# Patient Record
Sex: Female | Born: 1955 | Race: Black or African American | Hispanic: No | Marital: Married | Smoking: Current some day smoker
Health system: Southern US, Community
[De-identification: ages and names within clinical notes are randomized; demographics above are authoritative.]

## PROBLEM LIST (undated history)

## (undated) DIAGNOSIS — E785 Hyperlipidemia, unspecified: Secondary | ICD-10-CM

## (undated) DIAGNOSIS — Z8619 Personal history of other infectious and parasitic diseases: Secondary | ICD-10-CM

## (undated) DIAGNOSIS — G709 Myoneural disorder, unspecified: Secondary | ICD-10-CM

## (undated) DIAGNOSIS — E1169 Type 2 diabetes mellitus with other specified complication: Secondary | ICD-10-CM

## (undated) DIAGNOSIS — I10 Essential (primary) hypertension: Secondary | ICD-10-CM

## (undated) DIAGNOSIS — Z720 Tobacco use: Secondary | ICD-10-CM

## (undated) DIAGNOSIS — E669 Obesity, unspecified: Secondary | ICD-10-CM

## (undated) HISTORY — DX: Personal history of other infectious and parasitic diseases: Z86.19

## (undated) HISTORY — PX: CHOLECYSTECTOMY: SHX55

## (undated) HISTORY — DX: Obesity, unspecified: E66.9

## (undated) HISTORY — DX: Hyperlipidemia, unspecified: E78.5

## (undated) HISTORY — PX: TOTAL ABDOMINAL HYSTERECTOMY: SHX209

## (undated) HISTORY — PX: ABDOMINAL HYSTERECTOMY: SHX81

## (undated) HISTORY — DX: Essential (primary) hypertension: I10

## (undated) HISTORY — PX: COLONOSCOPY: SHX174

## (undated) HISTORY — DX: Tobacco use: Z72.0

## (undated) HISTORY — DX: Myoneural disorder, unspecified: G70.9

## (undated) HISTORY — PX: APPENDECTOMY: SHX54

## (undated) HISTORY — DX: Type 2 diabetes mellitus with other specified complication: E11.69

---

## 2006-12-17 ENCOUNTER — Encounter: Payer: Self-pay | Admitting: Family Medicine

## 2006-12-17 ENCOUNTER — Other Ambulatory Visit: Admission: RE | Admit: 2006-12-17 | Discharge: 2006-12-17 | Payer: Self-pay | Admitting: Obstetrics and Gynecology

## 2006-12-17 ENCOUNTER — Ambulatory Visit: Payer: Self-pay | Admitting: Family Medicine

## 2006-12-31 ENCOUNTER — Ambulatory Visit: Payer: Self-pay | Admitting: Obstetrics & Gynecology

## 2010-09-17 NOTE — Group Therapy Note (Signed)
NAME:  Tiffany Estrada, Tiffany Estrada NO.:  [id number]   MEDICAL RECORD NO.:  [id number]          PATIENT TYPE:  WOC   LOCATION:  [facility]                   FACILITY:  [facility]   PHYSICIAN:  Johnella Moloney, MD        DATE OF BIRTH:  05-28-1955   DATE OF SERVICE:                                  CLINIC NOTE   CHIEF COMPLAINT:  Abnormal uterine bleeding, fibroids, follow up of  endometrial biopsy results.   HISTORY OF PRESENT ILLNESS:  The patient is a 55 year old G2, P2 who was  last seen on 12/17/2006 by Dr. Tinnie Gens for evaluation of abnormal  uterine bleeding and iron deficiency anemia. The patient has a history  of fibroid uterus. At her last visit she was examined and found to have  an 18 week sized fibroid uterus and an endometrial biopsy and a Pap  smear were done at this visit. The patient is here today to follow up  her results and to discuss further management options. She does report  having a small amount of bleeding after the procedure but has had no  further bleeding since then. She still reports having some pelvic  pressure which causes increased sensation to urinate but this has been  stable over several months. No interval change in medical history.   On physical examination vital signs are stable. Physical examination  deferred.   RESULTS:  Pap smear done on 12/17/2006 was negative for intra-epithelial  lesions or malignancy. Endometrial biopsy done was remarkable for  degenerating secretory type endometrium with tubal metaplasia and  hemorrhage.   ASSESSMENT/PLAN:  The patient is a 55 year old G2, P2 with a history of  fibroids, symptomatic fibroid uterus and menorrhagia.   Of note, the patient also has a history of an abnormal Pap smear in  December 2007 of LGSIL but had a negative colposcopy and now her most  recent Pap smear is negative. Results were discussed with the patient  including benign endometrial biopsy and the patient was reassured by  results.  Discussed further management of her menorrhagia including  medical management with hormones versus IUD therapy and surgical  management involving a total hysterectomy and BSO. The patient at this  point is unsure about what she wants to do about the menorrhagia and  wants to think about it for a little bit before making a final decision.  Discussed both the Mirena IUD and also surgical options. Discussed all  the risks, benefits, indications and alternatives of both kind of  treatments, emphasizing the surgical risks and anesthesia risks, length  of recovery after a total abdominal hysterectomy. Also stressed that  depending on who is going to do her surgery, the surgery will probably  most likely be an open exploratory laparotomy surgery and not a  minimally invasive surgery. The patient does understand the risks of  surgery and will make another appointment to finalize her decision about  further management of her menorrhagia.           ______________________________  Johnella Moloney, MD     UD/MEDQ  D:  12/31/2006  T:  01/01/2007  Job:  [id number]

## 2010-09-17 NOTE — Group Therapy Note (Signed)
NAME:  Tiffany Estrada, Tiffany Estrada NO.:  [id number]   MEDICAL RECORD NO.:  [id number]          PATIENT TYPE:  WOC   LOCATION:  [facility]                   FACILITY:  [facility]   PHYSICIAN:  Tinnie Gens, MD        DATE OF BIRTH:  1956-03-15   DATE OF SERVICE:  12/17/2006                                  CLINIC NOTE   CHIEF COMPLAINT:  Abnormal uterine bleeding.   HISTORY OF PRESENT ILLNESS:  The patient is a 55 year old gravida 2,  para 2-0-0-2 who is referred from [facility] for abnormal uterine bleeding and iron deficiency anemia.  The  patient also has a history of fibroid uterus.  She notes that she was  told that approximately 18 years ago that she has fibroid uterus and  that they would go down after her delivery of her last child.  The  patient reports her bleeding is worse.  Her sensation of pelvic pressure  and pain has gotten more involved.  She reports that she was told that  her fibroids are growing.  She normally has monthly cycles that are  exceptionally heavy.  She is going through what she calls a pack of pads  a day.  She requires iron and multivitamin to keep her hemoglobin stable  at approximately 9.  LMP was May of 2008.  She did have some spotting in  June.   PAST MEDICAL HISTORY:  Negative.   PAST SURGICAL HISTORY:  Cholecystectomy 1999, appendectomy 1990, BTL  1992.   MEDICATIONS:  Iron and multivitamins.   ALLERGIES:  Latex, PENICILLIN, and SHELLFISH.   OBSTETRICAL HISTORY:  She is G2, P2, with 2 vaginal deliveries.   GYN HISTORY:  Menarche at age 62.  Cycles are monthly, last 5-10 days  with heavy flow and moderate pain.  Some bleeding between periods.  She  does have a history of abnormal Pap and underwent colposcopy in December  2007.  She has not had followup since that time.  The colposcopy  resulted in no biopsies as no lesions were seen.   FAMILY HISTORY:  Significant for diabetes and hypertension.   SOCIAL  HISTORY:  She does smoke approximately one-half pack per day.  No  alcohol use.  No other drug use.   A 14-point review of systems is reviewed and is diffusely positive for  swelling in the legs, muscle aches, fevers, night sweats, fatigue,  weight gain, headaches, dizzy spells, problems with hearing, nausea,  vomiting, and hot flashes.  Most of this will be covered by her primary  care physician.   EXAM:  Blood pressure is 135/88, weight is 184, a pulse 84, height 5  feet 7 inches.  She is a well-nourished female in no acute distress.  ABDOMEN:  Soft with a mass in the lower abdomen.  GU:  Normal external female genitalia.  Vagina is pink and rugated.  Cervix is parous without lesions.  Uterus is firm and approximately 18  weeks size.  Adnexa cannot be appreciated.   PROCEDURE:  Cervix is cleaned with Betadine x2, grasped anteriorly with  a single-tooth tenaculum, sounded to approximately 11 cm.  An  endometrial sampling was done.  Patient tolerated procedure well.   IMPRESSION:  1. Abnormal uterine bleeding.  2. Have a history of abnormal Pap.  3. Fibroid uterus  4. Iron deficiency anemia.   PLAN:  1. Pap smear today.  2. Endometrial sampling today.  3. Follow up in 2 weeks for results and then discussion of treatment      options, which is probably and mostly would include a TAH.  Thank      you so much for referring this patient.  We will keep you updated      as to her progress.           ______________________________  Tinnie Gens, MD     TP/MEDQ  D:  12/17/2006  T:  12/18/2006  Job:  [id number]

## 2012-03-31 ENCOUNTER — Emergency Department (HOSPITAL_BASED_OUTPATIENT_CLINIC_OR_DEPARTMENT_OTHER): Payer: Self-pay

## 2012-03-31 ENCOUNTER — Encounter (HOSPITAL_BASED_OUTPATIENT_CLINIC_OR_DEPARTMENT_OTHER): Payer: Self-pay | Admitting: *Deleted

## 2012-03-31 ENCOUNTER — Emergency Department (HOSPITAL_BASED_OUTPATIENT_CLINIC_OR_DEPARTMENT_OTHER)
Admission: EM | Admit: 2012-03-31 | Discharge: 2012-03-31 | Disposition: A | Discharge status: Other Acute Inpatient Hospital | Payer: Self-pay | Attending: Emergency Medicine | Admitting: Emergency Medicine

## 2012-03-31 DIAGNOSIS — R112 Nausea with vomiting, unspecified: Secondary | ICD-10-CM | POA: Insufficient documentation

## 2012-03-31 DIAGNOSIS — K5792 Diverticulitis of intestine, part unspecified, without perforation or abscess without bleeding: Secondary | ICD-10-CM

## 2012-03-31 DIAGNOSIS — R509 Fever, unspecified: Secondary | ICD-10-CM | POA: Insufficient documentation

## 2012-03-31 DIAGNOSIS — F17200 Nicotine dependence, unspecified, uncomplicated: Secondary | ICD-10-CM | POA: Insufficient documentation

## 2012-03-31 DIAGNOSIS — R109 Unspecified abdominal pain: Secondary | ICD-10-CM | POA: Insufficient documentation

## 2012-03-31 DIAGNOSIS — K5732 Diverticulitis of large intestine without perforation or abscess without bleeding: Secondary | ICD-10-CM | POA: Insufficient documentation

## 2012-03-31 LAB — COMPREHENSIVE METABOLIC PANEL
AST: 22 U/L (ref 0–37)
Albumin: 4 g/dL (ref 3.5–5.2)
Chloride: 98 mEq/L (ref 96–112)
Creatinine, Ser: 0.7 mg/dL (ref 0.50–1.10)
Total Bilirubin: 1.1 mg/dL (ref 0.3–1.2)
Total Protein: 8.1 g/dL (ref 6.0–8.3)

## 2012-03-31 LAB — CBC WITH DIFFERENTIAL/PLATELET
Basophils Absolute: 0 10*3/uL (ref 0.0–0.1)
Basophils Relative: 0 % (ref 0–1)
Eosinophils Absolute: 0 10*3/uL (ref 0.0–0.7)
HCT: 43.3 % (ref 36.0–46.0)
MCH: 30.5 pg (ref 26.0–34.0)
MCHC: 35.3 g/dL (ref 30.0–36.0)
Monocytes Absolute: 1.2 10*3/uL — ABNORMAL HIGH (ref 0.1–1.0)
Neutro Abs: 17.4 10*3/uL — ABNORMAL HIGH (ref 1.7–7.7)
Neutrophils Relative %: 88 % — ABNORMAL HIGH (ref 43–77)
RDW: 12.9 % (ref 11.5–15.5)

## 2012-03-31 LAB — URINALYSIS, ROUTINE W REFLEX MICROSCOPIC
Ketones, ur: 15 mg/dL — AB
Nitrite: NEGATIVE
Protein, ur: 30 mg/dL — AB

## 2012-03-31 LAB — URINE MICROSCOPIC-ADD ON

## 2012-03-31 MED ORDER — SODIUM CHLORIDE 0.9 % IV SOLN
Freq: Once | INTRAVENOUS | Status: AC
  Administered 2012-03-31: 18:00:00 via INTRAVENOUS

## 2012-03-31 MED ORDER — ACETAMINOPHEN 325 MG PO TABS
650.0000 mg | ORAL_TABLET | Freq: Once | ORAL | Status: AC
  Administered 2012-03-31: 650 mg via ORAL
  Filled 2012-03-31: qty 2

## 2012-03-31 MED ORDER — ONDANSETRON HCL 4 MG/2ML IJ SOLN
4.0000 mg | Freq: Once | INTRAMUSCULAR | Status: AC
  Administered 2012-03-31: 4 mg via INTRAVENOUS
  Filled 2012-03-31: qty 2

## 2012-03-31 MED ORDER — MORPHINE SULFATE 4 MG/ML IJ SOLN
4.0000 mg | Freq: Once | INTRAMUSCULAR | Status: AC
  Administered 2012-03-31: 4 mg via INTRAVENOUS
  Filled 2012-03-31: qty 1

## 2012-03-31 MED ORDER — MORPHINE SULFATE 4 MG/ML IJ SOLN
INTRAMUSCULAR | Status: AC
  Administered 2012-03-31: 4 mg via INTRAVENOUS
  Filled 2012-03-31: qty 1

## 2012-03-31 MED ORDER — MORPHINE SULFATE 4 MG/ML IJ SOLN
INTRAMUSCULAR | Status: AC
  Filled 2012-03-31: qty 1

## 2012-03-31 MED ORDER — IOHEXOL 300 MG/ML  SOLN
100.0000 mL | Freq: Once | INTRAMUSCULAR | Status: AC | PRN
  Administered 2012-03-31: 100 mL via INTRAVENOUS

## 2012-03-31 MED ORDER — IOHEXOL 300 MG/ML  SOLN: 25.0000 mL | INTRAMUSCULAR | Status: AC

## 2012-03-31 MED ORDER — SODIUM CHLORIDE 0.9 % IV BOLUS (SEPSIS)
1000.0000 mL | Freq: Once | INTRAVENOUS | Status: AC
  Administered 2012-03-31: 1000 mL via INTRAVENOUS

## 2012-03-31 MED ORDER — MORPHINE SULFATE 4 MG/ML IJ SOLN
4.0000 mg | Freq: Once | INTRAMUSCULAR | Status: AC
  Administered 2012-03-31: 4 mg via INTRAVENOUS

## 2012-03-31 MED ORDER — SODIUM CHLORIDE 0.9 % IV SOLN
INTRAVENOUS | Status: DC
  Administered 2012-03-31: 21:00:00 via INTRAVENOUS

## 2012-03-31 MED ORDER — METRONIDAZOLE IN NACL 5-0.79 MG/ML-% IV SOLN
500.0000 mg | Freq: Once | INTRAVENOUS | Status: AC
  Administered 2012-03-31: 500 mg via INTRAVENOUS
  Filled 2012-03-31: qty 100

## 2012-03-31 MED ORDER — CIPROFLOXACIN IN D5W 400 MG/200ML IV SOLN
400.0000 mg | Freq: Once | INTRAVENOUS | Status: AC
  Administered 2012-03-31: 400 mg via INTRAVENOUS
  Filled 2012-03-31: qty 200

## 2012-03-31 NOTE — ED Notes (Signed)
Pt to triage in w/c reporting sudden onset of left lower quad/pelvic pain, took antacids without relief. Pt denies any vag bleeding or discharge, small bm this am, normal bm yesterday.

## 2012-03-31 NOTE — ED Provider Notes (Signed)
Medical screening examination/treatment/procedure(s) were performed by non-physician practitioner and as supervising physician I was immediately available for consultation/collaboration.  Geoffery Lyons, MD 03/31/12 2104

## 2012-03-31 NOTE — ED Notes (Signed)
EDNP notified of VS-orders for NS 1000cc bolus

## 2012-03-31 NOTE — ED Provider Notes (Signed)
History     CSN: [id number]  Arrival date & time 03/31/12  1531   First MD Initiated Contact with Patient 03/31/12 1539      Chief Complaint  Patient presents with  . Pelvic Pain  . Abdominal Pain    (Consider location/radiation/quality/duration/timing/severity/associated sxs/prior treatment) HPI Comments: Pt states that she is dribling without much relief  Patient is a 56 y.o. female presenting with pelvic pain.  Pelvic Pain This is a new problem. The current episode started today. The problem occurs constantly. The problem has been unchanged. Associated symptoms include abdominal pain, a fever, nausea and vomiting. Nothing aggravates the symptoms. She has tried nothing for the symptoms.    History reviewed. No pertinent past medical history.  Past Surgical History  Procedure Date  . Abdominal hysterectomy     History reviewed. No pertinent family history.  History  Substance Use Topics  . Smoking status: Current Every Day Smoker  . Smokeless tobacco: Not on file  . Alcohol Use:     OB History    Grav Para Term Preterm Abortions TAB SAB Ect Mult Living                  Review of Systems  Constitutional: Positive for fever.  Respiratory: Negative.   Cardiovascular: Negative.   Gastrointestinal: Positive for nausea, vomiting and abdominal pain.  Genitourinary: Positive for pelvic pain.    Allergies  Penicillins and Shellfish allergy  Home Medications  No current outpatient prescriptions on file.  BP 127/74  Pulse 97  Temp 99.4 F (37.4 C) (Oral)  Resp 18  Ht 5\' 6"  (1.676 m)  Wt 194 lb (87.998 kg)  BMI 31.31 kg/m2  SpO2 100%  Physical Exam  Nursing note and vitals reviewed. Constitutional: She is oriented to person, place, and time. She appears well-developed and well-nourished.  HENT:  Head: Normocephalic and atraumatic.  Eyes: Conjunctivae normal and EOM are normal.  Neck: Neck supple.  Cardiovascular: Normal rate and regular rhythm.     Pulmonary/Chest: Effort normal and breath sounds normal.  Abdominal: Soft. Bowel sounds are normal.       llq tenderness  Musculoskeletal: Normal range of motion.  Neurological: She is alert and oriented to person, place, and time.  Skin: Skin is warm and dry.  Psychiatric: She has a normal mood and affect.    ED Course  Procedures (including critical care time)  Labs Reviewed  URINALYSIS, ROUTINE W REFLEX MICROSCOPIC - Abnormal; Notable for the following:    Color, Urine AMBER (*)  BIOCHEMICALS MAY BE AFFECTED BY COLOR   APPearance CLOUDY (*)     Bilirubin Urine SMALL (*)     Ketones, ur 15 (*)     Protein, ur 30 (*)     Leukocytes, UA SMALL (*)     All other components within normal limits  CBC WITH DIFFERENTIAL - Abnormal; Notable for the following:    WBC 19.8 (*)     Hemoglobin 15.3 (*)     Neutrophils Relative 88 (*)     Neutro Abs 17.4 (*)     Lymphocytes Relative 6 (*)     Monocytes Absolute 1.2 (*)     All other components within normal limits  COMPREHENSIVE METABOLIC PANEL - Abnormal; Notable for the following:    Glucose, Bld 146 (*)     All other components within normal limits  URINE MICROSCOPIC-ADD ON - Abnormal; Notable for the following:    Squamous Epithelial / LPF FEW (*)  All other components within normal limits  URINE CULTURE   Ct Abdomen Pelvis W Contrast  03/31/2012  *RADIOLOGY REPORT*  Clinical Data: 56 year old female with abdominal and pelvic pain. Nausea and vomiting.  CT ABDOMEN AND PELVIS WITH CONTRAST  Technique:  Multidetector CT imaging of the abdomen and pelvis was performed following the standard protocol during bolus administration of intravenous contrast.  Contrast: OMNIPAQUE IOHEXOL 300 MG/ML  SOLN  Comparison: None  Findings: Mild bibasilar atelectasis identified.  Fatty infiltration of the liver is present. The spleen, pancreas, adrenal glands and kidneys are unremarkable. The patient is status post cholecystectomy.   Circumferential focal wall thickening of the proximal sigmoid colon is identified with a length of 5 cm.  Adjacent inflammation and tiny focus of extraluminal air (image 73) noted.  Although this likely represents diverticulitis, clinical follow-up recommended to exclude other processes. There is no evidence of focal abscess or gross pneumoperitoneum.  There is no evidence of free fluid, enlarged lymph nodes, biliary dilatation or abdominal aortic aneurysm.  No acute or suspicious bony abnormalities are noted.  IMPRESSION: Sigmoid colon wall thickening with adjacent inflammation and tiny focus of extraluminal air, compatible with acute diverticulitis. No evidence of focal abscess or gross pneumoperitoneum.  Clinical follow-up is recommended to exclude other processes.  Fatty infiltration of the liver.   Original Report Authenticated By: Harmon Pier, M.D.      1. Diverticulitis       MDM  Pt to go to the [facility] at [facility]        Teressa Lower, NP 03/31/12 2049

## 2012-04-01 LAB — URINE CULTURE
Colony Count: NO GROWTH
Culture: NO GROWTH

## 2014-12-29 ENCOUNTER — Encounter (HOSPITAL_BASED_OUTPATIENT_CLINIC_OR_DEPARTMENT_OTHER): Payer: Self-pay

## 2014-12-29 ENCOUNTER — Emergency Department (HOSPITAL_BASED_OUTPATIENT_CLINIC_OR_DEPARTMENT_OTHER): Payer: BC Managed Care – PPO

## 2014-12-29 ENCOUNTER — Emergency Department (HOSPITAL_BASED_OUTPATIENT_CLINIC_OR_DEPARTMENT_OTHER)
Admission: EM | Admit: 2014-12-29 | Discharge: 2014-12-29 | Disposition: A | Discharge status: Home / Self Care | Payer: BC Managed Care – PPO | Attending: Emergency Medicine | Admitting: Emergency Medicine

## 2014-12-29 DIAGNOSIS — M25561 Pain in right knee: Secondary | ICD-10-CM

## 2014-12-29 DIAGNOSIS — S8991XA Unspecified injury of right lower leg, initial encounter: Secondary | ICD-10-CM | POA: Insufficient documentation

## 2014-12-29 DIAGNOSIS — Z720 Tobacco use: Secondary | ICD-10-CM | POA: Diagnosis not present

## 2014-12-29 DIAGNOSIS — Z880 Allergy status to penicillin: Secondary | ICD-10-CM | POA: Diagnosis not present

## 2014-12-29 DIAGNOSIS — M25461 Effusion, right knee: Secondary | ICD-10-CM

## 2014-12-29 DIAGNOSIS — Y9389 Activity, other specified: Secondary | ICD-10-CM | POA: Diagnosis not present

## 2014-12-29 DIAGNOSIS — Y998 Other external cause status: Secondary | ICD-10-CM | POA: Diagnosis not present

## 2014-12-29 DIAGNOSIS — Y92410 Unspecified street and highway as the place of occurrence of the external cause: Secondary | ICD-10-CM | POA: Diagnosis not present

## 2014-12-29 MED ORDER — NAPROXEN 500 MG PO TABS: 500.0000 mg | ORAL_TABLET | Freq: Two times a day (BID) | ORAL | Status: DC

## 2014-12-29 MED ORDER — NAPROXEN 250 MG PO TABS
500.0000 mg | ORAL_TABLET | Freq: Once | ORAL | Status: AC
  Administered 2014-12-29: 500 mg via ORAL
  Filled 2014-12-29: qty 2
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
## 2015-01-01 ENCOUNTER — Encounter: Payer: Self-pay | Admitting: Family Medicine

## 2015-01-01 ENCOUNTER — Ambulatory Visit (INDEPENDENT_AMBULATORY_CARE_PROVIDER_SITE_OTHER): Payer: BC Managed Care – PPO | Admitting: Family Medicine

## 2015-01-01 VITALS — BP 149/86 | HR 83 | Ht 66.0 in | Wt 182.0 lb

## 2015-01-01 DIAGNOSIS — S8991XA Unspecified injury of right lower leg, initial encounter: Secondary | ICD-10-CM

## 2015-01-01 MED ORDER — DICLOFENAC SODIUM 75 MG PO TBEC: 75.0000 mg | DELAYED_RELEASE_TABLET | Freq: Two times a day (BID) | ORAL | Status: DC

## 2015-01-01 MED ORDER — HYDROCODONE-ACETAMINOPHEN 5-325 MG PO TABS: 1.0000 | ORAL_TABLET | Freq: Four times a day (QID) | ORAL | Status: DC | PRN

## 2015-01-01 NOTE — Patient Instructions (Signed)
Your knee pain is likely due to patellar subluxation though contusion, synovitis are other considerations. Ice knee 15 minutes at a time 3-4 times a day. Diclofenac twice a day with food for pain and inflammation. Elevate above your heart when possible. Knee extensions and straight leg raises 3 sets of 10 once a day. Add ankle weight if these become too easy. Out of work until I see you back in 2 weeks. Knee brace or sleeve for support. Norco as needed for severe pain.

## 2015-01-03 DIAGNOSIS — S8991XA Unspecified injury of right lower leg, initial encounter: Secondary | ICD-10-CM | POA: Insufficient documentation
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
## 2015-01-03 NOTE — Assessment & Plan Note (Signed)
consistent with mild subluxation though contusion, synovitis also possible.  Shown home exercises to do regularly.  Icing, diclofenac, knee brace.  Norco as needed for pain.  F/u in 2 weeks.  Out of work in meantime.

## 2015-01-09 ENCOUNTER — Ambulatory Visit (INDEPENDENT_AMBULATORY_CARE_PROVIDER_SITE_OTHER): Payer: BC Managed Care – PPO | Admitting: Family Medicine

## 2015-01-09 ENCOUNTER — Encounter: Payer: Self-pay | Admitting: Family Medicine

## 2015-01-09 VITALS — BP 187/110 | HR 72 | Temp 98.1°F | Ht 66.0 in | Wt 182.0 lb

## 2015-01-09 DIAGNOSIS — S8991XA Unspecified injury of right lower leg, initial encounter: Secondary | ICD-10-CM | POA: Diagnosis not present

## 2015-01-09 NOTE — Patient Instructions (Signed)
We will go ahead with an MRI to further assess. Continue with icing, knee brace, diclofenac with norco as needed. I will contact you the business day following the MRI to go over results and next steps.
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
## 2015-01-15 NOTE — Assessment & Plan Note (Signed)
consistent with subluxation though having significant pain and no evidence of infection.  Will go ahead with MRI to further assess.

## 2015-01-19 ENCOUNTER — Ambulatory Visit (INDEPENDENT_AMBULATORY_CARE_PROVIDER_SITE_OTHER): Payer: BC Managed Care – PPO | Admitting: Family Medicine

## 2015-01-19 ENCOUNTER — Encounter: Payer: Self-pay | Admitting: Family Medicine

## 2015-01-19 VITALS — BP 189/107 | HR 74 | Ht 66.0 in | Wt 182.0 lb

## 2015-01-19 DIAGNOSIS — S8991XD Unspecified injury of right lower leg, subsequent encounter: Secondary | ICD-10-CM

## 2015-01-19 NOTE — Patient Instructions (Signed)
Continue with the knee sleeve, ibuprofen as you have been. Do straight leg raises, straight leg raises with foot turned outwards, side hip raises, and knee extensions most days of the week 3 sets of 10. Add ankle weight if these become too easy. Icing 15 minutes at a time 3-4 times a day including after work. Consider a knee pad if you're going to be kneeling at any time in the future. Follow up with me in 4 weeks.

## 2015-01-23 NOTE — Assessment & Plan Note (Signed)
consistent with subluxation and improving since last visit.  Will cancel MRI.  Continue with HEP, knee sleeve, ibuprofen, icing.  F/u in 4 weeks.  Return to work on Monday full duty.
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
## 2015-02-16 ENCOUNTER — Ambulatory Visit: Payer: Self-pay | Admitting: Family Medicine

## 2015-02-22 ENCOUNTER — Ambulatory Visit: Payer: Self-pay | Admitting: Family Medicine

## 2015-09-03 LAB — HM MAMMOGRAPHY: HM Mammogram: NORMAL (ref 0–4)

## 2016-01-26 ENCOUNTER — Encounter (HOSPITAL_BASED_OUTPATIENT_CLINIC_OR_DEPARTMENT_OTHER): Payer: Self-pay | Admitting: *Deleted

## 2016-01-26 ENCOUNTER — Emergency Department (HOSPITAL_BASED_OUTPATIENT_CLINIC_OR_DEPARTMENT_OTHER): Payer: Worker's Compensation

## 2016-01-26 ENCOUNTER — Emergency Department (HOSPITAL_BASED_OUTPATIENT_CLINIC_OR_DEPARTMENT_OTHER)
Admission: EM | Admit: 2016-01-26 | Discharge: 2016-01-26 | Disposition: A | Discharge status: Home / Self Care | Payer: Worker's Compensation | Attending: Emergency Medicine | Admitting: Emergency Medicine

## 2016-01-26 DIAGNOSIS — S022XXA Fracture of nasal bones, initial encounter for closed fracture: Secondary | ICD-10-CM

## 2016-01-26 DIAGNOSIS — I10 Essential (primary) hypertension: Secondary | ICD-10-CM | POA: Diagnosis not present

## 2016-01-26 DIAGNOSIS — Y92811 Bus as the place of occurrence of the external cause: Secondary | ICD-10-CM | POA: Insufficient documentation

## 2016-01-26 DIAGNOSIS — W500XXA Accidental hit or strike by another person, initial encounter: Secondary | ICD-10-CM | POA: Insufficient documentation

## 2016-01-26 DIAGNOSIS — S0993XA Unspecified injury of face, initial encounter: Secondary | ICD-10-CM | POA: Diagnosis present

## 2016-01-26 DIAGNOSIS — Y999 Unspecified external cause status: Secondary | ICD-10-CM | POA: Diagnosis not present

## 2016-01-26 DIAGNOSIS — S0033XA Contusion of nose, initial encounter: Secondary | ICD-10-CM

## 2016-01-26 DIAGNOSIS — F17210 Nicotine dependence, cigarettes, uncomplicated: Secondary | ICD-10-CM | POA: Diagnosis not present

## 2016-01-26 DIAGNOSIS — Y939 Activity, unspecified: Secondary | ICD-10-CM | POA: Diagnosis not present

## 2016-01-26 MED ORDER — ACETAMINOPHEN 500 MG PO TABS: 1000.0000 mg | ORAL_TABLET | Freq: Four times a day (QID) | ORAL | 0 refills | Status: DC | PRN

## 2016-01-26 MED ORDER — TRAMADOL HCL 50 MG PO TABS: 100.0000 mg | ORAL_TABLET | Freq: Four times a day (QID) | ORAL | 0 refills | Status: DC | PRN

## 2016-01-26 NOTE — ED Notes (Signed)
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  OK to give note to be off of work for two days.
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
## 2016-01-26 NOTE — ED Notes (Signed)
MD at bedside. 
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
## 2016-01-28 MED FILL — traMADol HCL 50 MG TABS: 50 | 3 days supply | Qty: 20 | Fill #0

## 2016-01-28 MED FILL — MAPAP 500 MG CAPLET: 500 | 13 days supply | Qty: 100 | Fill #0

## 2016-02-04 ENCOUNTER — Encounter: Payer: Self-pay | Admitting: Family Medicine

## 2016-02-04 ENCOUNTER — Ambulatory Visit (INDEPENDENT_AMBULATORY_CARE_PROVIDER_SITE_OTHER): Payer: BC Managed Care – PPO | Admitting: Family Medicine

## 2016-02-04 ENCOUNTER — Telehealth: Payer: Self-pay | Admitting: Family Medicine

## 2016-02-04 VITALS — BP 160/90 | HR 70 | Temp 98.7°F | Ht 66.0 in | Wt 187.2 lb

## 2016-02-04 DIAGNOSIS — Z2821 Immunization not carried out because of patient refusal: Secondary | ICD-10-CM | POA: Diagnosis not present

## 2016-02-04 DIAGNOSIS — Z720 Tobacco use: Secondary | ICD-10-CM

## 2016-02-04 DIAGNOSIS — Z91199 Patient's noncompliance with other medical treatment and regimen due to unspecified reason: Secondary | ICD-10-CM

## 2016-02-04 DIAGNOSIS — R011 Cardiac murmur, unspecified: Secondary | ICD-10-CM

## 2016-02-04 DIAGNOSIS — Z6830 Body mass index (BMI) 30.0-30.9, adult: Secondary | ICD-10-CM

## 2016-02-04 DIAGNOSIS — E669 Obesity, unspecified: Secondary | ICD-10-CM

## 2016-02-04 DIAGNOSIS — E6609 Other obesity due to excess calories: Secondary | ICD-10-CM

## 2016-02-04 DIAGNOSIS — I10 Essential (primary) hypertension: Secondary | ICD-10-CM | POA: Diagnosis not present

## 2016-02-04 DIAGNOSIS — Z5329 Procedure and treatment not carried out because of patient's decision for other reasons: Secondary | ICD-10-CM

## 2016-02-04 HISTORY — DX: Essential (primary) hypertension: I10

## 2016-02-04 HISTORY — DX: Obesity, unspecified: E66.9

## 2016-02-04 LAB — COMPREHENSIVE METABOLIC PANEL
ALBUMIN: 4 g/dL (ref 3.5–5.2)
ALT: 23 U/L (ref 0–35)
AST: 20 U/L (ref 0–37)
Alkaline Phosphatase: 78 U/L (ref 39–117)
BUN: 7 mg/dL (ref 6–23)
CHLORIDE: 106 meq/L (ref 96–112)
CO2: 28 mEq/L (ref 19–32)
CREATININE: 0.64 mg/dL (ref 0.40–1.20)
Calcium: 9.5 mg/dL (ref 8.4–10.5)
GFR: 122.66 mL/min (ref >60.00)
GLUCOSE: 137 mg/dL — AB (ref 70–99)
POTASSIUM: 4 meq/L (ref 3.5–5.1)
SODIUM: 140 meq/L (ref 135–145)
Total Bilirubin: 0.4 mg/dL (ref 0.2–1.2)
Total Protein: 7.8 g/dL (ref 6.0–8.3)

## 2016-02-04 LAB — MICROALBUMIN / CREATININE URINE RATIO
CREATININE, U: 43.4 mg/dL
MICROALB UR: 0.8 mg/dL (ref 0.0–1.9)
Microalb Creat Ratio: 1.8 mg/g (ref 0.0–30.0)

## 2016-02-04 LAB — LIPID PANEL
CHOL/HDL RATIO: 4
CHOLESTEROL: 183 mg/dL (ref 0–200)
HDL: 47.1 mg/dL (ref >39.00)
LDL CALC: 123 mg/dL — AB (ref 0–99)
NONHDL: 136.28
Triglycerides: 68 mg/dL (ref 0.0–149.0)
VLDL: 13.6 mg/dL (ref 0.0–40.0)

## 2016-02-04 LAB — HEMOGLOBIN A1C: HEMOGLOBIN A1C: 7.6 % — AB (ref 4.6–6.5)

## 2016-02-04 NOTE — Progress Notes (Signed)
Pre visit review using our clinic review tool, if applicable. No additional management support is needed unless otherwise documented below in the visit note. 

## 2016-02-04 NOTE — Patient Instructions (Addendum)
Smoking Cessation, Tips for Success If you are ready to quit smoking, congratulations! You have chosen to help yourself be healthier. Cigarettes bring nicotine, tar, carbon monoxide, and other irritants into your body. Your lungs, heart, and blood vessels will be able to work better without these poisons. There are many different ways to quit smoking. Nicotine gum, nicotine patches, a nicotine inhaler, or nicotine nasal spray can help with physical craving. Hypnosis, support groups, and medicines help break the habit of smoking. WHAT THINGS CAN I DO TO MAKE QUITTING EASIER?  Here are some tips to help you quit for good:  Pick a date when you will quit smoking completely. Tell all of your friends and family about your plan to quit on that date.  Do not try to slowly cut down on the number of cigarettes you are smoking. Pick a quit date and quit smoking completely starting on that day.  Throw away all cigarettes.   Clean and remove all ashtrays from your home, work, and car.  On a card, write down your reasons for quitting. Carry the card with you and read it when you get the urge to smoke.  Cleanse your body of nicotine. Drink enough water and fluids to keep your urine clear or pale yellow. Do this after quitting to flush the nicotine from your body.  Learn to predict your moods. Do not let a bad situation be your excuse to have a cigarette. Some situations in your life might tempt you into wanting a cigarette.  Never have "just one" cigarette. It leads to wanting another and another. Remind yourself of your decision to quit.  Change habits associated with smoking. If you smoked while driving or when feeling stressed, try other activities to replace smoking. Stand up when drinking your coffee. Brush your teeth after eating. Sit in a different chair when you read the paper. Avoid alcohol while trying to quit, and try to drink fewer caffeinated beverages. Alcohol and caffeine may urge you to  smoke.  Avoid foods and drinks that can trigger a desire to smoke, such as sugary or spicy foods and alcohol.  Ask people who smoke not to smoke around you.  Have something planned to do right after eating or having a cup of coffee. For example, plan to take a walk or exercise.  Try a relaxation exercise to calm you down and decrease your stress. Remember, you may be tense and nervous for the first 2 weeks after you quit, but this will pass.  Find new activities to keep your hands busy. Play with a pen, coin, or rubber band. Doodle or draw things on paper.  Brush your teeth right after eating. This will help cut down on the craving for the taste of tobacco after meals. You can also try mouthwash.   Use oral substitutes in place of cigarettes. Try using lemon drops, carrots, cinnamon sticks, or chewing gum. Keep them handy so they are available when you have the urge to smoke.  When you have the urge to smoke, try deep breathing.  Designate your home as a nonsmoking area.  If you are a heavy smoker, ask your health care provider about a prescription for nicotine chewing gum. It can ease your withdrawal from nicotine.  Reward yourself. Set aside the cigarette money you save and buy yourself something nice.  Look for support from others. Join a support group or smoking cessation program. Ask someone at home or at work to help you with your plan   to quit smoking.  Always ask yourself, "Do I need this cigarette or is this just a reflex?" Tell yourself, "Today, I choose not to smoke," or "I do not want to smoke." You are reminding yourself of your decision to quit.  Do not replace cigarette smoking with electronic cigarettes (commonly called e-cigarettes). The safety of e-cigarettes is unknown, and some may contain harmful chemicals.  If you relapse, do not give up! Plan ahead and think about what you will do the next time you get the urge to smoke. HOW WILL I FEEL WHEN I QUIT SMOKING? You  may have symptoms of withdrawal because your body is used to nicotine (the addictive substance in cigarettes). You may crave cigarettes, be irritable, feel very hungry, cough often, get headaches, or have difficulty concentrating. The withdrawal symptoms are only temporary. They are strongest when you first quit but will go away within 10-14 days. When withdrawal symptoms occur, stay in control. Think about your reasons for quitting. Remind yourself that these are signs that your body is healing and getting used to being without cigarettes. Remember that withdrawal symptoms are easier to treat than the major diseases that smoking can cause.  Even after the withdrawal is over, expect periodic urges to smoke. However, these cravings are generally short lived and will go away whether you smoke or not. Do not smoke! WHAT RESOURCES ARE AVAILABLE TO HELP ME QUIT SMOKING? Your health care provider can direct you to community resources or hospitals for support, which may include:  Group support.  Education.  Hypnosis.  Therapy.   This information is not intended to replace advice given to you by your health care provider. Make sure you discuss any questions you have with your health care provider.   Document Released: 01/18/2004 Document Revised: 05/12/2014 Document Reviewed: 10/07/2012 Elsevier Interactive Patient Education 2016 Elsevier Inc.  

## 2016-02-04 NOTE — Telephone Encounter (Signed)
Pt called in to speak with cma. She would like a call back at:    [phone]

## 2016-02-04 NOTE — Progress Notes (Addendum)
Chief Complaint  Patient presents with  . Establish Care    ER follow-up for fx nose on (01/26/16),also BP check       New Patient Visit SUBJECTIVE: HPI: Tiffany Estrada is an 60 y.o.female who is being seen for establishing care.  The patient was previously seen with [facility].   Mammo-Normal mammogram in June, 2017 Hysterectomy 2/2 fibroids  Hypertension Patient presents for hypertension follow up. She does monitor home blood pressures. Blood pressures ranging 130-140's/90's is a typical reading. She is not on any anti-hypertensive medication. Patient has these side effects of medication: none She specifically denies headache, visual changes, chest pain, palpitations, dyspnea, orthopnea, PND or peripheral edema. She is adhering to a low sodium and low fat diet. She exercises with walking.  Smokes 2-3 cigarettes daily. Has never had a pneumonia vaccine as an adult. No interest.   Allergies  Allergen Reactions  . Latex   . Penicillins   . Red Dye   . Shellfish Allergy     Past Medical History:  Diagnosis Date  . Essential hypertension 02/04/2016  . History of chicken pox   . Obesity 02/04/2016   Past Surgical History:  Procedure Laterality Date  . ABDOMINAL HYSTERECTOMY    . APPENDECTOMY    . CHOLECYSTECTOMY     Social History   Social History  . Marital status: Married   Social History Main Topics  . Smoking status: Current Every Day Smoker    Types: Cigarettes  . Smokeless tobacco: Never Used  . Alcohol use No  . Drug use: No   Family History  Problem Relation Age of Onset  . Pancreatitis Mother   . Diabetes Mother   . Pancreatitis Sister   . Pancreatitis Maternal Aunt   . Pancreatitis Maternal Uncle      Current Outpatient Prescriptions:  .  acetaminophen (TYLENOL) 500 MG tablet, Take 2 tablets (1,000 mg total) by mouth every 6 (six) hours as needed., Disp: 30 tablet, Rfl: 0 .  traMADol (ULTRAM) 50 MG tablet, Take 2 tablets (100 mg total) by  mouth every 6 (six) hours as needed. 1-2 tablets every 6 hours as needed, Disp: 20 tablet, Rfl: 0  No LMP recorded. Patient has had a hysterectomy.  ROS Cardiovascular: Denies chest pain  Respiratory: Denies dyspnea   OBJECTIVE: BP (!) 160/90 (BP Location: Left Arm, Patient Position: Sitting, Cuff Size: Large)   Pulse 70   Temp 98.7 F (37.1 C) (Oral)   Ht 5\' 6"  (1.676 m)   Wt 187 lb 3.2 oz (84.9 kg)   SpO2 97%   BMI 30.21 kg/m   Constitutional: -  VS reviewed -  Well developed, well nourished, appears stated age -  No apparent distress  Psychiatric: -  Oriented to person, place, and time -  Memory intact -  Affect and mood normal -  Fluent conversation, good eye contact -  Judgment and insight age appropriate  Eye: -  Conjunctivae clear, no discharge -  Pupils symmetric, round, reactive to light  ENMT: -  Ears are 40% occluded on R, 20% occluded on L with cerumen without erythema or discharge. TM's are shiny and clear b/l without evidence of effusion or infection. -  Oral mucosa without lesions, tongue and uvula midline    Tonsils not enlarged, no erythema, no exudate, trachea midline    Pharynx moist, no lesions, no erythema -  Poor dentition  Neck: -  No gross swelling, no palpable masses -  Thyroid midline,  not enlarged, mobile, no palpable masses  Cardiovascular: -  RRR, 2/6 SEM heard loudest at aortic listening post. -  No LE edema  Respiratory: -  Normal respiratory effort, no accessory muscle use, no retraction -  Breath sounds equal, no wheezes, no ronchi, no crackles  Skin: -  No significant lesion on inspection -  Warm and dry to palpation   ASSESSMENT/PLAN: Essential hypertension - Plan: Comprehensive metabolic panel, EKG [id number], Microalbumin / creatinine urine ratio  Class 1 obesity due to excess calories without serious comorbidity with body mass index (BMI) of 30.0 to 30.9 in adult - Plan: Hemoglobin A1c, Lipid panel  Heart murmur  Tobacco  abuse  Refused influenza vaccine  Refused pneumococcal vaccination  Noncompliance by refusing service  Patient instructed to sign release of records form from her previous PCP. New dx of hypertension prompting EKG. No signs of ischemia or LVH. Good R wave progression. Offered to do nothing, try for lifestyle changes or start medication. She opted for the 2nd option. Heart murmur new to her, she is asymptomatic.  Counseled on smoking cessation- written information given to help.  She is refusing HIV and Hep C screening. Patient should return in 4 weeks. If she has not lost weight or made significant changes, will recommend medication, likely Lisinopril. Irregardless of her cholesterol, she will likely need a statin with all of her other risk factors. The patient voiced understanding and agreement to the plan.   [place], DO 02/04/16  11:25 AM

## 2016-02-07 NOTE — Telephone Encounter (Signed)
Spoke with the pt and she stated that she has not felt good today.  She has been dizzy and having nausea and vomiting.   Pt stated that she needs to be seen.  Pt was scheduled an appt for (Friday-02/08/16 @ 7:15am).//AB/CMA

## 2016-02-07 NOTE — Telephone Encounter (Signed)
Called and [facility] @ 7:38am @ [phone]) asking the pt to RTC regarding work and labs.//AB/CMA

## 2016-02-08 ENCOUNTER — Ambulatory Visit (INDEPENDENT_AMBULATORY_CARE_PROVIDER_SITE_OTHER): Payer: BC Managed Care – PPO | Admitting: Family Medicine

## 2016-02-08 ENCOUNTER — Encounter: Payer: Self-pay | Admitting: Family Medicine

## 2016-02-08 VITALS — BP 150/90 | HR 71 | Temp 98.2°F | Ht 66.0 in | Wt 185.4 lb

## 2016-02-08 DIAGNOSIS — I10 Essential (primary) hypertension: Secondary | ICD-10-CM | POA: Diagnosis not present

## 2016-02-08 DIAGNOSIS — Z9189 Other specified personal risk factors, not elsewhere classified: Secondary | ICD-10-CM | POA: Diagnosis not present

## 2016-02-08 DIAGNOSIS — R42 Dizziness and giddiness: Secondary | ICD-10-CM | POA: Diagnosis not present

## 2016-02-08 DIAGNOSIS — R0789 Other chest pain: Secondary | ICD-10-CM | POA: Diagnosis not present

## 2016-02-08 DIAGNOSIS — R112 Nausea with vomiting, unspecified: Secondary | ICD-10-CM

## 2016-02-08 LAB — BASIC METABOLIC PANEL
BUN: 7 mg/dL (ref 6–23)
CALCIUM: 10.1 mg/dL (ref 8.4–10.5)
CO2: 28 mEq/L (ref 19–32)
Chloride: 103 mEq/L (ref 96–112)
Creatinine, Ser: 0.74 mg/dL (ref 0.40–1.20)
GFR: 103.74 mL/min (ref >60.00)
GLUCOSE: 196 mg/dL — AB (ref 70–99)
Potassium: 3.9 mEq/L (ref 3.5–5.1)
SODIUM: 139 meq/L (ref 135–145)

## 2016-02-08 MED ORDER — ONDANSETRON 4 MG PO TBDP: 4.0000 mg | ORAL_TABLET | Freq: Three times a day (TID) | ORAL | 0 refills | Status: DC | PRN

## 2016-02-08 MED ORDER — MECLIZINE HCL 12.5 MG PO TABS: 25.0000 mg | ORAL_TABLET | Freq: Three times a day (TID) | ORAL | 0 refills | Status: DC | PRN

## 2016-02-08 MED ORDER — ATORVASTATIN CALCIUM 40 MG PO TABS: 40.0000 mg | ORAL_TABLET | Freq: Every day | ORAL | 3 refills | Status: DC

## 2016-02-08 MED ORDER — LISINOPRIL 20 MG PO TABS: 20.0000 mg | ORAL_TABLET | Freq: Every day | ORAL | 0 refills | Status: DC

## 2016-02-08 MED FILL — ATORVASTATIN 40 MG TABLET: 40 | 90 days supply | Qty: 90 | Fill #0

## 2016-02-08 MED FILL — MECLIZINE 25 MG TABLET: 25 | 10 days supply | Qty: 30 | Fill #0

## 2016-02-08 MED FILL — ONDANSETRON ODT 4 MG TABLET: 4 | 5 days supply | Qty: 18 | Fill #0

## 2016-02-08 MED FILL — LISINOPRIL 20 MG TABLET: 20 | 30 days supply | Qty: 30 | Fill #0

## 2016-02-08 NOTE — Progress Notes (Signed)
Pre visit review using our clinic review tool, if applicable. No additional management support is needed unless otherwise documented below in the visit note. 

## 2016-02-08 NOTE — Progress Notes (Signed)
Chief Complaint  Patient presents with  . Follow-up    on BP   . Dizziness    started on WED,along with N/worse when she holds her head down.    Subjective: Patient is a 60 y.o. female here for lightheadedness.  Started 2 days ago, associated with N/V. Feels lightheaded, some spinning, lasting a couple min. Worse when she moves. +nasal congestion, headache. No numbness, tingling, vision changes or weakness.  Hypertension Patient presents for hypertension follow up. She does not monitor home blood pressures. She is not on any medications. Denies CP, SOB; does admit to intermittent chest tightness without associated symptoms. Worse when she lies down, intermittent. No hx of GERD.  She is adhering to a low sodium and low fat diet. She exercises intermittently- walking. Daily smoker Cannot work with elevated BP.   ROS: Neuro: As noted in HPI Abd: +N/V, no diarrhea or pain  Family History  Problem Relation Age of Onset  . Pancreatitis Mother   . Diabetes Mother   . Pancreatitis Sister   . Pancreatitis Maternal Aunt   . Pancreatitis Maternal Uncle    Past Medical History:  Diagnosis Date  . Essential hypertension 02/04/2016  . History of chicken pox   . Obesity 02/04/2016   Allergies  Allergen Reactions  . Latex   . Penicillins   . Red Dye   . Shellfish Allergy     Current Outpatient Prescriptions:  .  acetaminophen (TYLENOL) 500 MG tablet, Take 2 tablets (1,000 mg total) by mouth every 6 (six) hours as needed., Disp: 30 tablet, Rfl: 0 .  Multiple Vitamins-Minerals (MULTI ADULT GUMMIES PO), Take by mouth. Take 1 gummies by mouth daily., Disp: , Rfl:  .  traMADol (ULTRAM) 50 MG tablet, Take 2 tablets (100 mg total) by mouth every 6 (six) hours as needed. 1-2 tablets every 6 hours as needed, Disp: 20 tablet, Rfl: 0 .  atorvastatin (LIPITOR) 40 MG tablet, Take 1 tablet (40 mg total) by mouth daily., Disp: 90 tablet, Rfl: 3 .  lisinopril (PRINIVIL,ZESTRIL) 20 MG tablet,  Take 1 tablet (20 mg total) by mouth daily., Disp: 30 tablet, Rfl: 0 .  meclizine (ANTIVERT) 12.5 MG tablet, Take 2 tablets (25 mg total) by mouth 3 (three) times daily as needed for dizziness., Disp: 30 tablet, Rfl: 0 .  ondansetron (ZOFRAN-ODT) 4 MG disintegrating tablet, Take 1 tablet (4 mg total) by mouth every 8 (eight) hours as needed for nausea or vomiting., Disp: 20 tablet, Rfl: 0  Objective: BP (!) 150/90 (BP Location: Left Arm, Patient Position: Sitting, Cuff Size: Normal)   Pulse 71   Temp 98.2 F (36.8 C) (Oral)   Ht 5\' 6"  (1.676 m)   Wt 185 lb 6.4 oz (84.1 kg)   SpO2 98%   BMI 29.92 kg/m  General: Awake, appears stated age HEENT: MMM, EOMi, nares patent, canals obstructed with cerumen,  Heart: RRR, no murmurs, no LE edema Lungs: CTAB, no rales, wheezes or rhonchi. Normal effort Neuro: 1/4 biceps, patellar and calcaneal reflex b/l wo clonus, no cerebellar signs, [employer] positive on th e MSK: Gait normal Psych: Age appropriate judgment and insight, normal affect and mood  Assessment and Plan: Essential hypertension - Plan: lisinopril (PRINIVIL,ZESTRIL) 20 MG tablet, Basic Metabolic Panel (BMET)  10 year risk of MI or stroke 7.5% or greater - Plan: atorvastatin (LIPITOR) 40 MG tablet  Chest tightness - Plan: ECHOCARDIOGRAM STRESS TEST  Lightheadedness - Plan: meclizine (ANTIVERT) 12.5 MG tablet  Nausea and  vomiting, intractability of vomiting not specified, unspecified vomiting type - Plan: ondansetron (ZOFRAN-ODT) 4 MG disintegrating tablet  Orders as above. Start medicine and check K and Cr. F/u in 1 week to recheck BP and labs. 10 year CVD >10%, needs ASA and statin. Chest tightness likely related to GERD vs anxiety. Given her CVD risk and years of uncontrolled HTN, will r/o ischemia.  BPPV likely given exam and symptoms, tx symptomatically as above. If fails to improve, will reevaluate and consider vestibular rehab vs home Epley maneuvers. The patient voiced  understanding and agreement to the plan.  [place], DO 02/08/16  8:09 AM

## 2016-02-14 ENCOUNTER — Telehealth: Payer: Self-pay | Admitting: Family Medicine

## 2016-02-14 ENCOUNTER — Encounter: Payer: Self-pay | Admitting: Family Medicine

## 2016-02-14 ENCOUNTER — Ambulatory Visit (INDEPENDENT_AMBULATORY_CARE_PROVIDER_SITE_OTHER): Payer: BC Managed Care – PPO | Admitting: Family Medicine

## 2016-02-14 VITALS — BP 136/80 | HR 64 | Temp 98.1°F | Ht 66.0 in | Wt 187.8 lb

## 2016-02-14 DIAGNOSIS — Z720 Tobacco use: Secondary | ICD-10-CM

## 2016-02-14 DIAGNOSIS — E6609 Other obesity due to excess calories: Secondary | ICD-10-CM | POA: Diagnosis not present

## 2016-02-14 DIAGNOSIS — I10 Essential (primary) hypertension: Secondary | ICD-10-CM | POA: Diagnosis not present

## 2016-02-14 DIAGNOSIS — E669 Obesity, unspecified: Secondary | ICD-10-CM | POA: Insufficient documentation

## 2016-02-14 DIAGNOSIS — E1169 Type 2 diabetes mellitus with other specified complication: Secondary | ICD-10-CM | POA: Insufficient documentation

## 2016-02-14 DIAGNOSIS — Z6830 Body mass index (BMI) 30.0-30.9, adult: Secondary | ICD-10-CM | POA: Diagnosis not present

## 2016-02-14 HISTORY — DX: Tobacco use: Z72.0

## 2016-02-14 HISTORY — DX: Type 2 diabetes mellitus with other specified complication: E66.9

## 2016-02-14 HISTORY — DX: Type 2 diabetes mellitus with other specified complication: E11.69

## 2016-02-14 NOTE — Telephone Encounter (Signed)
Pt called in to speak with nurse/cma about a Rx.    CB: [id number]

## 2016-02-14 NOTE — Progress Notes (Signed)
Chief Complaint  Patient presents with  . Follow-up    on BP    Subjective Tiffany Estrada is a 60 y.o. female who presents for hypertension follow up. She does monitor home blood pressures. Blood pressures run around 140's over 80's. She does not relax her arm when she checks it.  She is compliant with medications. Patient has these side effects of medication: none She is not adhering to a low sodium and low fat diet. Current exercise: situps, stretching, walking; every day for around 30-60 minutes daily   Still smoking.    Past Medical History:  Diagnosis Date  . Diabetes mellitus type 2 in obese ([place]) 02/14/2016  . Essential hypertension 02/04/2016  . History of chicken pox   . Obesity 02/04/2016  . Tobacco abuse 02/14/2016   Family History  Problem Relation Age of Onset  . Pancreatitis Mother   . Diabetes Mother   . Pancreatitis Sister   . Pancreatitis Maternal Aunt   . Pancreatitis Maternal Uncle      Medications Current Outpatient Prescriptions on File Prior to Visit  Medication Sig Dispense Refill  . acetaminophen (TYLENOL) 500 MG tablet Take 2 tablets (1,000 mg total) by mouth every 6 (six) hours as needed. 30 tablet 0  . atorvastatin (LIPITOR) 40 MG tablet Take 1 tablet (40 mg total) by mouth daily. 90 tablet 3  . lisinopril (PRINIVIL,ZESTRIL) 20 MG tablet Take 1 tablet (20 mg total) by mouth daily. 30 tablet 0  . Multiple Vitamins-Minerals (MULTI ADULT GUMMIES PO) Take by mouth. Take 1 gummies by mouth daily.    . ondansetron (ZOFRAN-ODT) 4 MG disintegrating tablet Take 1 tablet (4 mg total) by mouth every 8 (eight) hours as needed for nausea or vomiting. 20 tablet 0  . traMADol (ULTRAM) 50 MG tablet Take 2 tablets (100 mg total) by mouth every 6 (six) hours as needed. 1-2 tablets every 6 hours as needed 20 tablet 0  . meclizine (ANTIVERT) 12.5 MG tablet Take 2 tablets (25 mg total) by mouth 3 (three) times daily as needed for dizziness. 30 tablet 0    Allergies Allergies  Allergen Reactions  . Latex   . Penicillins   . Red Dye   . Shellfish Allergy     Review of Systems Eye:  no recent significant change in vision Cardiovascular:  no exercise intolerance, no current chest pain, no palpitations Respiratory:  No shortness of breath  Exam BP 136/80 (BP Location: Left Arm, Patient Position: Sitting, Cuff Size: Normal)   Pulse 64   Temp 98.1 F (36.7 C) (Oral)   Ht 5\' 6"  (1.676 m)   Wt 187 lb 12.8 oz (85.2 kg)   SpO2 97%   BMI 30.31 kg/m  General:  well developed, well nourished, in no apparent distress Skin:  warm, no pallor or diaphoresis Eyes:  pupils equal and round, sclera anicteric without injection Neck: neck supple without adenopathy, thyromegaly, masses, or bruits  Lungs:  clear to auscultation, breath sounds equal bilaterally Cardio:  regular rate and rhythm without murmurs, heart sounds without clicks or rubs Musculoskeletal:  symmetrical muscle groups noted without atrophy or deformity Psych: well oriented with normal range of affect and appropriate judgment/insight  Essential hypertension  Class 1 obesity due to excess calories without serious comorbidity with body mass index (BMI) of 30.0 to 30.9 in adult  Tobacco abuse  Orders as above. Counseled on ideal way to check her blood pressure at home. She needs to stop smoking. I want her  to add pushups and squatting to her workout regimen. Also recommended yoga. F/u as originally planned at the end of the month-we'll discuss her new diagnosis of diabetes. The patient voiced understanding and agreement to the plan.  [place], DO 02/14/16  8:41 AM

## 2016-02-14 NOTE — Addendum Note (Signed)
Addended by: Harl Bowie on: 02/14/2016 10:49 AM   Modules accepted: Orders

## 2016-02-14 NOTE — Patient Instructions (Addendum)
Start squatting and doing pushups. Do one exercise on one day and do the other one on the following. Yoga is always good.  Stop smoking!!  Make sure your wrist is at heart level and your arm is totally relaxed when you check your blood pressure.

## 2016-02-14 NOTE — Telephone Encounter (Signed)
Called and spoke with the pt and she wanted to let me know the correct strength on the Meclizine.  Pt stated that she is taking the Meclizine 25mg  tablet by mouth tid prn.  Correction was made on the pt's medication list.//AB/CMA

## 2016-02-14 NOTE — Progress Notes (Signed)
Pre visit review using our clinic review tool, if applicable. No additional management support is needed unless otherwise documented below in the visit note. 

## 2016-02-15 ENCOUNTER — Ambulatory Visit (HOSPITAL_COMMUNITY)
Admission: RE | Admit: 2016-02-15 | Discharge: 2016-02-15 | Disposition: A | Discharge status: Home / Self Care | Payer: Worker's Compensation | Source: Ambulatory Visit | Attending: Family Medicine | Admitting: Family Medicine

## 2016-02-15 ENCOUNTER — Encounter (HOSPITAL_COMMUNITY): Payer: Self-pay

## 2016-02-15 ENCOUNTER — Telehealth: Payer: Self-pay | Admitting: Family Medicine

## 2016-02-15 DIAGNOSIS — R0789 Other chest pain: Secondary | ICD-10-CM

## 2016-02-15 NOTE — Telephone Encounter (Signed)
Caller name:Tiffany Relationship to patient:Cardiology Can be [phone] Pharmacy:  Reason for call: Per Tiffany with Cardiology, stress echo can not be ordered without Cardiology consult first. You can order just a echocardiogram, please advise.

## 2016-02-18 NOTE — Telephone Encounter (Signed)
Am I able to order a stress EKG without a consult? TY.

## 2016-02-18 NOTE — Telephone Encounter (Signed)
Please schedule pt with me for a dedicated visit for her chest tightness. Thanks.

## 2016-02-18 NOTE — Telephone Encounter (Signed)
Patient would need consult first

## 2016-02-18 NOTE — Telephone Encounter (Signed)
Called and spoke with the pt and informed her of the note below.   Pt verbalized understanding and agreed.  Pt was scheduled an appt for (Wed-02/20/16 @ 10:15pm).//AB/CMA

## 2016-02-20 ENCOUNTER — Ambulatory Visit (INDEPENDENT_AMBULATORY_CARE_PROVIDER_SITE_OTHER): Payer: BC Managed Care – PPO | Admitting: Family Medicine

## 2016-02-20 VITALS — BP 136/88 | HR 72 | Temp 98.1°F | Ht 66.0 in | Wt 195.4 lb

## 2016-02-20 DIAGNOSIS — K219 Gastro-esophageal reflux disease without esophagitis: Secondary | ICD-10-CM

## 2016-02-20 DIAGNOSIS — R0789 Other chest pain: Secondary | ICD-10-CM

## 2016-02-20 NOTE — Progress Notes (Signed)
Chief Complaint  Patient presents with  . Follow-up    echo results     Subjective: Patient is a 60 y.o. female here for chest discomfort.  Has a central chest pressure that is an 8/10 in pain. This has been going on for around 6 weeks. No radiation, seems to be when she is lying down or eating. She has a hx of reflux and states it does not feel like that. Denies arm pain, SOB, jaw pain, numbness or tingling. She has never taken a reflux medicine for this. Propping up her pillow seems to help and drinking water as well.   She did note that she will sometimes have this central chest pressure when she physically exerts herself. She was raking the leaves the other day and started having it. She took a break and then started feeling better again. It will happen when she works out if she does not slowly warm up. It also happens when she gets emotionally worked up.   ROS: Heart: Denies chest pain or palpitations Lungs: Denies SOB or cough  Family History  Problem Relation Age of Onset  . Pancreatitis Mother   . Diabetes Mother   . Pancreatitis Sister   . Pancreatitis Maternal Aunt   . Pancreatitis Maternal Uncle    Past Medical History:  Diagnosis Date  . Diabetes mellitus type 2 in obese ([place]) 02/14/2016  . Essential hypertension 02/04/2016  . History of chicken pox   . Obesity 02/04/2016  . Tobacco abuse 02/14/2016   Allergies  Allergen Reactions  . Latex   . Penicillins   . Red Dye   . Shellfish Allergy     Current Outpatient Prescriptions:  .  acetaminophen (TYLENOL) 500 MG tablet, Take 2 tablets (1,000 mg total) by mouth every 6 (six) hours as needed., Disp: 30 tablet, Rfl: 0 .  atorvastatin (LIPITOR) 40 MG tablet, Take 1 tablet (40 mg total) by mouth daily., Disp: 90 tablet, Rfl: 3 .  lisinopril (PRINIVIL,ZESTRIL) 20 MG tablet, Take 1 tablet (20 mg total) by mouth daily., Disp: 30 tablet, Rfl: 0 .  meclizine (ANTIVERT) 25 MG tablet, Take 25 mg by mouth 3 (three) times  daily as needed for dizziness., Disp: , Rfl:  .  Multiple Vitamins-Minerals (MULTI ADULT GUMMIES PO), Take by mouth. Take 1 gummies by mouth daily., Disp: , Rfl:  .  ondansetron (ZOFRAN-ODT) 4 MG disintegrating tablet, Take 1 tablet (4 mg total) by mouth every 8 (eight) hours as needed for nausea or vomiting., Disp: 20 tablet, Rfl: 0 .  traMADol (ULTRAM) 50 MG tablet, Take 2 tablets (100 mg total) by mouth every 6 (six) hours as needed. 1-2 tablets every 6 hours as needed, Disp: 20 tablet, Rfl: 0  Objective: BP 136/88 (BP Location: Left Arm, Patient Position: Sitting, Cuff Size: Small)   Pulse 72   Temp 98.1 F (36.7 C) (Oral)   Ht 5\' 6"  (1.676 m)   Wt 195 lb 6.4 oz (88.6 kg)   SpO2 98%   BMI 31.54 kg/m  General: Awake, appears stated age HEENT: MMM, no pharyngeal exudate, EOMi, PERRLA Neck: No masses or thyromegaly, symmetric Heart: RRR, no murmurs, no LE edema, no bruits Lungs: CTAB, no rales, wheezes or rhonchi. Normal effort wo accessory muscle use Abd: BS+, soft, NT, ND, no masses or organomegaly, Neg McBurney's and Murphy's Psych: Age appropriate judgment and insight, normal affect and mood  Assessment and Plan: Other chest pain - Plan: Ambulatory referral to Cardiology  Gastroesophageal reflux disease,  esophagitis presence not specified  Orders as above. Many of her symptoms sound like they are due to reflux. However, she is also having exertional chest pain lasting much shorter than a few hours. I will thus refer to cardiology so she can hopefully have a stress test done.  F/u pending the above workup. The patient voiced understanding and agreement to the plan.  [place], DO 02/20/16  10:45 AM

## 2016-02-20 NOTE — Patient Instructions (Addendum)
Try Prilosec (omeprazole) 20 mg twice daily for 1-2 mo for your reflux symptoms. You could also try Zantac (ranitidine) 150 mg twice daily also.

## 2016-02-20 NOTE — Progress Notes (Signed)
Pre visit review using our clinic review tool, if applicable. No additional management support is needed unless otherwise documented below in the visit note. 

## 2016-03-03 ENCOUNTER — Encounter: Payer: Self-pay | Admitting: Family Medicine

## 2016-03-03 ENCOUNTER — Ambulatory Visit (INDEPENDENT_AMBULATORY_CARE_PROVIDER_SITE_OTHER): Payer: BC Managed Care – PPO | Admitting: Family Medicine

## 2016-03-03 VITALS — BP 134/88 | HR 75 | Temp 98.0°F | Resp 18 | Ht 66.0 in | Wt 186.0 lb

## 2016-03-03 DIAGNOSIS — Z720 Tobacco use: Secondary | ICD-10-CM

## 2016-03-03 DIAGNOSIS — E1169 Type 2 diabetes mellitus with other specified complication: Secondary | ICD-10-CM

## 2016-03-03 DIAGNOSIS — Z23 Encounter for immunization: Secondary | ICD-10-CM | POA: Diagnosis not present

## 2016-03-03 DIAGNOSIS — Z2821 Immunization not carried out because of patient refusal: Secondary | ICD-10-CM | POA: Diagnosis not present

## 2016-03-03 DIAGNOSIS — I10 Essential (primary) hypertension: Secondary | ICD-10-CM

## 2016-03-03 DIAGNOSIS — E669 Obesity, unspecified: Secondary | ICD-10-CM

## 2016-03-03 MED ORDER — GLUCOSE BLOOD VI STRP: ORAL_STRIP | 3 refills | Status: DC

## 2016-03-03 MED ORDER — ONETOUCH DELICA LANCETS FINE MISC: 2 refills | Status: AC

## 2016-03-03 MED ORDER — METFORMIN HCL 500 MG PO TABS: ORAL_TABLET | ORAL | 1 refills | Status: DC

## 2016-03-03 MED FILL — metFORMIN HCL 500 MG TABS: 500 | 30 days supply | Qty: 120 | Fill #0

## 2016-03-03 NOTE — Progress Notes (Signed)
Pre visit review using our clinic review tool, if applicable. No additional management support is needed unless otherwise documented below in the visit note. 

## 2016-03-03 NOTE — Addendum Note (Signed)
Addended by: Kelle Darting A on: 03/03/2016 11:56 AM   Modules accepted: Orders

## 2016-03-03 NOTE — Patient Instructions (Signed)
Check your sugars twice weekly.  Check your BP after you take your medication. Wait around 20-30 min if able.  Stop smoking.  Call your heart doctor to set up an appointment.  Keep exercising.  Someone will contact you when your eye appointment is scheduled.  Stop smoking.

## 2016-03-03 NOTE — Progress Notes (Signed)
Subjective:   Chief Complaint  Patient presents with  . Hypertension    Pt here for follow up  . Diabetes    Pt here for follow up    Tiffany Estrada is a 60 y.o. female here for follow-up of diabetes.   She does not have a monitor. Patient does not require insulin.   Patient exercises 7 days per week on average.   She does take an aspirin daily. Statin? Yes ACEi/ARB? Yes  Hypertension Patient presents for hypertension follow up. She does monitor home blood pressures. Blood pressures ranging 130-140's over 80's, checks before she takes her meds She is compliant with medications. Patient has these side effects of medication: none She specifically denies headache, visual changes, chest pain, palpitations, dyspnea, orthopnea, PND or peripheral edema. She is mostly adhering to a low sodium and low fat diet. She exercises daily.  Still smokes 1-2 cigarettes daily, is trying to quit cold [place].  ROS: Eyes: No vision changes Lungs: No cough or SOB  Past Medical History:  Diagnosis Date  . Diabetes mellitus type 2 in obese ([place]) 02/14/2016  . Essential hypertension 02/04/2016  . History of chicken pox   . Obesity 02/04/2016  . Tobacco abuse 02/14/2016    Past Surgical History:  Procedure Laterality Date  . ABDOMINAL HYSTERECTOMY    . APPENDECTOMY    . CHOLECYSTECTOMY      Social History   Social History  . Marital status: Married   Social History Main Topics  . Smoking status: Current Every Day Smoker    Types: Cigarettes  . Smokeless tobacco: Never Used     Comment: 3 cigarettes oer day  . Alcohol use No  . Drug use: No   Current Outpatient Prescriptions on File Prior to Visit  Medication Sig Dispense Refill  . acetaminophen (TYLENOL) 500 MG tablet Take 2 tablets (1,000 mg total) by mouth every 6 (six) hours as needed. 30 tablet 0  . atorvastatin (LIPITOR) 40 MG tablet Take 1 tablet (40 mg total) by mouth daily. 90 tablet 3  . lisinopril (PRINIVIL,ZESTRIL) 20  MG tablet Take 1 tablet (20 mg total) by mouth daily. 30 tablet 0  . Multiple Vitamins-Minerals (MULTI ADULT GUMMIES PO) Take by mouth. Take 1 gummies by mouth daily.      Related testing: Date of retinal exam: 2015- does not remember, was not a DM exam Stress Test: not done Pneumovax: Will receive today Flu: Refuses  Objective:  BP 134/88 (BP Location: Right Arm, Patient Position: Sitting, Cuff Size: Normal)   Pulse 75   Temp 98 F (36.7 C) (Oral)   Resp 18   Ht 5\' 6"  (1.676 m)   Wt 186 lb (84.4 kg)   SpO2 97%   BMI 30.02 kg/m  General:  Well developed, well nourished, in no apparent distress Skin:  Warm, no pallor or diaphoresis Head:  Normocephalic, atraumatic Eyes:  Pupils equal and round, sclera anicteric without injection  Nose:  External nares without trauma, no discharge Throat/Pharynx:  Lips and gingiva without lesion Neck: Neck supple.  No obvious thyromegaly or masses.  No bruits Lungs:  clear to auscultation, breath sounds equal bilaterally, no wheezes, rales, or stridor Cardio:  regular rate and rhythm 2/6 SEM heard loudest at aortic listening post, no LE edema, no bruits Abdomen:  Abdomen soft, non-tender, BS normal Musculoskeletal:  Symmetrical muscle groups noted without atrophy or deformity, nml gait Extremities:  No clubbing, cyanosis, or edema, no deformities, no skin discoloration Neuro:  Alert and oriented to person, place, and time. Psych: Age appropriate judgment and insight. Nml affect and mood.  Assessment:   Diabetes mellitus type 2 in obese ([place]) - Plan: metFORMIN (GLUCOPHAGE) 500 MG tablet, Ambulatory referral to Ophthalmology, glucose blood (ONETOUCH VERIO) test strip, ONETOUCH DELICA LANCETS FINE MISC  Essential hypertension  Tobacco abuse  Refused influenza vaccine   Plan:   Orders as above. Start Metformin. Refer to ophtho.  Recheck a1c next visit. Encouraged her to contact cardiology to set up appointment. Stop smoking. F/u monthly  until controlled. The patient voiced understanding and agreement to the plan.  [place], DO 03/03/16 11:37 AM

## 2016-03-07 ENCOUNTER — Telehealth: Payer: Self-pay | Admitting: Family Medicine

## 2016-03-07 DIAGNOSIS — E669 Obesity, unspecified: Principal | ICD-10-CM

## 2016-03-07 DIAGNOSIS — E1169 Type 2 diabetes mellitus with other specified complication: Secondary | ICD-10-CM

## 2016-03-07 NOTE — Telephone Encounter (Signed)
Pt called in requesting a call back. She wouldn't go into detail but she did say that she had questions about an appt. Pt would like to be called back at: [phone]

## 2016-03-07 NOTE — Telephone Encounter (Signed)
Tiffany Estrada-- can you call [facility] and cancel pt appt on 03/25/16 and fax new referral to Dr Zenia Resides in [place]?  Pt called back states she was contacted by Glenetta Hew with [facility] for consultation on 03/14/16 and wanted to confirm this was the group we referred her to. Confirmed referral.  Pt states we referred her to Dr Phineas Real, [facility] but they can't see her until 03/25/16. She has contacted Dr Zenia Resides with [facility] in [place] and has appt with them on 03/13/16. She asks that we cancel previous referral and send new one to Dr Zenia Resides.

## 2016-03-07 NOTE — Telephone Encounter (Signed)
Left message for pt to return my call.

## 2016-03-07 NOTE — Telephone Encounter (Signed)
[facility] closes at 2 on Fridays, will call on Monday to cancel appointment

## 2016-03-11 ENCOUNTER — Encounter: Payer: Self-pay | Admitting: Cardiology

## 2016-03-13 LAB — HM DIABETES EYE EXAM

## 2016-03-14 ENCOUNTER — Encounter (INDEPENDENT_AMBULATORY_CARE_PROVIDER_SITE_OTHER): Payer: BC Managed Care – PPO

## 2016-03-14 ENCOUNTER — Ambulatory Visit: Payer: BC Managed Care – PPO | Admitting: Cardiology

## 2016-03-14 ENCOUNTER — Encounter: Payer: Self-pay | Admitting: Cardiology

## 2016-03-14 ENCOUNTER — Ambulatory Visit (INDEPENDENT_AMBULATORY_CARE_PROVIDER_SITE_OTHER): Payer: BC Managed Care – PPO | Admitting: Cardiology

## 2016-03-14 VITALS — BP 146/92 | HR 69 | Ht 66.0 in | Wt 185.4 lb

## 2016-03-14 DIAGNOSIS — Z720 Tobacco use: Secondary | ICD-10-CM

## 2016-03-14 DIAGNOSIS — R0609 Other forms of dyspnea: Secondary | ICD-10-CM | POA: Diagnosis not present

## 2016-03-14 DIAGNOSIS — R079 Chest pain, unspecified: Secondary | ICD-10-CM | POA: Diagnosis not present

## 2016-03-14 DIAGNOSIS — I10 Essential (primary) hypertension: Secondary | ICD-10-CM | POA: Diagnosis not present

## 2016-03-14 DIAGNOSIS — R002 Palpitations: Secondary | ICD-10-CM | POA: Insufficient documentation

## 2016-03-14 DIAGNOSIS — R0600 Dyspnea, unspecified: Secondary | ICD-10-CM | POA: Insufficient documentation

## 2016-03-14 NOTE — Progress Notes (Signed)
PCP: Shelda Pal, DO  Clinic Note: Chief Complaint  Patient presents with  . New Patient (Initial Visit)    Chest pain or palpitations    HPI: Tiffany Estrada is a 60 y.o. female with a PMH notable for essential hypertension and type 2 diabetes with obesity who presents today for initial cardiology consultation for complaints of central chest pressure and heart racing/palpitations.  Tiffany Estrada was seen on October 30 by her PCP, at that time she denied any chest pain or palpitations however she is referred for these complaints based on a visit from October 18 when she noted 6 weeks of ongoing chest pressure..  Recent Hospitalizations: An ER visit for what sounds like a cold  Studies Reviewed: None  Interval History: Tiffany Estrada presents today with a couple different symptoms. Most notable was her complaint of intermittent substernal chest pain that began in the end of August. She described it as a pressure sensation of tightness. She would notice the symptoms either when she felt stressed out emotionally, but also with exertion. One episode was noted while raking leaves.  It was relieved when she stopped and took a deep breath. It also was noted when she would try to do routine exercise. Most the time these episodes are relatively short-lived but can be as bad as 7-8/10.   In addition to the chest tightness and pressure, she also notes exertional dyspnea. If she overexerts herself, she will oftentimes feel dizzy with some nausea and even to the point of emesis when she exerts herself. This particular symptom tends to happen when she is emotionally stressed. There is some association with a similar sensation after she eats, but it is a slightly different discomfort.   Another complaint she notes is having episodes where she feels that her heart rate(goes up for a fast without any particular reason and she feels her heart pounding ears. With that symptom she notes chest pressure as  well.. 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 She then goes back home to take after working, it is at that time she lies down she feels her heart rate going fast. Usually if she sits up and catching her breath, the symptom will resolve on its own. Feeling her heart is beating irregularly, this is beating fast and hard.  He otherwise denies any PND or orthopnea. She has not had any symptoms of near syncope or syncope. No TIA/amaurosis fugax symptoms. No claudication.  She is still continuing to smoke, but has cut down to about 12 cigarettes a day now at the most never smoked more than 1/4 pack a day.   ROS: A comprehensive was performed. Review of Systems  Constitutional: Negative for chills, fever, malaise/fatigue and weight loss.  HENT: Negative for hearing loss.   Eyes: Positive for blurred vision (When she feels nauseated and has the dizziness with overexertion).  Gastrointestinal: Positive for heartburn, nausea and vomiting. Negative for blood in stool, constipation and melena.       See history of present illness  Genitourinary: Negative for hematuria.  Musculoskeletal: Negative for joint pain and myalgias.  Skin: Negative.   Neurological: Positive for dizziness (See history of present illness). Negative for loss of consciousness.  Psychiatric/Behavioral: Negative for depression and memory loss. The patient is not nervous/anxious and does not have insomnia.   All other systems reviewed and are negative.   Past Medical History:  Diagnosis Date  . Diabetes mellitus type 2  in obese ([place]) 02/14/2016  . Essential hypertension 02/04/2016  . History of chicken pox   . Obesity 02/04/2016  . Tobacco abuse 02/14/2016    Past Surgical History:  Procedure Laterality Date  . ABDOMINAL HYSTERECTOMY    . APPENDECTOMY    . CHOLECYSTECTOMY     Prior to Admission medications   Medication Sig Start Date Taking? Authorizing Provider  acetaminophen (TYLENOL) 500 MG  tablet Take 2 tablets (1,000 mg total) by mouth every 6 (six) hours as needed. 01/26/16 Yes Charlesetta Shanks, MD  atorvastatin (LIPITOR) 40 MG tablet Take 1 tablet (40 mg total) by mouth daily. 02/08/16 Yes Crosby Oyster [place], DO  glucose blood (ONETOUCH VERIO) test strip Use to check sugars twice weekly. 03/03/16 Yes Crosby Oyster [place], DO  lisinopril (PRINIVIL,ZESTRIL) 20 MG tablet Take 1 tablet (20 mg total) by mouth daily. 02/08/16 Yes Crosby Oyster [place], DO  metFORMIN (GLUCOPHAGE) 500 MG tablet Week 1: 1 tab daily Week 2: 1 tab twice daily Week 3: 2 tabs in AM, 1 in evening Week 4: 2 tabs twice daily 03/03/16 Yes Crosby Oyster Wendling, DO  Multiple Vitamins-Minerals (MULTI ADULT GUMMIES PO) Take by mouth. Take 1 gummies by mouth daily.  Yes Historical Provider, MD  ondansetron (ZOFRAN-ODT) 4 MG disintegrating tablet Take 4 mg by mouth every 6 (six) hours as needed for nausea/vomiting. 02/08/16 Yes Historical Provider, MD  Jonetta Speak LANCETS FINE MISC Use to check sugars twice weekly. 03/03/16 Yes Shelda Pal, DO   Allergies  Allergen Reactions  . Latex   . Penicillins   . Red Dye   . Shellfish Allergy     Social History   Social History  . Marital status: Married    Spouse name: N/A  . Number of children: N/A  . Years of education: N/A   Social History Main Topics  . Smoking status: Current Every Day Smoker    Types: Cigarettes  . Smokeless tobacco: Never Used     Comment: 3 cigarettes oer day  . Alcohol use No  . Drug use: No  . Sexual activity: Not Asked   Other Topics Concern  . None   Social History Narrative  . None   Family History  Problem Relation Age of Onset  . Pancreatitis Mother   . Diabetes Mother   . Pancreatitis Sister   . Pancreatitis Maternal Aunt   . Pancreatitis Maternal Uncle      Wt Readings from Last 3 Encounters:  03/14/16 84.1 kg (185 lb 6.4 oz)  03/03/16 84.4 kg (186 lb)  02/20/16 88.6 kg (195 lb 6.4 oz)     PHYSICAL EXAM BP (!) 146/92 (BP Location: Right Arm)   Pulse 69   Ht 5\' 6"  (1.676 m)   Wt 84.1 kg (185 lb 6.4 oz)   BMI 29.92 kg/m  General appearance: alert, cooperative, appears stated age, no distress and Borderline obese. Well-nourished and well-groomed. HEENT: [place]/AT, EOMI, MMM, anicteric sclera Neck: no adenopathy, no carotid bruit and no JVD Lungs: clear to auscultation bilaterally, normal percussion bilaterally and non-labored Heart: RRR, normal S1 & S2.  No M/R/G.  Non-displaced PMI Abdomen: soft, non-tender; bowel sounds normal; no masses,  no organomegaly; no HJR Extremities: extremities normal, atraumatic, no cyanosis, oredema Pulses: 2+ and symmetric; no bruits Skin: mobility and turgor normal, no edema and no evidence of bleeding or bruising Neurologic: Mental status: Alert, oriented, thought content appropriate. Normal mood and affect Cranial nerves: normal (II-XII grossly intact)    Adult ECG  Report  Rate: 69 ;  Rhythm: normal sinus rhythm and Borderline low voltage. Normal axis, intervals and durations.; mild nonspecific ST and T-wave changes  I lined through the statements saying cannot rule out inferior infarct- Q wave in III does not count  Narrative Interpretation: Otherwise normal EKG   Other studies Reviewed: Additional studies/ records that were reviewed today include:  Recent Labs:   Lab Results  Component Value Date   CHOL 183 02/04/2016   HDL 47.10 02/04/2016   LDLCALC 123 (H) 02/04/2016   TRIG 68.0 02/04/2016   CHOLHDL 4 02/04/2016    ASSESSMENT / PLAN: Problem List Items Addressed This Visit    Essential hypertension - Primary (Chronic)    Borderline control on lisinopril alone. Pending ischemic evaluation, would probably consider adding beta blocker.      Relevant Orders   EKG 12-Lead   ECHOCARDIOGRAM COMPLETE   ECHOCARDIOGRAM STRESS TEST   Cardiac event monitor   Tobacco abuse    We talked about the importance of smoking cessation,  especially in light of her symptoms. She has contemplated quitting, but doesn't seem to be quite ready to make the final leap.      DOE (dyspnea on exertion)    In addition to her exertional chest pressure she also has dyspnea. Other than her mild smoking history, there is no other explanation for this.  Plan:   2-D echocardiogram to evaluate systolic and diastolic function as well as valvular disease.  Stress echocardiogram to evaluate for ischemia      Relevant Orders   EKG 12-Lead   ECHOCARDIOGRAM COMPLETE   ECHOCARDIOGRAM STRESS TEST   Cardiac event monitor   Chest pain with moderate risk for cardiac etiology    60 year old woman with smoking history and hypertension and hyperlipidemia exertional chest pressure - at least moderate risk for cardiac etiology. Ischemic evaluation warranted.  Plan: Stress Echo      Relevant Orders   EKG 12-Lead   ECHOCARDIOGRAM COMPLETE   ECHOCARDIOGRAM STRESS TEST   Cardiac event monitor   Palpitations    She has heart racing spells every 3-4 weeks. I think we hopefully can get enough episodes to determine etiology if she were to monitor for 2 weeks.  Plan: Two-week event monitor.      Relevant Orders   EKG 12-Lead   ECHOCARDIOGRAM COMPLETE   ECHOCARDIOGRAM STRESS TEST   Cardiac event monitor      Current medicines are reviewed at length with the patient today. (+/- concerns) none The following changes have been made: none  Patient Instructions  Your physician has recommended that you wear an event monitor wear for 14 days. Event monitors are medical devices that record the heart's electrical activity. Doctors most often [place] these monitors to diagnose arrhythmias. Arrhythmias are problems with the speed or rhythm of the heartbeat. The monitor is a small, portable device. You can wear one while you do your normal daily activities. This is usually used to diagnose what is causing palpitations/syncope (passing out).  schedule at [place] has requested that you have an echocardiogram. Echocardiography is a painless test that uses sound waves to create images of your heart. It provides your doctor with information about the size and shape of your heart and how well your heart's chambers and valves are working. This procedure takes approximately one hour. There are no restrictions for this procedure. and Your physician has requested that you have a stress echocardiogram. For  further information please visit [email or web address]. Please follow instruction sheet as given.   Your physician recommends that you schedule a follow-up appointment in: 6 weeks with DR HARDING.- 79 MIN  If you need a refill on your cardiac medications before your next appointment, please call your pharmacy.     Studies Ordered:   Orders Placed This Encounter  Procedures  . Cardiac event monitor  . EKG 12-Lead  . ECHOCARDIOGRAM COMPLETE  . ECHOCARDIOGRAM STRESS TEST      Glenetta Hew, M.D., M.S. Interventional Cardiologist   Pager # [phone] Phone # [phone] [address] [place], [place] [address]

## 2016-03-14 NOTE — Patient Instructions (Signed)
Your physician has recommended that you wear an event monitor wear for 14 days. Event monitors are medical devices that record the heart's electrical activity. Doctors most often [place] these monitors to diagnose arrhythmias. Arrhythmias are problems with the speed or rhythm of the heartbeat. The monitor is a small, portable device. You can wear one while you do your normal daily activities. This is usually used to diagnose what is causing palpitations/syncope (passing out).  schedule at [facility] has requested that you have an echocardiogram. Echocardiography is a painless test that uses sound waves to create images of your heart. It provides your doctor with information about the size and shape of your heart and how well your heart's chambers and valves are working. This procedure takes approximately one hour. There are no restrictions for this procedure. and Your physician has requested that you have a stress echocardiogram. For further information please visit [email or web address]. Please follow instruction sheet as given.   Your physician recommends that you schedule a follow-up appointment in: 6 weeks with DR HARDING.- 52 MIN  If you need a refill on your cardiac medications before your next appointment, please call your pharmacy.

## 2016-03-16 ENCOUNTER — Encounter: Payer: Self-pay | Admitting: Cardiology

## 2016-03-16 NOTE — Assessment & Plan Note (Signed)
60 year old woman with smoking history and hypertension and hyperlipidemia exertional chest pressure - at least moderate risk for cardiac etiology. Ischemic evaluation warranted.  Plan: Stress Echo

## 2016-03-16 NOTE — Assessment & Plan Note (Addendum)
We talked about the importance of smoking cessation, especially in light of her symptoms. She has contemplated quitting, but doesn't seem to be quite ready to make the final leap.

## 2016-03-16 NOTE — Assessment & Plan Note (Signed)
Borderline control on lisinopril alone. Pending ischemic evaluation, would probably consider adding beta blocker.

## 2016-03-16 NOTE — Assessment & Plan Note (Signed)
In addition to her exertional chest pressure she also has dyspnea. Other than her mild smoking history, there is no other explanation for this.  Plan:   2-D echocardiogram to evaluate systolic and diastolic function as well as valvular disease.  Stress echocardiogram to evaluate for ischemia

## 2016-03-16 NOTE — Assessment & Plan Note (Signed)
She has heart racing spells every 3-4 weeks. I think we hopefully can get enough episodes to determine etiology if she were to monitor for 2 weeks.  Plan: Two-week event monitor.

## 2016-03-19 ENCOUNTER — Telehealth: Payer: Self-pay | Admitting: Family Medicine

## 2016-03-19 NOTE — Telephone Encounter (Signed)
Pt called in she says that she need to speak with nurse. She says that she have a question about her medication and also has a question about a note for work. She says that it was never received.

## 2016-03-20 ENCOUNTER — Telehealth: Payer: Self-pay | Admitting: Family Medicine

## 2016-03-20 ENCOUNTER — Telehealth: Payer: Self-pay | Admitting: Cardiology

## 2016-03-20 NOTE — Telephone Encounter (Signed)
See telephone encounter for (03/20/16).//AB/CMA

## 2016-03-20 NOTE — Telephone Encounter (Signed)
New message      Pt is wearing a monitor.  She states that the electrodes are causing her to have a rash. Please call

## 2016-03-20 NOTE — Telephone Encounter (Signed)
I have spoken to patient who has requested Work Note stating that we have been treating her since 02/04/2016 for nose injury.Pt states she was alerted today that she was supposed to be seeing a W/C doctor vs regular one. 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 Pt was advised that if Dr. Nani Ravens did not originally take her out of work I am unsure that he would write note. Please advise for further recommendation. TL/CMA

## 2016-03-20 NOTE — Telephone Encounter (Signed)
We were really treating her BP after the initial visit. I am told that she was not able to return to work if her BP was not controlled. I can write a letter for that. She should be able to work. Thanks.

## 2016-03-20 NOTE — Telephone Encounter (Signed)
Returned call & discussed. She will contact company for different supplies, and will use topical cream (hydrocortisone or benadryl) for resolution of rash. Aware to call if unimproved w interventions, or if she has further needs.

## 2016-03-20 NOTE — Telephone Encounter (Signed)
Caller name: Relationship to patient: Self Can be reached: [phone]  Pharmacy:  Reason for call: Patient request call back to discuss paperwork that she needs. Would not go into detail, states she will tell the nurse what she needs

## 2016-03-21 ENCOUNTER — Telehealth: Payer: Self-pay | Admitting: Cardiology

## 2016-03-21 ENCOUNTER — Encounter: Payer: Self-pay | Admitting: Family Medicine

## 2016-03-21 NOTE — Telephone Encounter (Signed)
Patient would like note to be written to return to work releasing her back to work with no restrictions and that she is being treated for Hypertension with medication.  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 Also another reason she is requesting a note. Please advise.TL/CMA

## 2016-03-21 NOTE — Telephone Encounter (Signed)
Letter written with her requested information included.

## 2016-03-21 NOTE — Telephone Encounter (Signed)
Spoke with pt, explained did not think it is a problem but encouraged patient to call the monitor company to make sure.

## 2016-03-21 NOTE — Telephone Encounter (Signed)
I have informed patient letter will be up front for pick up. TL/CMA

## 2016-03-21 NOTE — Telephone Encounter (Signed)
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

## 2016-03-28 DIAGNOSIS — R0609 Other forms of dyspnea: Secondary | ICD-10-CM | POA: Diagnosis not present

## 2016-03-28 DIAGNOSIS — R079 Chest pain, unspecified: Secondary | ICD-10-CM | POA: Diagnosis not present

## 2016-03-28 DIAGNOSIS — I10 Essential (primary) hypertension: Secondary | ICD-10-CM | POA: Diagnosis not present

## 2016-03-28 DIAGNOSIS — R002 Palpitations: Secondary | ICD-10-CM

## 2016-03-31 ENCOUNTER — Encounter: Payer: Self-pay | Admitting: Family Medicine

## 2016-04-03 ENCOUNTER — Ambulatory Visit: Payer: BC Managed Care – PPO | Admitting: Family Medicine

## 2016-04-09 ENCOUNTER — Ambulatory Visit (INDEPENDENT_AMBULATORY_CARE_PROVIDER_SITE_OTHER): Payer: BC Managed Care – PPO | Admitting: Family Medicine

## 2016-04-09 ENCOUNTER — Encounter: Payer: Self-pay | Admitting: Family Medicine

## 2016-04-09 VITALS — BP 146/88 | HR 73 | Temp 98.0°F | Ht 66.0 in | Wt 183.8 lb

## 2016-04-09 DIAGNOSIS — Z23 Encounter for immunization: Secondary | ICD-10-CM

## 2016-04-09 DIAGNOSIS — E669 Obesity, unspecified: Secondary | ICD-10-CM | POA: Diagnosis not present

## 2016-04-09 DIAGNOSIS — M26609 Unspecified temporomandibular joint disorder, unspecified side: Secondary | ICD-10-CM | POA: Diagnosis not present

## 2016-04-09 DIAGNOSIS — E1169 Type 2 diabetes mellitus with other specified complication: Secondary | ICD-10-CM

## 2016-04-09 DIAGNOSIS — I10 Essential (primary) hypertension: Secondary | ICD-10-CM | POA: Diagnosis not present

## 2016-04-09 LAB — HEMOGLOBIN A1C: HEMOGLOBIN A1C: 7.6 % — AB (ref 4.6–6.5)

## 2016-04-09 MED ORDER — LISINOPRIL 20 MG PO TABS: 20.0000 mg | ORAL_TABLET | Freq: Every day | ORAL | 1 refills | Status: DC

## 2016-04-09 MED ORDER — METFORMIN HCL 1000 MG PO TABS: 1000.0000 mg | ORAL_TABLET | Freq: Two times a day (BID) | ORAL | 1 refills | Status: DC

## 2016-04-09 NOTE — Progress Notes (Signed)
Subjective:   Chief Complaint  Patient presents with  . Follow-up    BP    Tiffany Estrada is a 60 y.o. female here for follow-up of diabetes.   Star's self monitored glucose range is low hundred's Patient denies hypoglycemic reactions. She checks her glucose levels 1 times per week. Patient does not require insulin.   Patient exercises 7 days per week on average.   Patient has had diabetic and nutritional education.   She does take an aspirin daily. Statin? Yes ACEi/ARB? Yes  Hypertension Patient presents for hypertension follow up. She does monitor home blood pressures. Blood pressures ranging from 120-130's over 80's on average. She is on lisinopril 20 mg daily, however has not had any over the past several weeks. Patient has these side effects of medication: none She is now adhering to a low sodium and low fat diet. She exercises daily.  Jaw pain The past couple days, she has had left-sided ear pain and neck pain. She denies any urinary drainage or history of recurrent ear infections. Denies eating chewy foods or gum. No injury. No fevers.   Past Medical History:  Diagnosis Date  . Diabetes mellitus type 2 in obese ([place]) 02/14/2016  . Essential hypertension 02/04/2016  . History of chicken pox   . Obesity 02/04/2016  . Tobacco abuse 02/14/2016    Past Surgical History:  Procedure Laterality Date  . ABDOMINAL HYSTERECTOMY    . APPENDECTOMY    . CHOLECYSTECTOMY      Social History   Social History  . Marital status: Married   Social History Main Topics  . Smoking status: Current Every Day Smoker    Types: Cigarettes  . Smokeless tobacco: Never Used     Comment: 3 cigarettes oer day  . Alcohol use No  . Drug use: No   Current Outpatient Prescriptions on File Prior to Visit  Medication Sig Dispense Refill  . acetaminophen (TYLENOL) 500 MG tablet Take 2 tablets (1,000 mg total) by mouth every 6 (six) hours as needed. 30 tablet 0  . atorvastatin (LIPITOR)  40 MG tablet Take 1 tablet (40 mg total) by mouth daily. 90 tablet 3  . glucose blood (ONETOUCH VERIO) test strip Use to check sugars twice weekly. 200 each 3  . lisinopril (PRINIVIL,ZESTRIL) 20 MG tablet Take 1 tablet (20 mg total) by mouth daily. 30 tablet 0  . metFORMIN (GLUCOPHAGE) 500 MG tablet Week 1: 1 tab daily Week 2: 1 tab twice daily Week 3: 2 tabs in AM, 1 in evening Week 4: 2 tabs twice daily 120 tablet 1  . Multiple Vitamins-Minerals (MULTI ADULT GUMMIES PO) Take by mouth. Take 1 gummies by mouth daily.    . ondansetron (ZOFRAN-ODT) 4 MG disintegrating tablet Take 4 mg by mouth every 6 (six) hours as needed for nausea/vomiting.    [name] DELICA LANCETS FINE MISC Use to check sugars twice weekly. 200 each 2   Related testing: Foot exam(monofilament and inspection):done Retinal exam:done Date of retinal exam: 03/13/16  Done by:  Dr. Bing Plume Stress Test: done- nonischemic Pneumovax: done 02/2016 Flu Shot: done  Review of Systems: Eye:  No recent significant change in vision Pulmonary:  No SOB Cardiovascular:  No current CP  Objective:  BP (!) 146/88 (BP Location: Left Arm, Patient Position: Sitting, Cuff Size: Small)   Pulse 73   Temp 98 F (36.7 C) (Oral)   Ht 5\' 6"  (1.676 m)   Wt 183 lb 12.8 oz (83.4 kg)  SpO2 97%   BMI 29.67 kg/m  General:  Well developed, well nourished, in no apparent distress Skin:  Warm, no pallor or diaphoresis Head:  Normocephalic, atraumatic Eyes:  Pupils equal and round, sclera anicteric without injection  Nose:  External nares without trauma, no discharge Throat/Pharynx:  Lips and gingiva without lesion Neck: Neck supple.  No obvious thyromegaly or masses.  No bruits Lungs:  clear to auscultation, breath sounds equal bilaterally, no wheezes, rales, or stridor Cardio:  regular rate and rhythm without murmurs Abdomen:  Abdomen soft, non-tender, BS normal Musculoskeletal:  Symmetrical muscle groups noted without atrophy or  deformity Extremities:  No clubbing, cyanosis, or edema, no deformities, no skin discoloration Neuro:  Alert and oriented to person, place, and time.  Assessment:   Diabetes mellitus type 2 in obese ([place]) - Plan: Hemoglobin A1c, metFORMIN (GLUCOPHAGE) 1000 MG tablet  Essential hypertension - Plan: lisinopril (PRINIVIL,ZESTRIL) 20 MG tablet  TMJ (temporomandibular joint syndrome)  Encounter for immunization - Plan: Flu Vaccine QUAD 36+ mos IM   Plan:   Orders as above. We'll continue metformin, she is likely controlled given her fasting sugars. Check blood pressure 2-3 times per week until her nurse visit. Information given regarding TMJ. F/u in 3 mo, sooner if A1c is uncontrolled. 2 weeks for Nurse visit for blood pressure. The patient voiced understanding and agreement to the plan.  [place], DO 04/09/16 12:00 PM

## 2016-04-09 NOTE — Progress Notes (Signed)
Pre visit review using our clinic review tool, if applicable. No additional management support is needed unless otherwise documented below in the visit note. 

## 2016-04-09 NOTE — Patient Instructions (Addendum)
Stop smoking.  Keep exercising. Keep it up with dietary changes.  Check BP 2-3 times per week over the next 2 weeks. Bring your log and BP cuff to your nurse visit when your BP is checked.  Temporomandibular Joint Syndrome Temporomandibular joint (TMJ) syndrome is a condition that affects the joints between your jaw and your skull. The TMJs are located near your ears and allow your jaw to open and close. These joints and the nearby muscles are involved in all movements of the jaw. People with TMJ syndrome have pain in the area of these joints and muscles. Chewing, biting, or other movements of the jaw can be difficult or painful. TMJ syndrome can be caused by various things. In many cases, the condition is mild and goes away within a few weeks. For some people, the condition can become a long-term problem. What are the causes? Possible causes of TMJ syndrome include:  Grinding your teeth or clenching your jaw. Some people do this when they are under stress.  Arthritis.  Injury to the jaw.  Head or neck injury.  Teeth or dentures that are not aligned well. In some cases, the cause of TMJ syndrome may not be known. What are the signs or symptoms? The most common symptom is an aching pain on the side of the head in the area of the TMJ. Other symptoms may include:  Pain when moving your jaw, such as when chewing or biting.  Being unable to open your jaw all the way.  Making a clicking sound when you open your mouth.  Headache.  Earache.  Neck or shoulder pain. How is this diagnosed? Diagnosis can usually be made based on your symptoms, your medical history, and a physical exam. Your health care provider may check the range of motion of your jaw. Imaging tests, such as X-rays or an MRI, are sometimes done. You may need to see your dentist to determine if your teeth and jaw are lined up correctly. How is this treated? TMJ syndrome often goes away on its own. If treatment is needed,  the options may include:  Eating soft foods and applying ice or heat.  Medicines to relieve pain or inflammation.  Medicines to relax the muscles.  A splint, bite plate, or mouthpiece to prevent teeth grinding or jaw clenching.  Relaxation techniques or counseling to help reduce stress.  Transcutaneous electrical nerve stimulation (TENS). This helps to relieve pain by applying an electrical current through the skin.  Acupuncture. This is sometimes helpful to relieve pain.  Jaw surgery. This is rarely needed. Follow these instructions at home:  Take medicines only as directed by your health care provider.  Eat a soft diet if you are having trouble chewing.  Apply ice to the painful area.  Put ice in a plastic bag.  Place a towel between your skin and the bag.  Leave the ice on for 20 minutes, 2-3 times a day.  Apply a warm compress to the painful area as directed.  Massage your jaw area and perform any jaw stretching exercises as recommended by your health care provider.  If you were given a mouthpiece or bite plate, wear it as directed.  Avoid foods that require a lot of chewing. Do not chew gum.  Keep all follow-up visits as directed by your health care provider. This is important. Contact a health care provider if:  You are having trouble eating.  You have new or worsening symptoms. Get help right away if:  Your jaw locks open or closed. This information is not intended to replace advice given to you by your health care provider. Make sure you discuss any questions you have with your health care provider. Document Released: 01/14/2001 Document Revised: 12/20/2015 Document Reviewed: 11/24/2013 Elsevier Interactive Patient Education  2017 [employer].

## 2016-04-14 ENCOUNTER — Telehealth: Payer: Self-pay | Admitting: *Deleted

## 2016-04-14 NOTE — Telephone Encounter (Signed)
-----   Message from Leonie Man, MD sent at 04/10/2016  5:57 PM EST ----- Essentially normal monitor. No abnormalities found. No arrhythmias or significant PVCs versus PVCs  Glenetta Hew, MD

## 2016-04-14 NOTE — Telephone Encounter (Signed)
LEFT MESSAGE TO CALL BACK- IN REGARDS TO MONITOR RESULTS F/U OFFICE VISIT 12/181/17

## 2016-04-21 ENCOUNTER — Ambulatory Visit: Payer: BC Managed Care – PPO | Admitting: Cardiology

## 2016-04-23 ENCOUNTER — Other Ambulatory Visit (HOSPITAL_COMMUNITY): Payer: BC Managed Care – PPO

## 2016-04-23 ENCOUNTER — Ambulatory Visit (INDEPENDENT_AMBULATORY_CARE_PROVIDER_SITE_OTHER): Payer: BC Managed Care – PPO | Admitting: Family Medicine

## 2016-04-23 VITALS — BP 143/93 | HR 68

## 2016-04-23 DIAGNOSIS — I10 Essential (primary) hypertension: Secondary | ICD-10-CM | POA: Diagnosis not present

## 2016-04-23 MED FILL — ONETOUCH VERIO TEST STRIP: 87 days supply | Qty: 25 | Fill #0

## 2016-04-23 MED FILL — LISINOPRIL 20 MG TABLET: 20 | 90 days supply | Qty: 90 | Fill #0

## 2016-04-23 MED FILL — ONE TOUCH DELICA 30G LANCET: 50 days supply | Qty: 100 | Fill #0

## 2016-04-23 NOTE — Progress Notes (Signed)
Pre visit review using our clinic tool,if applicable. No additional management support is needed unless otherwise documented below in the visit note.   Patient in for BP check per order from Dr. Nani Ravens. Patient states she didn't realize she was supposed to take Lisinopril daily. Has not picked up from pharmacy yet. Will pick up today.   BP today =143/93 P=68 Per Dr. Nani Ravens patient to start Lisinopril and return to office for follow up appointment with provider. Appointment scheduled for 05/07/16 at 10:00 am

## 2016-04-23 NOTE — Progress Notes (Signed)
Noted. Agree with above. Needs to take her medicine.

## 2016-04-30 ENCOUNTER — Ambulatory Visit: Payer: BC Managed Care – PPO | Admitting: Physician Assistant

## 2016-05-07 ENCOUNTER — Ambulatory Visit (INDEPENDENT_AMBULATORY_CARE_PROVIDER_SITE_OTHER): Payer: BC Managed Care – PPO | Admitting: Family Medicine

## 2016-05-07 ENCOUNTER — Encounter: Payer: Self-pay | Admitting: Family Medicine

## 2016-05-07 VITALS — BP 136/70 | HR 78 | Temp 98.0°F | Ht 66.0 in | Wt 187.6 lb

## 2016-05-07 DIAGNOSIS — I10 Essential (primary) hypertension: Secondary | ICD-10-CM

## 2016-05-07 NOTE — Progress Notes (Signed)
Chief Complaint  Patient presents with  . Follow-up    BP     Subjective Tiffany Estrada is a 61 y.o. female who presents for hypertension follow up. She does monitor home blood pressures. Blood pressures ranging from 130-140's/80-90's on average. She is compliant with medications- Lisinopril 20 mg daily. Patient has these side effects of medication: none She is sometimes adhering to a healthy diet overall. Current exercise: Core work, walking   Past Medical History:  Diagnosis Date  . Diabetes mellitus type 2 in obese ([place]) 02/14/2016  . Essential hypertension 02/04/2016  . History of chicken pox   . Obesity 02/04/2016  . Tobacco abuse 02/14/2016   Family History  Problem Relation Age of Onset  . Pancreatitis Mother   . Diabetes Mother   . Pancreatitis Sister   . Pancreatitis Maternal Aunt   . Pancreatitis Maternal Uncle      Medications Current Outpatient Prescriptions on File Prior to Visit  Medication Sig Dispense Refill  . acetaminophen (TYLENOL) 500 MG tablet Take 2 tablets (1,000 mg total) by mouth every 6 (six) hours as needed. 30 tablet 0  . atorvastatin (LIPITOR) 40 MG tablet Take 1 tablet (40 mg total) by mouth daily. 90 tablet 3  . glucose blood (ONETOUCH VERIO) test strip Use to check sugars twice weekly. 200 each 3  . lisinopril (PRINIVIL,ZESTRIL) 20 MG tablet Take 1 tablet (20 mg total) by mouth daily. 90 tablet 1  . metFORMIN (GLUCOPHAGE) 1000 MG tablet Take 1 tablet (1,000 mg total) by mouth 2 (two) times daily with a meal. 180 tablet 1  . Multiple Vitamins-Minerals (MULTI ADULT GUMMIES PO) Take by mouth. Take 1 gummies by mouth daily.    . ondansetron (ZOFRAN-ODT) 4 MG disintegrating tablet Take 4 mg by mouth every 6 (six) hours as needed for nausea/vomiting.    [name] DELICA LANCETS FINE MISC Use to check sugars twice weekly. 200 each 2   Allergies Allergies  Allergen Reactions  . Latex   . Penicillins   . Red Dye   . Shellfish Allergy     Review of Systems Cardiovascular: no chest pain Respiratory:  no shortness of breath  Exam BP 136/70 (BP Location: Left Arm, Patient Position: Sitting, Cuff Size: Small)   Pulse 78   Temp 98 F (36.7 C) (Oral)   Ht 5\' 6"  (1.676 m)   Wt 187 lb 9.6 oz (85.1 kg)   SpO2 97%   BMI 30.28 kg/m  General:  well developed, well nourished, in no apparent distress Skin:  warm, no pallor or diaphoresis Eyes:  pupils equal and round, sclera anicteric without injection Heart :RRR, 2/6 SEM heard loudest at aortic listening post, no bruits, no LE edema Lungs:  clear to auscultation, no accessory muscle use Psych: well oriented with normal range of affect and appropriate judgment/insight  Essential hypertension   Status: Uncontrolled  Based on home readings, we are still not quite at goal, though close. Cont lisinopril at current dose.  Counsled on diet and exercise. Clean up the sweets.  F/u in 1 mo for nurse visit. Also will weigh her.  The patient voiced understanding and agreement to the plan.  [place], DO 05/07/16  10:23 AM

## 2016-05-07 NOTE — Patient Instructions (Signed)
Bring your blood pressure cuff to your nurse visit.  Clean up the diet by not consuming so many sweets.

## 2016-05-07 NOTE — Progress Notes (Signed)
Pre visit review using our clinic review tool, if applicable. No additional management support is needed unless otherwise documented below in the visit note. 

## 2016-05-22 ENCOUNTER — Ambulatory Visit: Payer: BC Managed Care – PPO | Admitting: Cardiology

## 2016-06-09 ENCOUNTER — Encounter: Payer: Self-pay | Admitting: Cardiology

## 2016-06-09 ENCOUNTER — Encounter (HOSPITAL_COMMUNITY): Payer: Self-pay | Admitting: Cardiology

## 2016-06-10 ENCOUNTER — Encounter: Payer: Self-pay | Admitting: Cardiology

## 2016-07-09 ENCOUNTER — Other Ambulatory Visit: Payer: Self-pay | Admitting: Family Medicine

## 2016-07-09 ENCOUNTER — Encounter: Payer: Self-pay | Admitting: Family Medicine

## 2016-07-09 ENCOUNTER — Ambulatory Visit (INDEPENDENT_AMBULATORY_CARE_PROVIDER_SITE_OTHER): Payer: BC Managed Care – PPO | Admitting: Family Medicine

## 2016-07-09 VITALS — BP 136/82 | HR 64 | Temp 97.8°F | Resp 16 | Ht 66.0 in | Wt 186.6 lb

## 2016-07-09 DIAGNOSIS — E1169 Type 2 diabetes mellitus with other specified complication: Secondary | ICD-10-CM | POA: Diagnosis not present

## 2016-07-09 DIAGNOSIS — E669 Obesity, unspecified: Secondary | ICD-10-CM

## 2016-07-09 DIAGNOSIS — I10 Essential (primary) hypertension: Secondary | ICD-10-CM

## 2016-07-09 LAB — HEMOGLOBIN A1C: HEMOGLOBIN A1C: 7.5 % — AB (ref 4.6–6.5)

## 2016-07-09 MED ORDER — AMLODIPINE BESYLATE-VALSARTAN 5-320 MG PO TABS: 1.0000 | ORAL_TABLET | Freq: Every day | ORAL | 2 refills | Status: DC

## 2016-07-09 MED ORDER — DAPAGLIFLOZIN PROPANEDIOL 5 MG PO TABS: 5.0000 mg | ORAL_TABLET | Freq: Every day | ORAL | 2 refills | Status: DC

## 2016-07-09 MED FILL — FARXIGA 5 MG TABLET: 5 | 30 days supply | Qty: 30 | Fill #0

## 2016-07-09 MED FILL — AMLODIPINE-VALSARTAN 5-320: 5-320 | 30 days supply | Qty: 30 | Fill #0

## 2016-07-09 NOTE — Progress Notes (Signed)
Pre visit review using our clinic review tool, if applicable. No additional management support is needed unless otherwise documented below in the visit note. 

## 2016-07-09 NOTE — Progress Notes (Signed)
Subjective:   Chief Complaint  Patient presents with  . Diabetes  . Hypertension    Tiffany Estrada is a 61 y.o. female here for follow-up of diabetes.   She has not been checking her home sugars.  Patient does not require insulin.   Medications include: Metformin 1000 mg BID Patient exercises 5 days per week on average.   She does take an aspirin daily. Statin? Yes ACEi/ARB? Yes  Hypertension Patient presents for hypertension follow up. She does monitor home blood pressures. Blood pressures ranging on average from 130-140's/80-90's. She is compliant with medications- Lisinopril 20 mg daily. Patient has these side effects of medication: none She is sometimes adhering to a healthy diet overall. Exercise: walking, sit ups   Past Medical History:  Diagnosis Date  . Diabetes mellitus type 2 in obese ([place]) 02/14/2016  . Essential hypertension 02/04/2016  . History of chicken pox   . Obesity 02/04/2016  . Tobacco abuse 02/14/2016    Past Surgical History:  Procedure Laterality Date  . ABDOMINAL HYSTERECTOMY    . APPENDECTOMY    . CHOLECYSTECTOMY      Social History   Social History  . Marital status: Married   Social History Main Topics  . Smoking status: Current Every Day Smoker    Types: Cigarettes  . Smokeless tobacco: Never Used     Comment: 3 cigarettes oer day  . Alcohol use No  . Drug use: No   Current Outpatient Prescriptions on File Prior to Visit  Medication Sig Dispense Refill  . acetaminophen (TYLENOL) 500 MG tablet Take 2 tablets (1,000 mg total) by mouth every 6 (six) hours as needed. 30 tablet 0  . atorvastatin (LIPITOR) 40 MG tablet Take 1 tablet (40 mg total) by mouth daily. 90 tablet 3  . glucose blood (ONETOUCH VERIO) test strip Use to check sugars twice weekly. 200 each 3  . lisinopril (PRINIVIL,ZESTRIL) 20 MG tablet Take 1 tablet (20 mg total) by mouth daily. 90 tablet 1  . metFORMIN (GLUCOPHAGE) 1000 MG tablet Take 1 tablet (1,000 mg total) by  mouth 2 (two) times daily with a meal. 180 tablet 1  . Multiple Vitamins-Minerals (MULTI ADULT GUMMIES PO) Take by mouth. Take 1 gummies by mouth daily.    [name] DELICA LANCETS FINE MISC Use to check sugars twice weekly. 200 each 2    Related testing: Foot exam(monofilament and inspection):done Retinal exam:done Date of retinal exam: 03/2016  Done by:  Dr. Bing Plume Pneumovax: done Flu Shot: done  Review of Systems: Eye:  No recent significant change in vision Pulmonary:  No SOB Cardiovascular:  No chest pain, no palpitations Skin/Integumentary ROS:  No abnormal skin lesions reported Neurologic:  No numbness, tingling  Objective:  BP 136/82 (BP Location: Left Arm, Cuff Size: Normal)   Pulse 64   Temp 97.8 F (36.6 C) (Oral)   Resp 16   Ht 5\' 6"  (1.676 m)   Wt 186 lb 9.6 oz (84.6 kg)   SpO2 97%   BMI 30.12 kg/m  General:  Well developed, well nourished, in no apparent distress Skin:  Warm, no pallor or diaphoresis Head:  Normocephalic, atraumatic Eyes:  Pupils equal and round, sclera anicteric without injection  Nose:  External nares without trauma, no discharge Throat/Pharynx:  Lips and gingiva without lesion Neck: Neck supple.  No obvious thyromegaly or masses.  No bruits Lungs:  clear to auscultation, breath sounds equal bilaterally, no wheezes, rales, or stridor Cardio:  regular rate and rhythm  without murmurs, no bruits, no LE edema Abdomen:  Abdomen soft, non-tender, BS normal Musculoskeletal:  Symmetrical muscle groups noted without atrophy or deformity Neuro:  Sensation intact to pinprick on feet Psych: Age appropriate judgment and insight  Assessment:   Diabetes mellitus type 2 in obese ([place]) - Plan: Hemoglobin A1c, HM Diabetes Foot Exam  Essential hypertension - Plan: amLODipine-valsartan (EXFORGE) 5-320 MG tablet   Plan:   Orders as above. Recommended she start checking her sugars 2-3 times per week and write down the readings.  Counseled on diet and  exercise, handout given. HTN uncontrolled- will stop Lisinopril and start Exforge combination pill.  F/u in 1 mo to recheck HTN, 3 mo for DM. The patient voiced understanding and agreement to the plan.  [place], DO 07/09/16 11:48 AM

## 2016-07-09 NOTE — Patient Instructions (Signed)
I recommend you check your sugars 2-3 times per week and writing it down. Bring your log to your next appointment.  Healthy Eating Plan Many factors influence your heart health, including eating and exercise habits. Heart (coronary) risk increases with abnormal blood fat (lipid) levels. Heart-healthy meal planning includes limiting unhealthy fats, increasing healthy fats, and making other small dietary changes. This includes maintaining a healthy body weight to help keep lipid levels within a normal range.  WHAT IS MY PLAN?  Your health care provider recommends that you:  Drink a glass of water before meals to help with satiety.  Eat slowly.  An alternative to the water is to add Metamucil. This will help with satiety as well. It does contain calories, unlike water.  WHAT TYPES OF FAT SHOULD I CHOOSE?  Choose healthy fats more often. Choose monounsaturated and polyunsaturated fats, such as olive oil and canola oil, flaxseeds, walnuts, almonds, and seeds.  Eat more omega-3 fats. Good choices include salmon, mackerel, sardines, tuna, flaxseed oil, and ground flaxseeds. Aim to eat fish at least two times each week.  Avoid foods with partially hydrogenated oils in them. These contain trans fats. Examples of foods that contain trans fats are stick margarine, some tub margarines, cookies, crackers, and other baked goods. If you are going to avoid a fat, this is the one to avoid!  WHAT GENERAL GUIDELINES DO I NEED TO FOLLOW?  Check food labels carefully to identify foods with trans fats. Avoid these types of options when possible.  Fill one half of your plate with vegetables and green salads. Eat 4-5 servings of vegetables per day. A serving of vegetables equals 1 cup of raw leafy vegetables,  cup of raw or cooked cut-up vegetables, or  cup of vegetable juice.  Fill one fourth of your plate with whole grains. Look for the word "whole" as the first word in the ingredient list.  Fill one  fourth of your plate with lean protein foods.  Eat 4-5 servings of fruit per day. A serving of fruit equals one medium whole fruit,  cup of dried fruit,  cup of fresh, frozen, or canned fruit. Try to avoid fruits in cups/syrups as the sugar content can be high.  Eat more foods that contain soluble fiber. Examples of foods that contain this type of fiber are apples, broccoli, carrots, beans, peas, and barley. Aim to get 20-30 g of fiber per day.  Eat more home-cooked food and less restaurant, buffet, and fast food.  Limit or avoid alcohol.  Limit foods that are high in starch and sugar.  Avoid fried foods when able.  Cook foods by using methods other than frying. Baking, boiling, grilling, and broiling are all great options. Other fat-reducing suggestions include: ? Removing the skin from poultry. ? Removing all visible fats from meats. ? Skimming the fat off of stews, soups, and gravies before serving them. ? Steaming vegetables in water or broth.  Lose weight if you are overweight. Losing just 5-10% of your initial body weight can help your overall health and prevent diseases such as diabetes and heart disease.  Increase your consumption of nuts, legumes, and seeds to 4-5 servings per week. One serving of dried beans or legumes equals  cup after being cooked, one serving of nuts equals 1 ounces, and one serving of seeds equals  ounce or 1 tablespoon.  WHAT ARE GOOD FOODS CAN I EAT? Grains Grainy breads (try to find bread that is 3 g of fiber per  slice or greater), oatmeal, light popcorn. Whole-grain cereals. Rice and pasta, including brown rice and those that are made with whole wheat. Edamame pasta is a great alternative to grain pasta. It has a higher protein content. Try to avoid significant consumption of white bread, sugary cereals, or pastries/baked goods.  Vegetables All vegetables. Cooked white potatoes do not count as vegetables.  Fruits All fruits, but limit pineapple  and bananas as these fruits have a higher sugar content.  Meats and Other Protein Sources Lean, well-trimmed beef, veal, pork, and lamb. Chicken and [place] without skin. All fish and shellfish. Wild duck, rabbit, pheasant, and venison. Egg whites or low-cholesterol egg substitutes. Dried beans, peas, lentils, and tofu.Seeds and most nuts.  Dairy Low-fat or nonfat cheeses, including ricotta, string, and mozzarella. Skim or 1% milk that is liquid, powdered, or evaporated. Buttermilk that is made with low-fat milk. Nonfat or low-fat yogurt. Soy/Almond milk are good alternatives if you cannot handle dairy.  Beverages Water is the best for you. Sports drinks with less sugar are more desirable unless you are a highly active athlete.  Sweets and Desserts Sherbets and fruit ices. Honey, jam, marmalade, jelly, and syrups. Dark chocolate.  Eat all sweets and desserts in moderation.  Fats and Oils Nonhydrogenated (trans-free) margarines. Vegetable oils, including soybean, sesame, sunflower, olive, peanut, safflower, corn, canola, and cottonseed. Salad dressings or mayonnaise that are made with a vegetable oil. Limit added fats and oils that you use for cooking, baking, salads, and as spreads.  Other Cocoa powder. Coffee and tea. Most condiments.  The items listed above may not be a complete list of recommended foods or beverages. Contact your dietitian for more options.

## 2016-07-11 MED FILL — metFORMIN HCL 1000 MG TABS: 1000 | 90 days supply | Qty: 180 | Fill #0

## 2016-08-11 ENCOUNTER — Ambulatory Visit: Payer: BC Managed Care – PPO | Admitting: Family Medicine

## 2017-06-27 ENCOUNTER — Emergency Department (HOSPITAL_BASED_OUTPATIENT_CLINIC_OR_DEPARTMENT_OTHER)
Admission: EM | Admit: 2017-06-27 | Discharge: 2017-06-27 | Disposition: A | Discharge status: Home / Self Care | Payer: BC Managed Care – PPO | Attending: Emergency Medicine | Admitting: Emergency Medicine

## 2017-06-27 ENCOUNTER — Other Ambulatory Visit: Payer: Self-pay

## 2017-06-27 ENCOUNTER — Encounter (HOSPITAL_BASED_OUTPATIENT_CLINIC_OR_DEPARTMENT_OTHER): Payer: Self-pay | Admitting: Emergency Medicine

## 2017-06-27 DIAGNOSIS — Z79899 Other long term (current) drug therapy: Secondary | ICD-10-CM | POA: Diagnosis not present

## 2017-06-27 DIAGNOSIS — I10 Essential (primary) hypertension: Secondary | ICD-10-CM | POA: Diagnosis not present

## 2017-06-27 DIAGNOSIS — F17210 Nicotine dependence, cigarettes, uncomplicated: Secondary | ICD-10-CM | POA: Insufficient documentation

## 2017-06-27 DIAGNOSIS — J111 Influenza due to unidentified influenza virus with other respiratory manifestations: Secondary | ICD-10-CM | POA: Insufficient documentation

## 2017-06-27 DIAGNOSIS — Z7984 Long term (current) use of oral hypoglycemic drugs: Secondary | ICD-10-CM | POA: Diagnosis not present

## 2017-06-27 DIAGNOSIS — E119 Type 2 diabetes mellitus without complications: Secondary | ICD-10-CM | POA: Diagnosis not present

## 2017-06-27 DIAGNOSIS — Z91040 Latex allergy status: Secondary | ICD-10-CM | POA: Diagnosis not present

## 2017-06-27 DIAGNOSIS — R69 Illness, unspecified: Secondary | ICD-10-CM

## 2017-06-27 DIAGNOSIS — R05 Cough: Secondary | ICD-10-CM | POA: Diagnosis present

## 2017-06-27 MED ORDER — BENZONATATE 100 MG PO CAPS: 100.0000 mg | ORAL_CAPSULE | Freq: Three times a day (TID) | ORAL | 0 refills | Status: DC

## 2017-06-27 MED ORDER — FLUTICASONE PROPIONATE 50 MCG/ACT NA SUSP: 1.0000 | Freq: Every day | NASAL | 2 refills | Status: DC

## 2017-06-27 MED ORDER — NAPROXEN 500 MG PO TABS: 500.0000 mg | ORAL_TABLET | Freq: Two times a day (BID) | ORAL | 0 refills | Status: DC

## 2017-06-27 NOTE — ED Triage Notes (Signed)
Cough, sneezing, headache, and ear pain since Wednesday.

## 2017-06-27 NOTE — ED Provider Notes (Signed)
[facility] EMERGENCY DEPARTMENT Provider Note   CSN: [id number] Arrival date & time: 06/27/17  1305     History   Chief Complaint Chief Complaint  Patient presents with  . Flu like symptoms    HPI Tiffany Estrada is a 62 y.o. female with a past medical history of hypertension, who presents to ED for evaluation of 4-day history of influenza-like illness including cough, sneezing, rhinorrhea, body aches, subjective fever, left ear pain.  Sick contacts at home with similar symptoms.  She has been using over-the-counter cough lozenges, TheraFlu, hot teas with mild improvement in her symptoms.  She did not receive her influenza vaccine this year.  She denies any chest pain, neck pain, trouble breathing or trouble swallowing, hemoptysis, vomiting.  HPI  Past Medical History:  Diagnosis Date  . Diabetes mellitus type 2 in obese ([place]) 02/14/2016  . Essential hypertension 02/04/2016  . History of chicken pox   . Obesity 02/04/2016  . Tobacco abuse 02/14/2016    Patient Active Problem List   Diagnosis Date Noted  . DOE (dyspnea on exertion) 03/14/2016  . Chest pain with moderate risk for cardiac etiology 03/14/2016  . Palpitations 03/14/2016  . Diabetes mellitus type 2 in obese ([place]) 02/14/2016  . Tobacco abuse 02/14/2016  . Essential hypertension 02/04/2016  . Obesity 02/04/2016    Past Surgical History:  Procedure Laterality Date  . ABDOMINAL HYSTERECTOMY    . APPENDECTOMY    . CHOLECYSTECTOMY      OB History    No data available       Home Medications    Prior to Admission medications   Medication Sig Start Date End Date Taking? Authorizing Provider  acetaminophen (TYLENOL) 500 MG tablet Take 2 tablets (1,000 mg total) by mouth every 6 (six) hours as needed. 01/26/16   Charlesetta Shanks, MD  amLODipine-valsartan (EXFORGE) 5-320 MG tablet Take 1 tablet by mouth daily. 07/09/16   Shelda Pal, DO  atorvastatin (LIPITOR) 40 MG tablet Take 1 tablet (40  mg total) by mouth daily. 02/08/16   Shelda Pal, DO  benzonatate (TESSALON) 100 MG capsule Take 1 capsule (100 mg total) by mouth every 8 (eight) hours. 06/27/17   Rylynn Kobs, PA-C  dapagliflozin propanediol (FARXIGA) 5 MG TABS tablet Take 5 mg by mouth daily. 07/09/16   Shelda Pal, DO  fluticasone (FLONASE) 50 MCG/ACT nasal spray Place 1 spray into both nostrils daily. 06/27/17   Ziyad Dyar, PA-C  glucose blood (ONETOUCH VERIO) test strip Use to check sugars twice weekly. 03/03/16   Shelda Pal, DO  metFORMIN (GLUCOPHAGE) 1000 MG tablet Take 1 tablet (1,000 mg total) by mouth 2 (two) times daily with a meal. 04/09/16   Wendling, Crosby Oyster, DO  Multiple Vitamins-Minerals (MULTI ADULT GUMMIES PO) Take by mouth. Take 1 gummies by mouth daily.    [provider]  naproxen (NAPROSYN) 500 MG tablet Take 1 tablet (500 mg total) by mouth 2 (two) times daily. 06/27/17   Betzaida Cremeens, PA-C  ONETOUCH DELICA LANCETS FINE MISC Use to check sugars twice weekly. 03/03/16   Shelda Pal, DO    Family History Family History  Problem Relation Age of Onset  . Pancreatitis Mother   . Diabetes Mother   . Pancreatitis Sister   . Pancreatitis Maternal Aunt   . Pancreatitis Maternal Uncle     Social History Social History   Tobacco Use  . Smoking status: Current Every Day Smoker    Types:  Cigarettes  . Smokeless tobacco: Never Used  . Tobacco comment: 3 cigarettes oer day  Substance Use Topics  . Alcohol use: No    Alcohol/week: 0.0 oz  . Drug use: No     Allergies   Latex; Penicillins; Red dye; and Shellfish allergy   Review of Systems Review of Systems  Constitutional: Positive for chills. Negative for fever.  HENT: Positive for congestion, ear pain, rhinorrhea and sneezing. Negative for dental problem, drooling, postnasal drip and trouble swallowing.   Respiratory: Positive for cough.   Cardiovascular: Negative for chest pain.    Gastrointestinal: Negative for nausea and vomiting.  Musculoskeletal: Positive for myalgias.     Physical Exam Updated Vital Signs BP 118/81 (BP Location: Left Arm)   Pulse 88   Temp 98.4 F (36.9 C) (Oral)   Resp 20   Ht 5\' 6"  (1.676 m)   Wt 82.6 kg (182 lb)   SpO2 100%   BMI 29.38 kg/m   Physical Exam  Constitutional: She appears well-developed and well-nourished. No distress.  Nontoxic appearing and in no acute distress.  Speaking in complete sentences without difficulty.  HENT:  Head: Normocephalic and atraumatic.  Right Ear: A middle ear effusion is present.  Left Ear: A middle ear effusion is present.  Nose: Rhinorrhea present.  Mouth/Throat: Uvula is midline. No trismus in the jaw. No uvula swelling. No tonsillar exudate.  Eyes: Conjunctivae and EOM are normal. No scleral icterus.  Neck: Normal range of motion.  Cardiovascular: Normal rate, regular rhythm and normal heart sounds.  Pulmonary/Chest: Effort normal and breath sounds normal. No respiratory distress.  Neurological: She is alert.  Skin: No rash noted. She is not diaphoretic.  Psychiatric: She has a normal mood and affect.  Nursing note and vitals reviewed.    ED Treatments / Results  Labs (all labs ordered are listed, but only abnormal results are displayed) Labs Reviewed - No data to display  EKG  EKG Interpretation None       Radiology No results found.  Procedures Procedures (including critical care time)  Medications Ordered in ED Medications - No data to display   Initial Impression / Assessment and Plan / ED Course  I have reviewed the triage vital signs and the nursing notes.  Pertinent labs & imaging results that were available during my care of the patient were reviewed by me and considered in my medical decision making (see chart for details).     Patient presents to ED for evaluation of influenza-like illness that began 4 days ago.  She reports cough, rhinorrhea,  sneezing, body aches, subjective fever.  Sick contacts at home with similar symptoms.  She has been using over-the-counter medications with improvement in her symptoms.  On physical exam she is overall well-appearing.  Lungs are clear to auscultation bilaterally.  She is afebrile with no use of antipyretics.  Denies chest pain, hemoptysis, trouble breathing or trouble swallowing.  She is out of the window for Tamiflu administration will give symptomatic treatment with antitussives, anti-inflammatories and advised her to follow-up with PCP for further evaluation.  Patient appears stable for discharge at this time.  Strict return precautions given.  Portions of this note were generated with [employer]. Dictation errors may occur despite best attempts at proofreading.   Final Clinical Impressions(s) / ED Diagnoses   Final diagnoses:  Influenza-like illness    ED Discharge Orders        Ordered    benzonatate (TESSALON) 100 MG capsule  Every 8 hours     06/27/17 1437    naproxen (NAPROSYN) 500 MG tablet  2 times daily     06/27/17 1437    fluticasone (FLONASE) 50 MCG/ACT nasal spray  Daily     06/27/17 1437       Delia Heady, PA-C 06/27/17 1442    Charlesetta Shanks, MD 06/27/17 [phone]

## 2017-06-27 NOTE — Discharge Instructions (Signed)
Take Tessalon Perles as needed for cough.  Take naproxen as needed for body aches.  Use Flonase as needed for nasal congestion.

## 2019-02-24 ENCOUNTER — Other Ambulatory Visit: Payer: Self-pay

## 2019-02-25 ENCOUNTER — Encounter: Payer: Self-pay | Admitting: Family Medicine

## 2019-02-25 ENCOUNTER — Ambulatory Visit (INDEPENDENT_AMBULATORY_CARE_PROVIDER_SITE_OTHER): Payer: BC Managed Care – PPO | Admitting: Family Medicine

## 2019-02-25 VITALS — BP 160/100 | HR 78 | Temp 95.8°F | Ht 66.0 in | Wt 178.2 lb

## 2019-02-25 DIAGNOSIS — I10 Essential (primary) hypertension: Secondary | ICD-10-CM

## 2019-02-25 DIAGNOSIS — E669 Obesity, unspecified: Secondary | ICD-10-CM

## 2019-02-25 DIAGNOSIS — Z23 Encounter for immunization: Secondary | ICD-10-CM | POA: Diagnosis not present

## 2019-02-25 DIAGNOSIS — E1169 Type 2 diabetes mellitus with other specified complication: Secondary | ICD-10-CM | POA: Diagnosis not present

## 2019-02-25 DIAGNOSIS — Z1231 Encounter for screening mammogram for malignant neoplasm of breast: Secondary | ICD-10-CM | POA: Diagnosis not present

## 2019-02-25 DIAGNOSIS — Z114 Encounter for screening for human immunodeficiency virus [HIV]: Secondary | ICD-10-CM

## 2019-02-25 DIAGNOSIS — Z0000 Encounter for general adult medical examination without abnormal findings: Secondary | ICD-10-CM | POA: Diagnosis not present

## 2019-02-25 DIAGNOSIS — Z9114 Patient's other noncompliance with medication regimen: Secondary | ICD-10-CM | POA: Diagnosis not present

## 2019-02-25 DIAGNOSIS — Z1159 Encounter for screening for other viral diseases: Secondary | ICD-10-CM

## 2019-02-25 DIAGNOSIS — Z1211 Encounter for screening for malignant neoplasm of colon: Secondary | ICD-10-CM

## 2019-02-25 DIAGNOSIS — Z9189 Other specified personal risk factors, not elsewhere classified: Secondary | ICD-10-CM

## 2019-02-25 LAB — MICROALBUMIN / CREATININE URINE RATIO
Creatinine,U: 67.6 mg/dL
Microalb Creat Ratio: 2.3 mg/g (ref 0.0–30.0)
Microalb, Ur: 1.6 mg/dL (ref 0.0–1.9)

## 2019-02-25 LAB — LIPID PANEL
Cholesterol: 195 mg/dL (ref 0–200)
HDL: 50 mg/dL (ref >39.00)
LDL Cholesterol: 123 mg/dL — ABNORMAL HIGH (ref 0–99)
NonHDL: 144.65
Total CHOL/HDL Ratio: 4
Triglycerides: 106 mg/dL (ref 0.0–149.0)
VLDL: 21.2 mg/dL (ref 0.0–40.0)

## 2019-02-25 LAB — CBC
HCT: 45.4 % (ref 36.0–46.0)
Hemoglobin: 15.2 g/dL — ABNORMAL HIGH (ref 12.0–15.0)
MCHC: 33.4 g/dL (ref 30.0–36.0)
MCV: 87.8 fl (ref 78.0–100.0)
Platelets: 277 10*3/uL (ref 150.0–400.0)
RBC: 5.17 Mil/uL — ABNORMAL HIGH (ref 3.87–5.11)
RDW: 12.6 % (ref 11.5–15.5)
WBC: 8.2 10*3/uL (ref 4.0–10.5)

## 2019-02-25 LAB — COMPREHENSIVE METABOLIC PANEL
ALT: 27 U/L (ref 0–35)
AST: 21 U/L (ref 0–37)
Albumin: 4.4 g/dL (ref 3.5–5.2)
Alkaline Phosphatase: 105 U/L (ref 39–117)
BUN: 8 mg/dL (ref 6–23)
CO2: 27 mEq/L (ref 19–32)
Calcium: 9.8 mg/dL (ref 8.4–10.5)
Chloride: 100 mEq/L (ref 96–112)
Creatinine, Ser: 0.64 mg/dL (ref 0.40–1.20)
GFR: 114.21 mL/min (ref >60.00)
Glucose, Bld: 313 mg/dL — ABNORMAL HIGH (ref 70–99)
Potassium: 4 mEq/L (ref 3.5–5.1)
Sodium: 136 mEq/L (ref 135–145)
Total Bilirubin: 0.6 mg/dL (ref 0.2–1.2)
Total Protein: 7.4 g/dL (ref 6.0–8.3)

## 2019-02-25 LAB — HEMOGLOBIN A1C: Hgb A1c MFr Bld: 11.9 % — ABNORMAL HIGH (ref 4.6–6.5)

## 2019-02-25 MED ORDER — VALSARTAN 160 MG PO TABS: 160.0000 mg | ORAL_TABLET | Freq: Every day | ORAL | 3 refills | Status: DC

## 2019-02-25 MED ORDER — METFORMIN HCL 500 MG PO TABS: ORAL_TABLET | ORAL | 1 refills | Status: DC

## 2019-02-25 MED ORDER — ATORVASTATIN CALCIUM 40 MG PO TABS: 40.0000 mg | ORAL_TABLET | Freq: Every day | ORAL | 3 refills | Status: DC

## 2019-02-25 NOTE — Patient Instructions (Addendum)
Give [place] 2-3 business days to get the results of your labs back.   Keep the diet clean and stay active.  Keep checking your sugars and blood pressures at home.  If you do not hear anything about your referral in the next 1-2 weeks, call our office and ask for an update.  Call Center for [place] at [facility] at [phone] for an appointment.  They are located at [address], [place], [place], [address] (right across the hall from our office).  The new Shingrix vaccine (for shingles) is a 2 shot series. It can make people feel low energy, achy and almost like they have the flu for 48 hours after injection. Please plan accordingly when deciding on when to get this shot. Call our office for a nurse visit appointment to get this. The second shot of the series is less severe regarding the side effects, but it still lasts 48 hours.   Let us know if you need anything.

## 2019-02-25 NOTE — Progress Notes (Signed)
Chief Complaint  Patient presents with  . Annual Exam     Well Woman Tiffany Estrada is here for a complete physical.   Her last physical was >1 year ago.  Current diet: in general, a "healthy" diet. Current exercise: walking. Weight is stable and she denies daytime fatigue. No LMP recorded. Patient has had a hysterectomy. Seatbelt? Yes  Hypertension Patient presents for hypertension follow up. She does monitor home blood pressures. She is not compliant with medications and has not been on them in >1 yr.  She is now adhering to a healthy diet overall. Exercise: walking  DM Hx of DM, was on Metformin Last A1c >1 yr ago was 7.5 Checking sugars lately have been around 300. Diet/exercise as noted above. Was not on insulin. Due for eye exam Has had PCV23 and flu shot as of this year.    Health Maintenance Pap/HPV- No Mammogram- No Colon cancer screening-No Tetanus- No Hep C screening- No HIV screening- No  Past Medical History:  Diagnosis Date  . Diabetes mellitus type 2 in obese ([place]) 02/14/2016  . Essential hypertension 02/04/2016  . History of chicken pox   . Obesity 02/04/2016  . Tobacco abuse 02/14/2016     Past Surgical History:  Procedure Laterality Date  . ABDOMINAL HYSTERECTOMY    . APPENDECTOMY    . CHOLECYSTECTOMY      Medications  Current Outpatient Medications on File Prior to Visit  Medication Sig Dispense Refill  . acetaminophen (TYLENOL) 500 MG tablet Take 2 tablets (1,000 mg total) by mouth every 6 (six) hours as needed. 30 tablet 0  . glucose blood (ONETOUCH VERIO) test strip Use to check sugars twice weekly. 200 each 3  . Multiple Vitamins-Minerals (MULTI ADULT GUMMIES PO) Take by mouth. Take 1 gummies by mouth daily.    [name] DELICA LANCETS FINE MISC Use to check sugars twice weekly. 200 each 2   Allergies Allergies  Allergen Reactions  . Latex   . Penicillins   . Red Dye   . Shellfish Allergy     Review of  Systems: Constitutional:  no unexpected weight changes Eye:  no recent significant change in vision Ear/Nose/Mouth/Throat:  Ears:  no recent change in hearing Nose/Mouth/Throat:  no complaints of nasal congestion, no sore throat Cardiovascular: no chest pain Respiratory:  no shortness of breath Gastrointestinal:  no abdominal pain, no change in bowel habits GU:  Female: negative for dysuria or pelvic pain Musculoskeletal/Extremities:  no pain of the joints Integumentary (Skin/Breast):  no abnormal skin lesions reported Neurologic:  no headaches Endocrine:  denies fatigue Hematologic/Lymphatic:  No areas of easy bleeding  Exam BP (!) 160/100 (BP Location: Left Arm, Patient Position: Sitting, Cuff Size: Normal)   Pulse 78   Temp (!) 95.8 F (35.4 C) (Temporal)   Ht 5\' 6"  (1.676 m)   Wt 178 lb 4 oz (80.9 kg)   SpO2 97%   BMI 28.77 kg/m  General:  well developed, well nourished, in no apparent distress Skin:  no significant moles, warts, or growths Head:  no masses, lesions, or tenderness Eyes:  pupils equal and round, sclera anicteric without injection Ears:  canals without lesions, TMs shiny without retraction, no obvious effusion, no erythema Nose:  nares patent, septum midline, mucosa normal, and no drainage or sinus tenderness Throat/Pharynx:  lips and gingiva without lesion; tongue and uvula midline; non-inflamed pharynx; no exudates or postnasal drainage Neck: neck supple without adenopathy, thyromegaly, or masses Lungs:  clear to auscultation,  breath sounds equal bilaterally, no respiratory distress Cardio:  regular rate and rhythm, no LE edema Abdomen:  abdomen soft, nontender; bowel sounds normal; no masses or organomegaly Genital: Defer to GYN Musculoskeletal:  symmetrical muscle groups noted without atrophy or deformity Extremities:  no clubbing, cyanosis, or edema, no deformities, no skin discoloration Neuro:  gait normal; deep tendon reflexes normal and symmetric;  sensation intact to pinprick on feet b/l Psych: well oriented with normal range of affect and appropriate judgment/insight  Assessment and Plan  Well adult exam - Plan: CBC, Comprehensive metabolic panel  Diabetes mellitus type 2 in obese ([place]) - Plan: Hemoglobin A1c, Lipid panel, Microalbumin / creatinine urine ratio  Essential hypertension  Encounter for screening mammogram for malignant neoplasm of breast - Plan: MM DIGITAL SCREENING BILATERAL  Screen for colon cancer - Plan: Ambulatory referral to Gastroenterology  Screening for HIV (human immunodeficiency virus) - Plan: HIV Antibody (routine testing w rflx)  Encounter for hepatitis C screening test for low risk patient - Plan: Hepatitis C antibody  10 year risk of MI or stroke 7.5% or greater - Plan: atorvastatin (LIPITOR) 40 MG tablet   Well 63 y.o. female. Counseled on diet and exercise. Other orders as above. Restart metformin and statin. She is not likely controlled.  Restart ARB. Monitor BP at home.  Follow up in 1 mo. The patient voiced understanding and agreement to the plan.  [place], DO 02/25/19 8:48 AM

## 2019-02-28 ENCOUNTER — Other Ambulatory Visit: Payer: Self-pay | Admitting: Family Medicine

## 2019-02-28 LAB — HEPATITIS C ANTIBODY
Hepatitis C Ab: NONREACTIVE
SIGNAL TO CUT-OFF: 0.04 (ref <1.00)

## 2019-02-28 LAB — HIV ANTIBODY (ROUTINE TESTING W REFLEX): HIV 1&2 Ab, 4th Generation: NONREACTIVE

## 2019-02-28 MED ORDER — LOSARTAN POTASSIUM 100 MG PO TABS: 100.0000 mg | ORAL_TABLET | Freq: Every day | ORAL | 3 refills | Status: DC

## 2019-03-02 ENCOUNTER — Ambulatory Visit (HOSPITAL_BASED_OUTPATIENT_CLINIC_OR_DEPARTMENT_OTHER)
Admission: RE | Admit: 2019-03-02 | Discharge: 2019-03-02 | Disposition: A | Discharge status: Home / Self Care | Payer: BC Managed Care – PPO | Source: Ambulatory Visit | Attending: Family Medicine | Admitting: Family Medicine

## 2019-03-02 ENCOUNTER — Other Ambulatory Visit: Payer: Self-pay

## 2019-03-02 DIAGNOSIS — Z1231 Encounter for screening mammogram for malignant neoplasm of breast: Secondary | ICD-10-CM | POA: Insufficient documentation

## 2019-03-30 ENCOUNTER — Ambulatory Visit: Payer: BC Managed Care – PPO | Admitting: Family Medicine

## 2019-03-30 ENCOUNTER — Encounter: Payer: Self-pay | Admitting: Family Medicine

## 2019-03-30 ENCOUNTER — Other Ambulatory Visit: Payer: Self-pay

## 2019-03-30 VITALS — BP 158/90 | HR 71 | Temp 96.2°F | Ht 66.0 in | Wt 178.5 lb

## 2019-03-30 DIAGNOSIS — E1169 Type 2 diabetes mellitus with other specified complication: Secondary | ICD-10-CM

## 2019-03-30 DIAGNOSIS — I10 Essential (primary) hypertension: Secondary | ICD-10-CM | POA: Diagnosis not present

## 2019-03-30 DIAGNOSIS — E669 Obesity, unspecified: Secondary | ICD-10-CM | POA: Diagnosis not present

## 2019-03-30 MED ORDER — AMLODIPINE BESYLATE 5 MG PO TABS: 5.0000 mg | ORAL_TABLET | Freq: Every day | ORAL | 3 refills | Status: DC

## 2019-03-30 NOTE — Patient Instructions (Addendum)
Keep checking your blood pressure at sugars at home. Check sugars 2-3 times per week.   Keep the diet clean and stay active.   Let us know if you need anything.

## 2019-03-30 NOTE — Progress Notes (Signed)
Chief Complaint  Patient presents with  . Follow-up    Subjective Tiffany Estrada is a 63 y.o. female who presents for hypertension follow up. She does monitor home blood pressures. Blood pressures ranging from 140's/90's on average. She is compliant with medication- losartan 100 mg/d. Patient has these side effects of medication: none She is sometimes adhering to a healthy diet overall. Current exercise: little  DM Started back in metformin. Sugars back in 100's. No AE's, reports compliance. Diet and exercise as above. No hypoglycemia. Compliant w statin.    Past Medical History:  Diagnosis Date  . Diabetes mellitus type 2 in obese ([name]) 02/14/2016  . Essential hypertension 02/04/2016  . History of chicken pox   . Obesity 02/04/2016  . Tobacco abuse 02/14/2016    Review of Systems Cardiovascular: no chest pain Respiratory:  no shortness of breath  Exam BP (!) 158/90 (BP Location: Left Arm, Patient Position: Sitting, Cuff Size: Normal)   Pulse 71   Temp (!) 96.2 F (35.7 C) (Temporal)   Ht 5\' 6"  (1.676 m)   Wt 178 lb 8 oz (81 kg)   SpO2 98%   BMI 28.81 kg/m  General:  well developed, well nourished, in no apparent distress Heart: RRR, no bruits, no LE edema Lungs: clear to auscultation, no accessory muscle use Psych: well oriented with normal range of affect and appropriate judgment/insight  Essential hypertension - Plan: amLODipine (NORVASC) 5 MG tablet  Diabetes mellitus type 2 in obese ([facility]) - Plan: metFORMIN (GLUCOPHAGE) 500 MG tablet  1- Uncontrolled. Cont ARB. Add Norvasc. This combo had her controlled prior to her 2 years off of the medication. Counseled on diet and exercise. 2- Needs to ck sugars more freq. F/u in 1 mo. The patient voiced understanding and agreement to the plan.  [place], DO 03/30/19  9:20 AM

## 2019-05-02 ENCOUNTER — Ambulatory Visit: Payer: BC Managed Care – PPO | Admitting: Family Medicine

## 2019-05-03 ENCOUNTER — Encounter: Payer: Self-pay | Admitting: Family Medicine

## 2019-05-11 ENCOUNTER — Other Ambulatory Visit: Payer: Self-pay

## 2019-05-11 ENCOUNTER — Encounter: Payer: Self-pay | Admitting: Family Medicine

## 2019-05-11 ENCOUNTER — Ambulatory Visit (INDEPENDENT_AMBULATORY_CARE_PROVIDER_SITE_OTHER): Payer: BC Managed Care – PPO | Admitting: Family Medicine

## 2019-05-11 VITALS — BP 150/90 | Ht 66.0 in | Wt 175.0 lb

## 2019-05-11 DIAGNOSIS — I10 Essential (primary) hypertension: Secondary | ICD-10-CM

## 2019-05-11 DIAGNOSIS — E669 Obesity, unspecified: Secondary | ICD-10-CM | POA: Diagnosis not present

## 2019-05-11 DIAGNOSIS — E1169 Type 2 diabetes mellitus with other specified complication: Secondary | ICD-10-CM

## 2019-05-11 MED ORDER — LOSARTAN POTASSIUM 100 MG PO TABS: 100.0000 mg | ORAL_TABLET | Freq: Every day | ORAL | 6 refills | Status: DC

## 2019-05-11 MED ORDER — AMLODIPINE BESYLATE 10 MG PO TABS: 10.0000 mg | ORAL_TABLET | Freq: Every day | ORAL | 3 refills | Status: DC

## 2019-05-11 MED ORDER — DAPAGLIFLOZIN PROPANEDIOL 10 MG PO TABS: 10.0000 mg | ORAL_TABLET | Freq: Every day | ORAL | 2 refills | Status: DC

## 2019-05-11 NOTE — Progress Notes (Addendum)
Chief Complaint  Patient presents with  . Follow-up    blood pressure/ is out of losartan    Subjective Tiffany Estrada is a 64 y.o. female who presents for hypertension follow up. Due to COVID-19 pandemic, we are interacting via web portal for an electronic face-to-face visit. I verified patient's ID using 2 identifiers. Patient agreed to proceed with visit via this method. Patient is at home, I am at office. Patient and I are present for visit.   She does not routinely monitor home blood pressures. Blood pressures ranging from 140's/90's on average. She is compliant with medications- Norvasc 5 mg/d, losartan 100 mg/d. Patient has these side effects of medication: none She is adhering to a healthy diet overall. Current exercise: walking  Diabetes Has been monitoring sugars intermittently, did not check over last month. Diet/exercise as above. Did have it checked in Nov and it was in high 100's. Reports compliance w Metformin 1000 mg bid, no AE's.    Past Medical History:  Diagnosis Date  . Diabetes mellitus type 2 in obese ([place]) 02/14/2016  . Essential hypertension 02/04/2016  . History of chicken pox   . Obesity 02/04/2016  . Tobacco abuse 02/14/2016    Review of Systems Cardiovascular: no chest pain Respiratory:  no shortness of breath  Exam BP (!) 150/90 (BP Location: Right Arm, Patient Position: Sitting, Cuff Size: Normal)   Ht 5\' 6"  (1.676 m)   Wt 175 lb (79.4 kg)   BMI 28.25 kg/m  No conversational dyspnea Age appropriate judgment and insight Nml affect and mood  Diabetes mellitus type 2 in obese ([name]) - Plan: dapagliflozin propanediol (FARXIGA) 10 MG TABS tablet  Essential hypertension - Plan: losartan (COZAAR) 100 MG tablet, amLODipine (NORVASC) 10 MG tablet  1- start Farxiga, cont Metformin. Counseled on diet and exercise. Let us know if too expensive. 2- Cont losartan. Increase dose of Norvasc to 10 mg/d. Wilder Glade should help a little as well.  F/u in 4 weeks  to reck DM and ck A1c. . The patient voiced understanding and agreement to the plan.  [place], DO 05/11/19  9:30 AM

## 2019-07-08 ENCOUNTER — Other Ambulatory Visit: Payer: Self-pay

## 2019-07-08 ENCOUNTER — Ambulatory Visit (INDEPENDENT_AMBULATORY_CARE_PROVIDER_SITE_OTHER): Payer: BC Managed Care – PPO | Admitting: Family Medicine

## 2019-07-08 ENCOUNTER — Encounter: Payer: Self-pay | Admitting: Family Medicine

## 2019-07-08 VITALS — BP 138/86 | HR 66 | Temp 95.9°F | Ht 66.0 in | Wt 173.4 lb

## 2019-07-08 DIAGNOSIS — E1169 Type 2 diabetes mellitus with other specified complication: Secondary | ICD-10-CM

## 2019-07-08 DIAGNOSIS — I10 Essential (primary) hypertension: Secondary | ICD-10-CM | POA: Diagnosis not present

## 2019-07-08 DIAGNOSIS — L282 Other prurigo: Secondary | ICD-10-CM | POA: Diagnosis not present

## 2019-07-08 DIAGNOSIS — E669 Obesity, unspecified: Secondary | ICD-10-CM

## 2019-07-08 LAB — HEMOGLOBIN A1C: Hgb A1c MFr Bld: 11.7 % — ABNORMAL HIGH (ref 4.6–6.5)

## 2019-07-08 LAB — COMPREHENSIVE METABOLIC PANEL
ALT: 31 U/L (ref 0–35)
AST: 21 U/L (ref 0–37)
Albumin: 4.1 g/dL (ref 3.5–5.2)
Alkaline Phosphatase: 118 U/L — ABNORMAL HIGH (ref 39–117)
BUN: 11 mg/dL (ref 6–23)
CO2: 24 mEq/L (ref 19–32)
Calcium: 9.9 mg/dL (ref 8.4–10.5)
Chloride: 101 mEq/L (ref 96–112)
Creatinine, Ser: 0.61 mg/dL (ref 0.40–1.20)
GFR: 120.57 mL/min (ref >60.00)
Glucose, Bld: 312 mg/dL — ABNORMAL HIGH (ref 70–99)
Potassium: 3.7 mEq/L (ref 3.5–5.1)
Sodium: 134 mEq/L — ABNORMAL LOW (ref 135–145)
Total Bilirubin: 0.7 mg/dL (ref 0.2–1.2)
Total Protein: 7.6 g/dL (ref 6.0–8.3)

## 2019-07-08 LAB — LIPID PANEL
Cholesterol: 157 mg/dL (ref 0–200)
HDL: 53.6 mg/dL (ref >39.00)
LDL Cholesterol: 89 mg/dL (ref 0–99)
NonHDL: 103.7
Total CHOL/HDL Ratio: 3
Triglycerides: 76 mg/dL (ref 0.0–149.0)
VLDL: 15.2 mg/dL (ref 0.0–40.0)

## 2019-07-08 MED ORDER — SITAGLIPTIN PHOSPHATE 100 MG PO TABS: 100.0000 mg | ORAL_TABLET | Freq: Every day | ORAL | 2 refills | Status: DC

## 2019-07-08 MED ORDER — TRIAMCINOLONE ACETONIDE 0.1 % EX CREA: 1.0000 "application " | TOPICAL_CREAM | Freq: Two times a day (BID) | CUTANEOUS | 0 refills | Status: DC

## 2019-07-08 NOTE — Patient Instructions (Addendum)
Keep the diet clean and stay active.  Give [place] 2-3 business days to get the results of your labs back.   Keep checking your sugars.  Because your blood pressure is well-controlled, you no longer have to check your blood pressure at home anymore unless you wish. Some people check it twice daily every day and some people stop altogether. Either or anything in between is fine. Strong work!  Let us know if you need anything.

## 2019-07-08 NOTE — Progress Notes (Signed)
Chief Complaint  Patient presents with  . Follow-up  . Rash    Subjective Tiffany Estrada is a 64 y.o. female who presents for hypertension follow up. She does monitor home blood pressures. Blood pressures ranging from 130's/80's on average. She is compliant with medications- amlodipine 10 mg/d, losartan 100 mg/d. Patient has these side effects of medication: none She is adhering to a healthy diet overall. Current exercise: some walking  DM Sugars running in 200's. Compliant w metformin, did not pick up [place]. Diet/exercise as above. Due for eye exam.    Past Medical History:  Diagnosis Date  . Diabetes mellitus type 2 in obese ([place] [address] [place]) 02/14/2016  . Essential hypertension 02/04/2016  . History of chicken pox   . Obesity 02/04/2016  . Tobacco abuse 02/14/2016    Review of Systems Cardiovascular: no chest pain Respiratory:  no shortness of breath  Exam BP 138/86 (BP Location: Left Arm, Patient Position: Sitting, Cuff Size: Normal)   Pulse 66   Temp (!) 95.9 F (35.5 C) (Temporal)   Ht 5\' 6"  (1.676 m)   Wt 173 lb 6 oz (78.6 kg)   SpO2 96%   BMI 27.98 kg/m  General:  well developed, well nourished, in no apparent distress Heart: RRR, no bruits, no LE edema Lungs: clear to auscultation, no accessory muscle use Psych: well oriented with normal range of affect and appropriate judgment/insight  Diabetes mellitus type 2 in obese ([facility]) - Plan: sitaGLIPtin (JANUVIA) 100 MG tablet, Comprehensive metabolic panel, Lipid panel, Hemoglobin A1c  Pruritic rash - Plan: triamcinolone cream (KENALOG) 0.1 %  Essential hypertension  1- Add Januvia. Counseled on diet and exercise. If too expensive, will start Actos. Would want to avoid SGLT-2 until her sugars decrease.  2- Could be med side effect? Trial steroid cream and will consider med change if no improvement.  Spoke w pharmacy team who did not believe any specific med she is on caused her issue, so we can do trial/error.  3- cont  care.  F/u in 1 mo to reck sugars. The patient voiced understanding and agreement to the plan.  [place], DO 07/08/19  11:16 AM

## 2019-07-11 ENCOUNTER — Other Ambulatory Visit: Payer: Self-pay | Admitting: Family Medicine

## 2019-07-11 MED ORDER — GLYBURIDE 5 MG PO TABS: 5.0000 mg | ORAL_TABLET | Freq: Two times a day (BID) | ORAL | 3 refills | Status: DC

## 2019-08-02 ENCOUNTER — Ambulatory Visit: Payer: BC Managed Care – PPO | Admitting: Family Medicine

## 2019-08-02 ENCOUNTER — Encounter: Payer: Self-pay | Admitting: Family Medicine

## 2019-08-02 ENCOUNTER — Other Ambulatory Visit: Payer: Self-pay

## 2019-08-02 VITALS — BP 144/100 | HR 99 | Temp 95.2°F | Ht 66.0 in | Wt 176.0 lb

## 2019-08-02 DIAGNOSIS — I10 Essential (primary) hypertension: Secondary | ICD-10-CM | POA: Diagnosis not present

## 2019-08-02 DIAGNOSIS — L282 Other prurigo: Secondary | ICD-10-CM | POA: Diagnosis not present

## 2019-08-02 DIAGNOSIS — E1169 Type 2 diabetes mellitus with other specified complication: Secondary | ICD-10-CM

## 2019-08-02 DIAGNOSIS — E669 Obesity, unspecified: Secondary | ICD-10-CM | POA: Diagnosis not present

## 2019-08-02 MED ORDER — INSULIN DEGLUDEC 100 UNIT/ML ~~LOC~~ SOPN: 15.0000 [IU] | PEN_INJECTOR | Freq: Every day | SUBCUTANEOUS | 3 refills | Status: DC

## 2019-08-02 MED ORDER — TRULICITY 1.5 MG/0.5ML ~~LOC~~ SOAJ: SUBCUTANEOUS | 5 refills | Status: DC

## 2019-08-02 MED ORDER — TRIAMCINOLONE ACETONIDE 0.1 % EX CREA: 1.0000 "application " | TOPICAL_CREAM | Freq: Two times a day (BID) | CUTANEOUS | 0 refills | Status: AC

## 2019-08-02 MED ORDER — CHLORTHALIDONE 25 MG PO TABS: 25.0000 mg | ORAL_TABLET | Freq: Every day | ORAL | 2 refills | Status: DC

## 2019-08-02 MED ORDER — INSULIN DEGLUDEC 100 UNIT/ML ~~LOC~~ SOPN: 16.0000 [IU] | PEN_INJECTOR | Freq: Every day | SUBCUTANEOUS | 3 refills | Status: DC

## 2019-08-02 NOTE — Patient Instructions (Addendum)
Give [place] 2-3 business days to get the results of your labs back.   Keep the diet clean and stay active.  Continue checking your blood pressures.  Continue your current blood pressure medication.   Continue checking your sugars.  Call your eye provider for your yearly appointment.   Let us know if you need anything.

## 2019-08-02 NOTE — Progress Notes (Signed)
Subjective:   Chief Complaint  Patient presents with  . Rash    medication reaction and BS yesterday Tiffany Estrada is a 64 y.o. female here for follow-up of diabetes.   Tiffany Estrada's self monitored glucose range is 100's-300's Patient denies hypoglycemic reactions. She checks her glucose levels 1-3 Patient does not require insulin.   Medications include: Metformin 1000 mg twice daily, glyburide 5 mg daily; she did not do well with glyburide and believes she has an allergy to sulfa Exercise: Walking Diet is fair  Hypertension Patient presents for hypertension follow up. She does monitor home blood pressures. Blood pressures ranging on average from 130-140's/90-100's. She is compliant with medication-Norvasc 5 mg daily, has not been taking losartan 100 mg daily Patient has these side effects of medication: none Diet and exercise as above  Rash Patient continues have an itchy rash over her thighs bilaterally.  She used triamcinolone which did help but it came back after she completed the course.  No new lotions, soaps, topicals, or detergents.  She is not using any scented products.  No drainage, pain, or redness.   Past Medical History:  Diagnosis Date  . Diabetes mellitus type 2 in obese ([place]) 02/14/2016  . Essential hypertension 02/04/2016  . History of chicken pox   . Obesity 02/04/2016  . Tobacco abuse 02/14/2016     Related testing: Date of retinal exam: Due Pneumovax: done  Review of Systems: Pulmonary:  No SOB Cardiovascular:  No chest pain  Objective:  BP (!) 144/100 (BP Location: Left Arm, Patient Position: Sitting, Cuff Size: Normal)   Pulse 99   Temp (!) 95.2 F (35.1 C) (Temporal)   Ht 5\' 6"  (1.676 m)   Wt 176 lb (79.8 kg)   SpO2 98%   BMI 28.41 kg/m  General:  Well developed, well nourished, in no apparent distress Eyes:  Pupils equal and round, sclera anicteric without injection  Lungs:  CTAB, no access msc use Cardio:  RRR, no bruits, no LE  edema Psych: Age appropriate judgment and insight  Assessment:   Diabetes mellitus type 2 in obese ([place]) - Plan: Dulaglutide (TRULICITY) 1.5 0000000 SOPN, insulin degludec (TRESIBA) 100 UNIT/ML FlexTouch Pen  Pruritic rash - Plan: triamcinolone cream (KENALOG) 0.1 %, Ambulatory referral to Dermatology  Essential hypertension - Plan: chlorthalidone (HYGROTON) 25 MG tablet, Basic metabolic panel   Plan:   1-start weekly Trulicity; start nightly insulin at 16 units.  Continue Metformin.  Counseled on diet and exercise. 2-go back on losartan, continue Norvasc, start chlorthalidone.  Continue checking blood pressures at home. 3- 4 weeks of topical steroid.  Refer to dermatology as continuously. F/u in 1 mo. The patient voiced understanding and agreement to the plan.  [place], DO 08/02/19 4:15 PM

## 2019-08-09 ENCOUNTER — Ambulatory Visit: Payer: BC Managed Care – PPO | Admitting: Family Medicine

## 2019-08-10 ENCOUNTER — Telehealth: Payer: Self-pay

## 2019-08-10 ENCOUNTER — Encounter: Payer: Self-pay | Admitting: Gastroenterology

## 2019-08-10 ENCOUNTER — Other Ambulatory Visit: Payer: Self-pay | Admitting: Family Medicine

## 2019-08-10 DIAGNOSIS — Z1211 Encounter for screening for malignant neoplasm of colon: Secondary | ICD-10-CM

## 2019-08-10 NOTE — Telephone Encounter (Signed)
Called the patient and she had called the [place] office they told her it had expired. Did put in new referral for this patient and she will call them to schedule an appointment.

## 2019-08-10 NOTE — Progress Notes (Signed)
ambu

## 2019-08-10 NOTE — Telephone Encounter (Signed)
Looks like the one done in October does not expire until October 2021

## 2019-08-10 NOTE — Telephone Encounter (Signed)
OK to refer if she needs or provide her w contact info. Ty.

## 2019-08-10 NOTE — Telephone Encounter (Signed)
Patient called in to let Dr. Nani Ravens know that the referral to the gastrologist  Has expired and she needs Dr. Nani Ravens to send over a new updated Referral please advise the patient when this is done at [phone]

## 2019-08-15 ENCOUNTER — Telehealth: Payer: Self-pay

## 2019-08-15 NOTE — Telephone Encounter (Signed)
I am unaware of any order but if you are, okay to put in.

## 2019-08-15 NOTE — Telephone Encounter (Signed)
Pt called requesting a nurse visit for diabetic teaching.  She needs help with administering her insulin.  Pt is coming in for nurse visit for this on Wednesday.  Will you please put an order in for this?  Thank you.

## 2019-08-16 ENCOUNTER — Ambulatory Visit: Payer: BC Managed Care – PPO

## 2019-08-16 NOTE — Telephone Encounter (Signed)
No order needs to be entered just needs to be noted by PCP in this message that she needs a NV for diabetic teaching for insulin.

## 2019-08-16 NOTE — Telephone Encounter (Signed)
Just needs to be in this note an order from PCP stating patient needs diabetic teaching for insulin and ok for NV to help with this.

## 2019-08-16 NOTE — Telephone Encounter (Signed)
Pt needs a nurse visit for diabetic teaching for insulin.

## 2019-08-16 NOTE — Telephone Encounter (Signed)
OK to place. Ty.

## 2019-08-17 ENCOUNTER — Ambulatory Visit: Payer: BC Managed Care – PPO | Admitting: *Deleted

## 2019-08-17 ENCOUNTER — Other Ambulatory Visit: Payer: Self-pay

## 2019-08-17 DIAGNOSIS — E1169 Type 2 diabetes mellitus with other specified complication: Secondary | ICD-10-CM

## 2019-08-17 DIAGNOSIS — E669 Obesity, unspecified: Secondary | ICD-10-CM

## 2019-08-17 MED ORDER — PEN NEEDLES 32G X 5 MM MISC: 1.0000 | Freq: Every day | 1 refills | Status: DC

## 2019-08-17 MED ORDER — ALCOHOL SWABS PADS: MEDICATED_PAD | 1 refills | Status: DC

## 2019-08-17 NOTE — Progress Notes (Signed)
Patient came in to be shown how to use insulin pen needles, [place] and Trulicity.  Advised patient on how to use correctly.  Patient gave own injections and were comfortable with giving her own injection.

## 2019-08-25 ENCOUNTER — Other Ambulatory Visit: Payer: Self-pay

## 2019-08-25 DIAGNOSIS — E1169 Type 2 diabetes mellitus with other specified complication: Secondary | ICD-10-CM

## 2019-08-25 MED ORDER — ONETOUCH VERIO VI STRP: ORAL_STRIP | 3 refills | Status: DC

## 2019-08-25 MED ORDER — ALCOHOL SWABS PADS
MEDICATED_PAD | 1 refills | Status: AC
Start: 2019-08-25 — End: ?

## 2019-08-25 MED ORDER — INSULIN DEGLUDEC 100 UNIT/ML ~~LOC~~ SOPN: 16.0000 [IU] | PEN_INJECTOR | Freq: Every day | SUBCUTANEOUS | 3 refills | Status: DC

## 2019-08-25 NOTE — Telephone Encounter (Signed)
Patient called in to see if DR. Wendling could send in a prescription for glucose blood (ONETOUCH VERIO) test strip [id number]   Alcohol Swabs PADS [id number]   Jonetta Speak LANCETS FINE MISC [id number]   Patient also needs a new one touch meter as well   Please send it to  [facility], [place]  [address], [place] [address]  Phone:  [phone] Fax:  [phone]  DEA #:  --

## 2019-08-25 NOTE — Telephone Encounter (Signed)
Medication sent.

## 2019-08-30 ENCOUNTER — Ambulatory Visit (INDEPENDENT_AMBULATORY_CARE_PROVIDER_SITE_OTHER): Payer: BC Managed Care – PPO | Admitting: Family Medicine

## 2019-08-30 ENCOUNTER — Encounter: Payer: Self-pay | Admitting: Family Medicine

## 2019-08-30 ENCOUNTER — Other Ambulatory Visit: Payer: Self-pay

## 2019-08-30 VITALS — BP 132/80 | HR 86 | Temp 95.2°F | Ht 66.0 in | Wt 171.2 lb

## 2019-08-30 DIAGNOSIS — E669 Obesity, unspecified: Secondary | ICD-10-CM

## 2019-08-30 DIAGNOSIS — E1169 Type 2 diabetes mellitus with other specified complication: Secondary | ICD-10-CM | POA: Diagnosis not present

## 2019-08-30 DIAGNOSIS — I10 Essential (primary) hypertension: Secondary | ICD-10-CM

## 2019-08-30 MED ORDER — METFORMIN HCL 500 MG PO TABS: 500.0000 mg | ORAL_TABLET | Freq: Two times a day (BID) | ORAL | 3 refills | Status: DC

## 2019-08-30 MED ORDER — INSULIN DEGLUDEC 100 UNIT/ML ~~LOC~~ SOPN: 20.0000 [IU] | PEN_INJECTOR | Freq: Every day | SUBCUTANEOUS | 3 refills | Status: DC

## 2019-08-30 NOTE — Progress Notes (Signed)
Chief Complaint  Patient presents with  . Follow-up    Subjective: Patient is a 64 y.o. female here for f/u.  Sugars in AM have been running in the mid-high 200's. Diet could be better, portions are smaller. Trying to walk for exercise. Compliant with Tresiba 16 u qhs, Metformin 123XX123 mg bid, Trulicity 1.5 mg weekly. No AE's, reports compliance.    Hypertension Patient presents for hypertension follow up. She does monitor home blood pressures. Blood pressures ranging on average from 130's/80-90's. She is compliant with medications- losartan 100 mg/d, Norvasc 10 mg/d. Patient has these side effects of medication: none Diet/exercise as above.   Past Medical History:  Diagnosis Date  . Diabetes mellitus type 2 in obese ([place]) 02/14/2016  . Essential hypertension 02/04/2016  . History of chicken pox   . Obesity 02/04/2016  . Tobacco abuse 02/14/2016    Objective: BP 132/80 (BP Location: Left Arm, Patient Position: Sitting, Cuff Size: Normal)   Pulse 86   Temp (!) 95.2 F (35.1 C) (Temporal)   Ht 5\' 6"  (1.676 m)   Wt 171 lb 4 oz (77.7 kg)   SpO2 97%   BMI 27.64 kg/m  General: Awake, appears stated age HEENT: MMM, EOMi Heart: RRR, no LE edema Lungs: CTAB, no rales, wheezes or rhonchi. No accessory muscle use Psych: Age appropriate judgment and insight, normal affect and mood  Assessment and Plan: Diabetes mellitus type 2 in obese ([place]) - Plan: insulin degludec (TRESIBA) 100 UNIT/ML FlexTouch Pen  Essential hypertension  1- concern for pancreatic failure. Increase Tresiba to 20 u qhs form 16 u. Ck sugars in AM and PM. Counseled on diet/exercise. 2- Cont meds for now.  F/u in 6 weeks, I want sugar check ins every 2 weeks until then.  The patient voiced understanding and agreement to the plan.  [place], DO 08/30/19  11:03 AM

## 2019-08-30 NOTE — Patient Instructions (Addendum)
Keep the diet clean and stay active.  Sign up for MyChart and send me a message with your sugars in 2 weeks.   Stop the Trulicity for now.  We are now taking 20 units of Tresiba/insulin nightly.   Continue to monitor your sugars. Take in the morning and take a few readings in the afternoon as well.   Let us know if you need anything.

## 2019-09-05 ENCOUNTER — Other Ambulatory Visit: Payer: Self-pay

## 2019-09-05 ENCOUNTER — Ambulatory Visit (AMBULATORY_SURGERY_CENTER): Payer: Self-pay | Admitting: *Deleted

## 2019-09-05 VITALS — Temp 97.1°F | Ht 66.0 in | Wt 175.0 lb

## 2019-09-05 DIAGNOSIS — Z1211 Encounter for screening for malignant neoplasm of colon: Secondary | ICD-10-CM

## 2019-09-05 MED ORDER — CLENPIQ 10-3.5-12 MG-GM -GM/160ML PO SOLN: 1.0000 | ORAL | 0 refills | Status: DC

## 2019-09-05 NOTE — Progress Notes (Signed)
Patient is here in-person for PV. Patient denies any allergies to eggs or soy. Patient denies any problems with anesthesia/sedation. Patient denies any oxygen use at home. Patient denies taking any diet/weight loss medications or blood thinners. Patient is not being treated for MRSA or C-diff. Patient is aware of our care-partner policy and 0000000 safety protocol. EMMI education assisgned to the patient for the procedure, this was explained and instructions given to patient.   Prep Prescription coupon given to the patient.  Completed J&J covid vaccine on 08/11/19.

## 2019-09-14 ENCOUNTER — Encounter: Payer: Self-pay | Admitting: Gastroenterology

## 2019-09-19 ENCOUNTER — Encounter: Payer: BC Managed Care – PPO | Admitting: Gastroenterology

## 2019-10-11 ENCOUNTER — Ambulatory Visit (INDEPENDENT_AMBULATORY_CARE_PROVIDER_SITE_OTHER): Payer: BC Managed Care – PPO | Admitting: Family Medicine

## 2019-10-11 ENCOUNTER — Other Ambulatory Visit: Payer: Self-pay

## 2019-10-11 ENCOUNTER — Encounter: Payer: Self-pay | Admitting: Family Medicine

## 2019-10-11 VITALS — BP 140/82 | HR 84 | Temp 95.3°F | Ht 66.0 in | Wt 177.2 lb

## 2019-10-11 DIAGNOSIS — I10 Essential (primary) hypertension: Secondary | ICD-10-CM | POA: Diagnosis not present

## 2019-10-11 DIAGNOSIS — E1165 Type 2 diabetes mellitus with hyperglycemia: Secondary | ICD-10-CM | POA: Diagnosis not present

## 2019-10-11 DIAGNOSIS — Z794 Long term (current) use of insulin: Secondary | ICD-10-CM

## 2019-10-11 LAB — HEMOGLOBIN A1C: Hgb A1c MFr Bld: 11.2 % — ABNORMAL HIGH (ref 4.6–6.5)

## 2019-10-11 MED ORDER — INSULIN DEGLUDEC 100 UNIT/ML ~~LOC~~ SOPN: 25.0000 [IU] | PEN_INJECTOR | Freq: Every day | SUBCUTANEOUS | 3 refills | Status: DC

## 2019-10-11 MED ORDER — METFORMIN HCL 500 MG PO TABS: 500.0000 mg | ORAL_TABLET | Freq: Two times a day (BID) | ORAL | 3 refills | Status: DC

## 2019-10-11 NOTE — Patient Instructions (Addendum)
If you do not hear anything about your referral in the next 1-2 weeks, call our office and ask for an update.   Keep the diet clean and stay active.  Aim to do some physical exertion for 150 minutes per week. This is typically divided into 5 days per week, 30 minutes per day. The activity should be enough to get your heart rate up. Anything is better than nothing if you have time constraints.  Keep an eye on your blood pressures.   Let us know if you need anything.

## 2019-10-11 NOTE — Progress Notes (Signed)
Subjective:   Chief Complaint  Patient presents with  . Follow-up    Tiffany Estrada is a 64 y.o. female here for follow-up of diabetes.   Tiffany Estrada self monitored glucose range is 200's Patient denies hypoglycemic reactions. She checks her glucose levels 3x/week Patient does require insulin.  tresiba 20 u/d  Medications include: Metformin 500 mg bid Diet is fair.  Exercise: walking  Hypertension Patient presents for hypertension follow up. She does monitor home blood pressures. Blood pressures ranging on average from 130-140's/70-80's. She is compliant with medications- losartan 100 mg/d, Norvasc 10 mg/d. Patient has these side effects of medication: none Diet/exercise as above.   Past Medical History:  Diagnosis Date  . Diabetes mellitus type 2 in obese ([facility]) 02/14/2016  . Essential hypertension 02/04/2016  . History of chicken pox   . Hyperlipidemia   . Obesity 02/04/2016  . Tobacco abuse 02/14/2016     Related testing: Date of retinal exam: Done Pneumovax: done  Objective:  BP 140/82 (BP Location: Left Arm, Patient Position: Sitting, Cuff Size: Normal)   Pulse 84   Temp (!) 95.3 F (35.2 C) (Temporal)   Ht 5\' 6"  (1.676 m)   Wt 177 lb 4 oz (80.4 kg)   SpO2 98%   BMI 28.61 kg/m  General:  Well developed, well nourished, in no apparent distress Eyes:  Pupils equal and round, sclera anicteric without injection  Lungs:  CTAB, no access msc use Cardio:  RRR, no bruits, no LE edema Psych: Age appropriate judgment and insight  Assessment:   Type 2 diabetes mellitus with hyperglycemia, with long-term current use of insulin ([facility]) - Plan: metFORMIN (GLUCOPHAGE) 500 MG tablet, insulin degludec (TRESIBA) 100 UNIT/ML FlexTouch Pen, Ambulatory referral to Endocrinology, Hemoglobin A1c  Essential hypertension   Plan:   1- Refer to endo. Increase Tresiba to 25 u nightly. She did not listen when I stated I wanted to get more freq updates unfortunately. I don't think she  will be at goal today.  2- Decent for her, she will increase her physical activity and monitor at home. Does not wish to go on more medication at this time. I want her closer to 130 SBP. F/u in 3 mo to reck BP. The patient voiced understanding and agreement to the plan.  [place], DO 10/11/19 11:15 AM

## 2019-10-18 ENCOUNTER — Ambulatory Visit (INDEPENDENT_AMBULATORY_CARE_PROVIDER_SITE_OTHER): Payer: BC Managed Care – PPO | Admitting: Internal Medicine

## 2019-10-18 ENCOUNTER — Encounter: Payer: Self-pay | Admitting: Internal Medicine

## 2019-10-18 ENCOUNTER — Other Ambulatory Visit: Payer: Self-pay

## 2019-10-18 VITALS — BP 138/86 | HR 84 | Ht 66.0 in | Wt 179.0 lb

## 2019-10-18 DIAGNOSIS — E785 Hyperlipidemia, unspecified: Secondary | ICD-10-CM | POA: Diagnosis not present

## 2019-10-18 DIAGNOSIS — E1142 Type 2 diabetes mellitus with diabetic polyneuropathy: Secondary | ICD-10-CM | POA: Insufficient documentation

## 2019-10-18 DIAGNOSIS — E1165 Type 2 diabetes mellitus with hyperglycemia: Secondary | ICD-10-CM

## 2019-10-18 DIAGNOSIS — Z794 Long term (current) use of insulin: Secondary | ICD-10-CM | POA: Insufficient documentation

## 2019-10-18 LAB — GLUCOSE, POCT (MANUAL RESULT ENTRY): POC Glucose: 261 mg/dl — AB (ref 70–99)

## 2019-10-18 MED ORDER — PEN NEEDLES 32G X 4 MM MISC: 1.0000 | 6 refills | Status: DC

## 2019-10-18 MED ORDER — NOVOLOG MIX 70/30 FLEXPEN (70-30) 100 UNIT/ML ~~LOC~~ SUPN: 16.0000 [IU] | PEN_INJECTOR | Freq: Two times a day (BID) | SUBCUTANEOUS | 11 refills | Status: DC

## 2019-10-18 NOTE — Progress Notes (Signed)
Name: Tiffany Estrada  MRN/ DOB: [id number], 1955-05-12   Age/ Sex: 64 y.o., female    PCP: Shelda Pal, DO   Reason for Endocrinology Evaluation: Type 2 Diabetes Mellitus     Date of Initial Endocrinology Visit: 10/18/2019     PATIENT IDENTIFIER: Tiffany Estrada is a 64 y.o. female with a past medical history of T2DM, HTN and Dyslipidemia . The patient presented for initial endocrinology clinic visit on 10/18/2019 for consultative assistance with her diabetes management.    HPI: Tiffany Estrada was    Diagnosed with DM in 2017 Prior Medications tried/Intolerance:Trulicity - ineffective  Currently checking blood sugars 2 x / week  Hypoglycemia episodes : no          Hemoglobin A1c has ranged from 7.5% in 2018, peaking at 11.9% in 2020. Patient required assistance for hypoglycemia: no  Patient has required hospitalization within the last 1 year from hyper or hypoglycemia: no  In terms of diet, the patient eats 2 meals a day, snacks for lunch and occasionally at bedtime . Drinks sugar- sweetened beverages.    HOME DIABETES REGIMEN: Tresiba 25 units daily  Metformin 222 mg BID  Trulicity - stopped     Statin: yes ACE-I/ARB: has losartan listed but is not taking  Prior Diabetic Education: no   METER DOWNLOAD SUMMARY: Did not bring    DIABETIC COMPLICATIONS: Microvascular complications:   Neuropathy   Denies: CKD, retinopathy   Last eye exam: Completed 08/2019  Macrovascular complications:    Denies: CAD, PVD, CVA   PAST HISTORY: Past Medical History:  Past Medical History:  Diagnosis Date  . Diabetes mellitus type 2 in obese ([place]) 02/14/2016  . Essential hypertension 02/04/2016  . History of chicken pox   . Hyperlipidemia   . Obesity 02/04/2016  . Tobacco abuse 02/14/2016   Past Surgical History:  Past Surgical History:  Procedure Laterality Date  . ABDOMINAL HYSTERECTOMY    . APPENDECTOMY    . CHOLECYSTECTOMY    . COLONOSCOPY  over 10  years ago   in [place]-normal exam      Social History:  reports that she has been smoking cigarettes. She has been smoking about 0.25 packs per day. She has never used smokeless tobacco. She reports that she does not drink alcohol and does not use drugs. Family History:  Family History  Problem Relation Age of Onset  . Pancreatitis Mother   . Diabetes Mother   . Pancreatitis Sister   . Pancreatitis Maternal Aunt   . Pancreatitis Maternal Uncle   . Colon cancer Neg Hx   . Colon polyps Neg Hx   . Esophageal cancer Neg Hx   . Rectal cancer Neg Hx   . Stomach cancer Neg Hx      HOME MEDICATIONS: Allergies as of 10/18/2019      Reactions   Latex    Penicillins    Red Dye    Shellfish Allergy    Sulfa Antibiotics       Medication List       Accurate as of October 18, 2019 11:19 AM. If you have any questions, ask your nurse or doctor.        acetaminophen 500 MG tablet Commonly known as: TYLENOL Take 2 tablets (1,000 mg total) by mouth every 6 (six) hours as needed.   Alcohol Swabs Pads Use as directed   amLODipine 10 MG tablet Commonly known as: NORVASC Take 1 tablet (10 mg total) by mouth  daily.   atorvastatin 40 MG tablet Commonly known as: LIPITOR Take 1 tablet (40 mg total) by mouth daily.   Clenpiq 10-3.5-12 MG-GM -GM/160ML Soln Generic drug: Sod Picosulfate-Mag Ox-Cit Acd Take 1 kit by mouth as directed.   gabapentin 300 MG capsule Commonly known as: NEURONTIN   insulin degludec 100 UNIT/ML FlexTouch Pen Commonly known as: TRESIBA Inject 0.25 mLs (25 Units total) into the skin daily.   losartan 100 MG tablet Commonly known as: COZAAR Take 1 tablet (100 mg total) by mouth daily.   metFORMIN 500 MG tablet Commonly known as: GLUCOPHAGE Take 1 tablet (500 mg total) by mouth 2 (two) times daily with a meal.   MULTI ADULT GUMMIES PO Take by mouth. Take 1 gummies by mouth daily.   OneTouch Delica Lancets Fine Misc Use to check sugars twice  weekly.   OneTouch Verio test strip Generic drug: glucose blood Use to check sugars twice weekly.   Pen Needles 32G X 5 MM Misc 1 each by Does not apply route daily.        ALLERGIES: Allergies  Allergen Reactions  . Latex   . Penicillins   . Red Dye   . Shellfish Allergy   . Sulfa Antibiotics      REVIEW OF SYSTEMS: A comprehensive ROS was conducted with the patient and is negative except as per HPI and below:  ROS    OBJECTIVE:   VITAL SIGNS: BP 138/86 (BP Location: Right Arm, Patient Position: Sitting, Cuff Size: Normal)   Pulse 84   Ht 5' 6" (1.676 m)   Wt 179 lb (81.2 kg)   SpO2 98%   BMI 28.89 kg/m    PHYSICAL EXAM:  General: Pt appears well and is in NAD  HEENT:  Eyes: External eye exam normal without stare, lid lag or exophthalmos.  EOM intact.   Neck: General: Supple without adenopathy or carotid bruits. Thyroid: Thyroid size normal.  No goiter or nodules appreciated. No thyroid bruit.  Lungs: Clear with good BS bilat with no rales, rhonchi, or wheezes  Heart: RRR with normal S1 and S2 and no gallops; no murmurs; no rub  Abdomen: Normoactive bowel sounds, soft, nontender, without masses or organomegaly palpable  Extremities:  Lower extremities - No pretibial edema. No lesions.  Skin: Normal texture and temperature to palpation.   Neuro: MS is good with appropriate affect, pt is alert and Ox3     DATA REVIEWED:  Lab Results  Component Value Date   HGBA1C 11.2 (H) 10/11/2019   HGBA1C 11.7 (H) 07/08/2019   HGBA1C 11.9 (H) 02/25/2019   Lab Results  Component Value Date   MICROALBUR 1.6 02/25/2019   LDLCALC 89 07/08/2019   CREATININE 0.61 07/08/2019   Lab Results  Component Value Date   MICRALBCREAT 2.3 02/25/2019    Lab Results  Component Value Date   CHOL 157 07/08/2019   HDL 53.60 07/08/2019   LDLCALC 89 07/08/2019   TRIG 76.0 07/08/2019   CHOLHDL 3 07/08/2019         In office BG 261 mg/dL   ASSESSMENT / PLAN /  RECOMMENDATIONS:   1) Type 2 Diabetes Mellitus, Poorly controlled, With neuropathic  complications - Most recent A1c of 11.2 %. Goal A1c < 7.0 %.    - I have discussed with the patient the pathophysiology of diabetes. We went over the natural progression of the disease. We talked about both insulin resistance and insulin deficiency. We stressed the importance of lifestyle changes including  diet and exercise. I explained the complications associated with diabetes including retinopathy, nephropathy, neuropathy as well as increased risk of cardiovascular disease. We went over the benefit seen with glycemic control.   - I explained to the patient that diabetic patients are at higher than normal risk for amputations.  - I suspect she is glucose toxic, hence the need for higher doses of insulin. We discussed the pharmaokinetics of basal/bolus insulin , switching her to this regimen would cause hardship on the pt, we have opted to try the insulin mix for now.  - She is not willing to give sugar-sweetened beverages, stating she can not drink diet drinks.    MEDICATIONS: - Stop Tyler Aas  - Start Novolog Mix 16 units with Breakfast and 16 units with Supper - Continue Metformin 500 mg, 1 tablet twice a day    EDUCATION / INSTRUCTIONS:  BG monitoring instructions: Patient is instructed to check her blood sugars 2 times a day, fasting and supper  Call [facility] if: BG persistently < 70  . I reviewed the Rule of 15 for the treatment of hypoglycemia in detail with the patient. Literature supplied.   2) Diabetic complications:   Eye: Does not have known diabetic retinopathy.   Neuro/ Feet: Does  have known diabetic peripheral neuropathy.  Renal: Patient does not have known baseline CKD. She has losartan on her list but she is not taking it, I have discussed the renal benefits of ARB to the pt.   3) Lipids: Patient is on Atorvastatin. Discussed cardiovascular benefits of statins.  LDL is trending down.     F/U in 4 months     Signed electronically by: Mack Guise, MD  [facility]  [facility] [address] [place], [place] [address] Phone: [phone] FAX: [phone]   CC: Shelda Pal, [place] [address] [place] [address] Phone: [phone]  Fax: [phone]    Return to Endocrinology clinic as below: Future Appointments  Date Time Provider [place]  11/02/2019 11:00 AM Jackquline Denmark, MD [facility]  01/16/2020 10:00 AM Shelda Pal, DO [facility]

## 2019-10-18 NOTE — Patient Instructions (Addendum)
-   Stop Tyler Aas  - Start Novolog Mix 16 units with Breakfast and 16 units with Supper - Continue Metformin 500 mg, 1 tablet twice a day      - Check sugar before breakfast and supper when you can      -HOW TO TREAT LOW BLOOD SUGARS (Blood sugar LESS THAN 70 MG/DL)  Please follow the RULE OF 15 for the treatment of hypoglycemia treatment (when your (blood sugars are less than 70 mg/dL)    STEP 1: Take 15 grams of carbohydrates when your blood sugar is low, which includes:   3-4 GLUCOSE TABS  OR  3-4 OZ OF JUICE OR REGULAR SODA OR  ONE TUBE OF GLUCOSE GEL     STEP 2: RECHECK blood sugar in 15 MINUTES STEP 3: If your blood sugar is still low at the 15 minute recheck --> then, go back to STEP 1 and treat AGAIN with another 15 grams of carbohydrates.

## 2019-10-19 ENCOUNTER — Encounter: Payer: BC Managed Care – PPO | Admitting: Gastroenterology

## 2019-11-02 ENCOUNTER — Encounter: Payer: BC Managed Care – PPO | Admitting: Gastroenterology

## 2019-12-09 ENCOUNTER — Other Ambulatory Visit: Payer: Self-pay

## 2019-12-09 ENCOUNTER — Encounter: Payer: Self-pay | Admitting: Gastroenterology

## 2019-12-09 ENCOUNTER — Ambulatory Visit (AMBULATORY_SURGERY_CENTER): Payer: BC Managed Care – PPO | Admitting: Gastroenterology

## 2019-12-09 VITALS — BP 112/73 | HR 81 | Temp 97.1°F | Resp 30 | Ht 66.0 in | Wt 175.0 lb

## 2019-12-09 DIAGNOSIS — Z1211 Encounter for screening for malignant neoplasm of colon: Secondary | ICD-10-CM

## 2019-12-09 DIAGNOSIS — D123 Benign neoplasm of transverse colon: Secondary | ICD-10-CM

## 2019-12-09 MED ORDER — SODIUM CHLORIDE 0.9 % IV SOLN: 500.0000 mL | Freq: Once | INTRAVENOUS | Status: DC

## 2019-12-09 NOTE — Progress Notes (Signed)
Patient consents to observer being present for procedure.   

## 2019-12-09 NOTE — Progress Notes (Signed)
Called to room to assist during endoscopic procedure.  Patient ID and intended procedure confirmed with present staff. Received instructions for my participation in the procedure from the performing physician.  

## 2019-12-09 NOTE — Op Note (Signed)
[facility] Patient Name: Tiffany Estrada Procedure Date: 12/09/2019 1:58 PM MRN: [id number] Endoscopist: Jackquline Denmark , MD Age: 64 Referring MD:  Date of Birth: February 19, 1956 Gender: Female Account #: [id number] Procedure:                Colonoscopy Indications:              Screening for colorectal malignant neoplasm Medicines:                Monitored Anesthesia Care Procedure:                Pre-Anesthesia Assessment:                           - Prior to the procedure, a History and Physical                            was performed, and patient medications and                            allergies were reviewed. The patient's tolerance of                            previous anesthesia was also reviewed. The risks                            and benefits of the procedure and the sedation                            options and risks were discussed with the patient.                            All questions were answered, and informed consent                            was obtained. Prior Anticoagulants: The patient has                            taken no previous anticoagulant or antiplatelet                            agents. ASA Grade Assessment: II - A patient with                            mild systemic disease. After reviewing the risks                            and benefits, the patient was deemed in                            satisfactory condition to undergo the procedure.                           After obtaining informed consent, the colonoscope  was passed under direct vision. Throughout the                            procedure, the patient's blood pressure, pulse, and                            oxygen saturations were monitored continuously. The                            Colonoscope was introduced through the anus and                            advanced to the 2 cm into the ileum. The                            colonoscopy was performed  without difficulty. The                            patient tolerated the procedure well. The quality                            of the bowel preparation was good except in the                            right side of the colon where there was adherent                            dark stool which could not be fully washed. The                            terminal ileum, ileocecal valve, appendiceal                            orifice, and rectum were photographed. Scope In: 2:04:59 PM Scope Out: 2:20:27 PM Scope Withdrawal Time: 0 hours 11 minutes 52 seconds  Total Procedure Duration: 0 hours 15 minutes 28 seconds  Findings:                 A 8 mm polyp was found in the proximal transverse                            colon. The polyp was sessile. The polyp was removed                            with a cold snare. Resection and retrieval were                            complete.                           Multiple small-mouthed diverticula were found in                            the sigmoid colon  and descending colon. Few rare                            diverticula in the ascending colon.                           Non-bleeding internal hemorrhoids were found during                            retroflexion. The hemorrhoids were small.                           The terminal ileum appeared normal. Complications:            No immediate complications. Estimated Blood Loss:     Estimated blood loss: none. Impression:               - One 8 mm polyp in the proximal transverse colon,                            removed with a cold snare. Resected and retrieved.                           - Mild predominantly left colonic diverticulosis.                           - Otherwise normal colonoscopy to TI. Recommendation:           - Patient has a contact number available for                            emergencies. The signs and symptoms of potential                            delayed complications were  discussed with the                            patient. Return to normal activities tomorrow.                            Written discharge instructions were provided to the                            patient.                           - High fiber diet.                           - Continue present medications.                           - Await pathology results.                           - Repeat colonoscopy for surveillance based on  pathology results.                           - Return to GI office PRN.                           - D/W Cannon Kettle, MD 12/09/2019 2:25:34 PM This report has been signed electronically.

## 2019-12-09 NOTE — Progress Notes (Signed)
Report given to PACU, vss 

## 2019-12-09 NOTE — Patient Instructions (Signed)
Thank you for letting us take care of your healthcare needs today. Please see handouts given to you on Polyps, Diverticulosis, Hemorrhoids and High Fiber diet.     YOU HAD AN ENDOSCOPIC PROCEDURE TODAY AT [facility]:   Refer to the procedure report that was given to you for any specific questions about what was found during the examination.  If the procedure report does not answer your questions, please call your gastroenterologist to clarify.  If you requested that your care partner not be given the details of your procedure findings, then the procedure report has been included in a sealed envelope for you to review at your convenience later.  YOU SHOULD EXPECT: Some feelings of bloating in the abdomen. Passage of more gas than usual.  Walking can help get rid of the air that was put into your GI tract during the procedure and reduce the bloating. If you had a lower endoscopy (such as a colonoscopy or flexible sigmoidoscopy) you may notice spotting of blood in your stool or on the toilet paper. If you underwent a bowel prep for your procedure, you may not have a normal bowel movement for a few days.  Please Note:  You might notice some irritation and congestion in your nose or some drainage.  This is from the oxygen used during your procedure.  There is no need for concern and it should clear up in a day or so.  SYMPTOMS TO REPORT IMMEDIATELY:   Following lower endoscopy (colonoscopy or flexible sigmoidoscopy):  Excessive amounts of blood in the stool  Significant tenderness or worsening of abdominal pains  Swelling of the abdomen that is new, acute  Fever of 100F or higher   For urgent or emergent issues, a gastroenterologist can be reached at any hour by calling [phone]. Do not use MyChart messaging for urgent concerns.    DIET:  We do recommend a small meal at first, but then you may proceed to your regular diet.  Drink plenty of fluids but you should avoid  alcoholic beverages for 24 hours.  ACTIVITY:  You should plan to take it easy for the rest of today and you should NOT DRIVE or use heavy machinery until tomorrow (because of the sedation medicines used during the test).    FOLLOW UP: Our staff will call the number listed on your records 48-72 hours following your procedure to check on you and address any questions or concerns that you may have regarding the information given to you following your procedure. If we do not reach you, we will leave a message.  We will attempt to reach you two times.  During this call, we will ask if you have developed any symptoms of COVID 19. If you develop any symptoms (ie: fever, flu-like symptoms, shortness of breath, cough etc.) before then, please call [phone].  If you test positive for Covid 19 in the 2 weeks post procedure, please call and report this information to [place].    If any biopsies were taken you will be contacted by phone or by letter within the next 1-3 weeks.  Please call us at [phone] if you have not heard about the biopsies in 3 weeks.    SIGNATURES/CONFIDENTIALITY: You and/or your care partner have signed paperwork which will be entered into your electronic medical record.  These signatures attest to the fact that that the information above on your After Visit Summary has been reviewed and is understood.  Full responsibility of  the confidentiality of this discharge information lies with you and/or your care-partner. 

## 2019-12-13 ENCOUNTER — Telehealth: Payer: Self-pay

## 2019-12-13 NOTE — Telephone Encounter (Signed)
  Follow up Call-  Call back number 12/09/2019  Post procedure Call Back phone  # [phone]  Permission to leave phone message Yes  Some recent data might be hidden     Patient questions:  Do you have a fever, pain , or abdominal swelling? No. Pain Score  0 *  Have you tolerated food without any problems? Yes.    Have you been able to return to your normal activities? Yes.    Do you have any questions about your discharge instructions: Diet   No. Medications  No. Follow up visit  No.  Do you have questions or concerns about your Care? No.  Actions: * If pain score is 4 or above: No action needed, pain <4.  1. Have you developed a fever since your procedure? No   2.   Have you had an respiratory symptoms (SOB or cough) since your procedure? No   3.   Have you tested positive for COVID 19 since your procedure? No   4.   Have you had any family members/close contacts diagnosed with the COVID 19 since your procedure?  No    If yes to any of these questions please route to Joylene John, RN and Erenest Rasher, RN

## 2019-12-20 ENCOUNTER — Encounter: Payer: Self-pay | Admitting: Gastroenterology

## 2020-01-16 ENCOUNTER — Telehealth: Payer: Self-pay | Admitting: Family Medicine

## 2020-01-16 ENCOUNTER — Encounter: Payer: Self-pay | Admitting: Family Medicine

## 2020-01-16 ENCOUNTER — Other Ambulatory Visit: Payer: Self-pay

## 2020-01-16 ENCOUNTER — Ambulatory Visit: Payer: BC Managed Care – PPO | Admitting: Family Medicine

## 2020-01-16 VITALS — BP 134/80 | HR 85 | Temp 98.7°F | Ht 66.0 in | Wt 179.0 lb

## 2020-01-16 DIAGNOSIS — B0230 Zoster ocular disease, unspecified: Secondary | ICD-10-CM | POA: Diagnosis not present

## 2020-01-16 DIAGNOSIS — I10 Essential (primary) hypertension: Secondary | ICD-10-CM | POA: Diagnosis not present

## 2020-01-16 MED ORDER — AMLODIPINE BESYLATE 10 MG PO TABS: 10.0000 mg | ORAL_TABLET | Freq: Every day | ORAL | 11 refills | Status: DC

## 2020-01-16 MED ORDER — LOSARTAN POTASSIUM 100 MG PO TABS: 100.0000 mg | ORAL_TABLET | Freq: Every day | ORAL | 11 refills | Status: DC

## 2020-01-16 MED ORDER — PREDNISOLONE ACETATE 1 % OP SUSP: 1.0000 [drp] | Freq: Four times a day (QID) | OPHTHALMIC | 0 refills | Status: DC

## 2020-01-16 MED ORDER — VALACYCLOVIR HCL 1 G PO TABS: 1000.0000 mg | ORAL_TABLET | Freq: Three times a day (TID) | ORAL | 0 refills | Status: AC

## 2020-01-16 NOTE — Patient Instructions (Addendum)
Give [place] 2-3 business days to get the results of your labs back.   Keep the diet clean and stay active.  If you do not hear anything about your referral in the next day or so or if you can't get in with Dr. Zenia Resides today or tomorrow, call our office.  I recommend getting the flu shot in mid October. This suggestion would change if the CDC comes out with a different recommendation.   Call Center for [place] at [facility] at [phone] for an appointment.  They are located at [address], [address], [place], [place], [address] (right across the hall from our office).  Let us know if you need anything.

## 2020-01-16 NOTE — Progress Notes (Signed)
Chief Complaint  Patient presents with   Follow-up    3 month   Rash    face    Subjective Tiffany Estrada is a 64 y.o. female who presents for hypertension follow up. She does monitor home blood pressures. Blood pressures ranging from 130's/80's on average. She is compliant with medications- Norvasc 10 mg/d, losartan 100 mg/d. Patient has these side effects of medication: none She is adhering to a healthy diet overall. Current exercise: walking  1 week ago started to have burning and itching over face. Started to blister and is now w pain and swelling. No sick contacts. No new topicals. Vision is slightly blurred.    Past Medical History:  Diagnosis Date   Diabetes mellitus type 2 in obese ([place]) 02/14/2016   Essential hypertension 02/04/2016   History of chicken pox    Hyperlipidemia    Neuromuscular disorder ([place])    Obesity 02/04/2016   Tobacco abuse 02/14/2016    Exam BP 134/80 (BP Location: Left Arm, Patient Position: Sitting, Cuff Size: Normal)    Pulse 85    Temp 98.7 F (37.1 C) (Oral)    Ht 5\' 6"  (1.676 m)    Wt 179 lb (81.2 kg)    SpO2 97%    BMI 28.89 kg/m  General:  well developed, well nourished, in no apparent distress Heart: RRR, no bruits, no LE edema Lungs: clear to auscultation, no accessory muscle use Skin: See below. +Hutchinson's sign.  Psych: well oriented with normal range of affect and appropriate judgment/insight      Essential hypertension - Plan: losartan (COZAAR) 100 MG tablet, amLODipine (NORVASC) 10 MG tablet  Herpes zoster ophthalmicus - Plan: valACYclovir (VALTREX) 1000 MG tablet, prednisoLONE acetate (PRED FORTE) 1 % ophthalmic suspension, Ambulatory referral to Ophthalmology  1. Refills as above. Cont. Counseled on diet and exercise. 2. Valtrex, steroid eye drop, urgent referral to ophtho. She sees Dr. Zenia Resides, so will try to get her in there first and she will call when she leaves office.  F/u in 6 mo for CPE or prn. The  patient voiced understanding and agreement to the plan.  [place], DO 01/16/20  10:19 AM

## 2020-01-16 NOTE — Telephone Encounter (Signed)
Called Patient to get more info on the Dr. Zenia Resides Ophthalmology  she saw. Address phone number (Anything )

## 2020-01-24 ENCOUNTER — Telehealth: Payer: Self-pay | Admitting: Family Medicine

## 2020-01-24 NOTE — Telephone Encounter (Signed)
Caller name: Adaleen Call back number:[phone]  Patient wants to know how long is the rash supposed to last. She is wondering if she is supposed to get refill on valacyclovir & prednisolone acetate 1%.

## 2020-01-27 NOTE — Telephone Encounter (Signed)
Pt seen 01/16/2020, if not improving or getting worse needs another appt please.

## 2020-02-16 ENCOUNTER — Other Ambulatory Visit (HOSPITAL_BASED_OUTPATIENT_CLINIC_OR_DEPARTMENT_OTHER): Payer: Self-pay | Admitting: Family Medicine

## 2020-02-16 DIAGNOSIS — Z1231 Encounter for screening mammogram for malignant neoplasm of breast: Secondary | ICD-10-CM

## 2020-02-20 ENCOUNTER — Encounter: Payer: Self-pay | Admitting: Family Medicine

## 2020-02-20 ENCOUNTER — Ambulatory Visit: Payer: BC Managed Care – PPO | Admitting: Family Medicine

## 2020-02-20 ENCOUNTER — Other Ambulatory Visit: Payer: Self-pay

## 2020-02-20 VITALS — BP 118/82 | HR 92 | Temp 97.9°F | Ht 66.0 in | Wt 177.0 lb

## 2020-02-20 DIAGNOSIS — M79672 Pain in left foot: Secondary | ICD-10-CM

## 2020-02-20 MED ORDER — PREDNISONE 20 MG PO TABS: 40.0000 mg | ORAL_TABLET | Freq: Every day | ORAL | 0 refills | Status: AC

## 2020-02-20 NOTE — Patient Instructions (Signed)
Ice/cold pack over area for 10-15 min twice daily.  Wear the boot when you are walking.   Elevate the leg.  Let me know Wednesday afternoon or Thursday if you aren't turning the corner.   OK to take Tylenol 1000 mg (2 extra strength tabs) or 975 mg (3 regular strength tabs) every 6 hours as needed.  Let your endocrinologist know I put you on 5 days of prednisone.   Let us know if you need anything.

## 2020-02-20 NOTE — Progress Notes (Signed)
Musculoskeletal Exam  Patient: Tiffany Estrada DOB: [birth year]  DOS: 02/20/2020  SUBJECTIVE:  Chief Complaint:   Chief Complaint  Patient presents with  . Foot Pain    left    Tiffany Estrada is a 64 y.o.  female for evaluation and treatment of L heel pain.   Onset:  1 month ago. No inj or change in activity.  Location: Medial L heel/achilles area  Character:  aching and sharp  Progression of issue:  is unchanged Associated symptoms: swelling; no redness or bruising Treatment: to date has been OTC NSAIDS.   Neurovascular symptoms: no  Past Medical History:  Diagnosis Date  . Diabetes mellitus type 2 in obese ([place]) 02/14/2016  . Essential hypertension 02/04/2016  . History of chicken pox   . Hyperlipidemia   . Neuromuscular disorder ([place])   . Obesity 02/04/2016  . Tobacco abuse 02/14/2016    Objective: VITAL SIGNS: BP 118/82 (BP Location: Left Arm, Patient Position: Sitting, Cuff Size: Normal)   Pulse 92   Temp 97.9 F (36.6 C) (Oral)   Ht 5\' 6"  (1.676 m)   Wt 177 lb (80.3 kg)   SpO2 97%   BMI 28.57 kg/m  Constitutional: Well formed, well developed. No acute distress. Thorax & Lungs: No accessory muscle use Musculoskeletal: L ankle/heel.   Normal active range of motion: yes.   Normal passive range of motion: Yes Tenderness to palpation: yes over distal calcaneal tendon and just medial; there is a small area of swelling with warmth Deformity: no Ecchymosis: no Tests positive: none Tests negative: Squeeze Neurologic: Normal sensory function. Antalgic gait Psychiatric: Normal mood. Age appropriate judgment and insight. Alert & oriented x 3.    Assessment:  Pain of left heel - Plan: predniSONE (DELTASONE) 20 MG tablet  Plan: Stretches/exercises, heat, ice, Tylenol.  5 d pred burst 40 mg/d. Will monitor sugars while on this, she will see her endo tomorrow. CAM boot to decrease flexion/extension.  F/u prn. The patient voiced understanding and agreement to the  plan.   [place], DO 02/20/20  4:22 PM

## 2020-02-21 ENCOUNTER — Ambulatory Visit: Payer: BC Managed Care – PPO | Admitting: Internal Medicine

## 2020-02-21 ENCOUNTER — Encounter: Payer: Self-pay | Admitting: Internal Medicine

## 2020-02-21 VITALS — BP 136/82 | HR 68 | Ht 66.0 in | Wt 176.2 lb

## 2020-02-21 DIAGNOSIS — E1142 Type 2 diabetes mellitus with diabetic polyneuropathy: Secondary | ICD-10-CM | POA: Diagnosis not present

## 2020-02-21 DIAGNOSIS — E1165 Type 2 diabetes mellitus with hyperglycemia: Secondary | ICD-10-CM

## 2020-02-21 DIAGNOSIS — Z794 Long term (current) use of insulin: Secondary | ICD-10-CM | POA: Diagnosis not present

## 2020-02-21 LAB — POCT GLYCOSYLATED HEMOGLOBIN (HGB A1C): Hemoglobin A1C: 9.2 % — AB (ref 4.0–5.6)

## 2020-02-21 LAB — GLUCOSE, POCT (MANUAL RESULT ENTRY): POC Glucose: 184 mg/dl — AB (ref 70–99)

## 2020-02-21 MED ORDER — FREESTYLE LIBRE 2 READER DEVI: 1.0000 | 0 refills | Status: DC

## 2020-02-21 MED ORDER — NOVOLOG MIX 70/30 FLEXPEN (70-30) 100 UNIT/ML ~~LOC~~ SUPN
20.0000 [IU] | PEN_INJECTOR | Freq: Two times a day (BID) | SUBCUTANEOUS | 6 refills | Status: DC
Start: 2020-02-21 — End: 2020-06-26

## 2020-02-21 MED ORDER — FREESTYLE LIBRE 2 SENSOR MISC: 1.0000 | 1 refills | Status: DC

## 2020-02-21 NOTE — Progress Notes (Signed)
Name: Tiffany Estrada  Age/ Sex: 64 y.o., female   MRN/ DOB: [id number], October 18, 1955     PCP: Shelda Pal, DO   Reason for Endocrinology Evaluation: Type 2 Diabetes Mellitus  Initial Endocrine Consultative Visit: 10/18/2019    PATIENT IDENTIFIER: Tiffany Estrada is a 64 y.o. female with a past medical history of T2DM, HTN and Dyslipidemia. The patient has followed with Endocrinology clinic since 10/18/2019 for consultative assistance with management of her diabetes.  DIABETIC HISTORY:  Ms. Krell was diagnosed with Y0VP in 7106, trulicity has been in effective.  Her hemoglobin A1c has ranged from 7.5% in 2018, peaking at 11.9% in 2020.  On her initial visit to our clinic she had an A1c of 11.2%   , she was on Tresiba, and Metformin. We continued metformin , switched basal insulin to insulin Mix.      SUBJECTIVE:   During the last visit (10/18/2019): A1c 11.2%. We  switched basal insulin to Novolog mix and continued metformin   Today (02/21/2020): Ms. Frasier is here for a follow up on diabetes management.  She checks her blood sugars 1 times daily, preprandial to breakfast. The patient has not had hypoglycemic episodes since the last clinic visit.    HOME DIABETES REGIMEN:  Metformin 500 mg BID- takes it once a day  Novolog Mix 16 units BID       Statin: yes ACE-I/ARB: Had losartan listed but not taking     GLUCOSE LOG:  165- 314 mg/dL    DIABETIC COMPLICATIONS: Microvascular complications:   Neuropathy  Denies: CKD , retinopathy  Last Eye Exam: Completed 08/2019  Macrovascular complications:    Denies: CAD, CVA, PVD   HISTORY:  Past Medical History:  Past Medical History:  Diagnosis Date  . Diabetes mellitus type 2 in obese ([place]) 02/14/2016  . Essential hypertension 02/04/2016  . History of chicken pox   . Hyperlipidemia   . Neuromuscular disorder ([place])   . Obesity 02/04/2016  . Tobacco abuse 02/14/2016   Past Surgical History:  Past  Surgical History:  Procedure Laterality Date  . ABDOMINAL HYSTERECTOMY    . APPENDECTOMY    . CHOLECYSTECTOMY    . COLONOSCOPY  over 10 years ago   in [place]-normal exam    Social History:  reports that she has been smoking cigarettes. She has been smoking about 0.25 packs per day. She has never used smokeless tobacco. She reports that she does not drink alcohol and does not use drugs. Family History:  Family History  Problem Relation Age of Onset  . Pancreatitis Mother   . Diabetes Mother   . Pancreatitis Sister   . Pancreatitis Maternal Aunt   . Pancreatitis Maternal Uncle   . Colon cancer Neg Hx   . Colon polyps Neg Hx   . Esophageal cancer Neg Hx   . Rectal cancer Neg Hx   . Stomach cancer Neg Hx      HOME MEDICATIONS: Allergies as of 02/21/2020      Reactions   Latex    Penicillins    Red Dye    Shellfish Allergy    Sulfa Antibiotics       Medication List       Accurate as of February 21, 2020  2:53 PM. If you have any questions, ask your nurse or doctor.        acetaminophen 500 MG tablet Commonly known as: TYLENOL Take 2 tablets (1,000 mg total) by mouth every 6 (six)  hours as needed.   Alcohol Swabs Pads Use as directed   amLODipine 10 MG tablet Commonly known as: NORVASC Take 1 tablet (10 mg total) by mouth daily.   atorvastatin 40 MG tablet Commonly known as: LIPITOR Take 1 tablet (40 mg total) by mouth daily.   FreeStyle Libre 2 Reader Devi 1 Device by Does not apply route as directed. Started by: Dorita Sciara, MD   FreeStyle Libre 2 Sensor Misc 1 Device by Does not apply route as directed. Started by: Dorita Sciara, MD   gabapentin 300 MG capsule Commonly known as: NEURONTIN   losartan 100 MG tablet Commonly known as: COZAAR Take 1 tablet (100 mg total) by mouth daily.   metFORMIN 500 MG tablet Commonly known as: GLUCOPHAGE Take 1 tablet (500 mg total) by mouth 2 (two) times daily with a meal.   MULTI ADULT  GUMMIES PO Take by mouth. Take 1 gummies by mouth daily.   NovoLOG Mix 70/30 FlexPen (70-30) 100 UNIT/ML FlexPen Generic drug: insulin aspart protamine - aspart Inject 0.16 mLs (16 Units total) into the skin 2 (two) times daily.   OneTouch Delica Lancets Fine Misc Use to check sugars twice weekly.   OneTouch Verio test strip Generic drug: glucose blood Use to check sugars twice weekly.   Pen Needles 32G X 4 MM Misc 1 Device by Does not apply route as directed.   prednisoLONE acetate 1 % ophthalmic suspension Commonly known as: Pred Forte Place 1 drop into the left eye 4 (four) times daily.   predniSONE 20 MG tablet Commonly known as: DELTASONE Take 2 tablets (40 mg total) by mouth daily with breakfast for 5 days.        OBJECTIVE:   Vital Signs: BP 136/82   Pulse 68   Ht 5\' 6"  (1.676 m)   Wt 176 lb 4 oz (79.9 kg)   SpO2 98%   BMI 28.45 kg/m   Wt Readings from Last 3 Encounters:  02/21/20 176 lb 4 oz (79.9 kg)  02/20/20 177 lb (80.3 kg)  01/16/20 179 lb (81.2 kg)     Exam: General: Pt appears well and is in NAD  Lungs: Clear with good BS bilat with no rales, rhonchi, or wheezes  Heart: RRR with normal S1 and S2 and no gallops; no murmurs; no rub  Extremities: No pretibial edema on the right but has left foot  boot  Neuro: MS is good with appropriate affect, pt is alert and Ox3      DATA REVIEWED:  Lab Results  Component Value Date   HGBA1C 9.2 (A) 02/21/2020   HGBA1C 11.2 (H) 10/11/2019   HGBA1C 11.7 (H) 07/08/2019   Lab Results  Component Value Date   MICROALBUR 1.6 02/25/2019   LDLCALC 89 07/08/2019   CREATININE 0.61 07/08/2019   Lab Results  Component Value Date   MICRALBCREAT 2.3 02/25/2019     Lab Results  Component Value Date   CHOL 157 07/08/2019   HDL 53.60 07/08/2019   LDLCALC 89 07/08/2019   TRIG 76.0 07/08/2019   CHOLHDL 3 07/08/2019         ASSESSMENT / PLAN / RECOMMENDATIONS:   1) Type 2 Diabetes Mellitus, with  improving glycemic control, With neuropathic  complications - Most recent A1c of 9.2%. Goal A1c <7.0 %.    - A1c down from 11.2 %  - We again discussed the importance of medication adherence and following a low carb diet as she unfortunately continues with imperfect  medication adherence and dietary indiscretions . - Pt advised to increase metformin to BID dosing, will also increase insulin as below  - I have encouraged her to check glucose twice a day, take insulin with first and last meal of the day, she was advised to contact us with hyperglycemia due to steroid use ( will be stating tomorrow) as she may end up needing 24 units  - Pt is awaiting right hand sx but we discussed her A1c goal is < 7.5 % to proceed with sx.   MEDICATIONS:  Increase Metformin 500 mg BID   Increase Novolog Mix to 20 units BID   EDUCATION / INSTRUCTIONS:  BG monitoring instructions: Patient is instructed to check her blood sugars 2 times a day, before breakfast and supper .  Call [facility] if: BG persistently < 70 . I reviewed the Rule of 15 for the treatment of hypoglycemia in detail with the patient. Literature supplied.   2) Diabetic complications:   Eye: Does not have known diabetic retinopathy.   Neuro/ Feet: Does have known diabetic peripheral neuropathy .   Renal: Patient does not  have known baseline CKD. She   is not on an ACEI/ARB at present.     F/U in 4 months    Signed electronically by: Mack Guise, MD  [facility] Endocrinology  [facility] [address] [place], [place] [address] Phone: [phone] FAX: [phone]   CC: Shelda Pal, [address] [place] [address] Phone: [phone]  Fax: [phone]  Return to Endocrinology clinic as below: Future Appointments  Date Time Provider [place]  04/03/2020 10:20 AM [facility]  06/26/2020  9:50 AM Alquan Morrish,  Melanie Crazier, MD [facility]  07/17/2020 10:30 AM Shelda Pal, DO [facility]

## 2020-02-21 NOTE — Patient Instructions (Addendum)
-   Novolog Mix 20 units with Breakfast and 20 units with Supper - Continue Metformin 500 mg, 1 tablet twice a day       -HOW TO TREAT LOW BLOOD SUGARS (Blood sugar LESS THAN 70 MG/DL)  Please follow the RULE OF 15 for the treatment of hypoglycemia treatment (when your (blood sugars are less than 70 mg/dL)    STEP 1: Take 15 grams of carbohydrates when your blood sugar is low, which includes:   3-4 GLUCOSE TABS  OR  3-4 OZ OF JUICE OR REGULAR SODA OR  ONE TUBE OF GLUCOSE GEL     STEP 2: RECHECK blood sugar in 15 MINUTES STEP 3: If your blood sugar is still low at the 15 minute recheck --> then, go back to STEP 1 and treat AGAIN with another 15 grams of carbohydrates.

## 2020-03-06 ENCOUNTER — Telehealth: Payer: Self-pay

## 2020-03-06 NOTE — Telephone Encounter (Signed)
Called informed of PCP instructions. She is having swelling/heat and pain up into your hip (right Hip). She is taking Gabapentin and she has been taking ibuprofen (takes 2 after eating lunch and dinner). Ok for ibuprofen??

## 2020-03-06 NOTE — Telephone Encounter (Signed)
Ibuprofen is OK. Maybe schedule for Friday, she can cancel, sched tomorrow if it is worsening. Ty.

## 2020-03-06 NOTE — Telephone Encounter (Signed)
Have her monitor for a couple days if tolerable, come in Fri as we might need to do a fluid analysis. Ice and elevation in meanwhile. Ty.

## 2020-03-06 NOTE — Telephone Encounter (Signed)
Patient called stating she is now having swelling in her right leg/kneee, just as she was when she saw Dr. Dorien Chihuahua on 10/18 for the left foot. Please advise.

## 2020-03-06 NOTE — Telephone Encounter (Signed)
Called the patient informed of PCP instructions. Scheduled appointment for Friday 03/09/2020 She will cancel if she gets better

## 2020-03-09 ENCOUNTER — Other Ambulatory Visit: Payer: Self-pay

## 2020-03-09 ENCOUNTER — Encounter: Payer: Self-pay | Admitting: Family Medicine

## 2020-03-09 ENCOUNTER — Ambulatory Visit: Payer: BC Managed Care – PPO | Admitting: Family Medicine

## 2020-03-09 VITALS — BP 140/80 | HR 81 | Temp 98.4°F | Ht 66.0 in | Wt 182.0 lb

## 2020-03-09 DIAGNOSIS — M545 Low back pain, unspecified: Secondary | ICD-10-CM | POA: Diagnosis not present

## 2020-03-09 DIAGNOSIS — L309 Dermatitis, unspecified: Secondary | ICD-10-CM | POA: Diagnosis not present

## 2020-03-09 MED ORDER — CYCLOBENZAPRINE HCL 10 MG PO TABS: 5.0000 mg | ORAL_TABLET | Freq: Three times a day (TID) | ORAL | 0 refills | Status: DC | PRN

## 2020-03-09 MED ORDER — KETOROLAC TROMETHAMINE 60 MG/2ML IM SOLN
60.0000 mg | Freq: Once | INTRAMUSCULAR | Status: AC
  Administered 2020-03-09: 60 mg via INTRAMUSCULAR

## 2020-03-09 MED ORDER — MELOXICAM 15 MG PO TABS: 15.0000 mg | ORAL_TABLET | Freq: Every day | ORAL | 0 refills | Status: DC

## 2020-03-09 MED ORDER — CLOTRIMAZOLE-BETAMETHASONE 1-0.05 % EX CREA: 1.0000 "application " | TOPICAL_CREAM | Freq: Two times a day (BID) | CUTANEOUS | 0 refills | Status: DC

## 2020-03-09 NOTE — Progress Notes (Signed)
Musculoskeletal Exam  Patient: Tiffany Estrada DOB: [birth year]  DOS: 03/09/2020  SUBJECTIVE:  Chief Complaint:   Chief Complaint  Patient presents with  . Leg Pain    right    Tiffany Estrada is a 64 y.o.  female for evaluation and treatment of R leg pain.   Onset:  5 days ago. No inj or change in activity.  Location:  R low back/buttock that radiates down the right lower extremity Character:  burning  Progression of issue:  is unchanged Associated symptoms: Denies bruising, loss of bowel/bladder function, redness, or swelling Treatment: to date has been OTC NSAIDS.   Neurovascular symptoms: no  Patient has a several month history of a scaly rash on the back of her right thigh.  She was prescribed triamcinolone cream that she never used.  She was able to get into the dermatology clinic and was prescribed ketoconazole.  She has been using that daily for the past several months and noticed no improvement.  No pain, drainage, new topicals.  No sick contacts.  Past Medical History:  Diagnosis Date  . Diabetes mellitus type 2 in obese ([place]) 02/14/2016  . Essential hypertension 02/04/2016  . History of chicken pox   . Hyperlipidemia   . Neuromuscular disorder ([place])   . Obesity 02/04/2016  . Tobacco abuse 02/14/2016    Objective: VITAL SIGNS: BP 140/80 (BP Location: Left Arm, Patient Position: Sitting, Cuff Size: Normal)   Pulse 81   Temp 98.4 F (36.9 C) (Oral)   Ht 5\' 6"  (1.676 m)   Wt 182 lb (82.6 kg)   SpO2 98%   BMI 29.38 kg/m  Constitutional: Well formed, well developed. No acute distress. Skin: scaly and excoriated region behind R thigh Musculoskeletal: Low back.   Tenderness to palpation: SI jt, distal ES group on R Deformity: no Ecchymosis: no Tests positive: None Tests negative: Straight leg Neurologic: Normal sensory function. No focal deficits noted. DTR's equal and symmetric in LE's. No clonus. Psychiatric: Normal mood. Age appropriate judgment and  insight. Alert & oriented x 3.    Assessment:  Acute right-sided low back pain without sciatica - Plan: meloxicam (MOBIC) 15 MG tablet, cyclobenzaprine (FLEXERIL) 10 MG tablet, ketorolac (TORADOL) injection 60 mg  Dermatitis - Plan: clotrimazole-betamethasone (LOTRISONE) cream  Plan: 1. Stretches/exercises, heat, ice, Tylenol. Toradol injection today, start Mobic tomorrow.  Warnings about Flexeril verbalized and written down.  Physical therapy versus sports medicine referral if no improvement. 2.  Lotrisone twice daily until resolution.  If no improvement, will biopsy. F/u as originally scheduled. The patient voiced understanding and agreement to the plan.   [place], DO 03/09/20  2:29 PM

## 2020-03-09 NOTE — Patient Instructions (Signed)
Continue gabapentin.  OK to take Tylenol 1000 mg (2 extra strength tabs) or 975 mg (3 regular strength tabs) every 6 hours as needed.  Stop ibuprofen, we are starting meloxicam/Mobic tomorrow.  Ice/cold pack over area for 10-15 min twice daily.  Heat (pad or rice pillow in microwave) over affected area, 10-15 minutes twice daily.   Let us know if you need anything.  EXERCISES  RANGE OF MOTION (ROM) AND STRETCHING EXERCISES - Low Back Pain Most people with lower back pain will find that their symptoms get worse with excessive bending forward (flexion) or arching at the lower back (extension). The exercises that will help resolve your symptoms will focus on the opposite motion.  If you have pain, numbness or tingling which travels down into your buttocks, leg or foot, the goal of the therapy is for these symptoms to move closer to your back and eventually resolve. Sometimes, these leg symptoms will get better, but your lower back pain may worsen. This is often an indication of progress in your rehabilitation. Be very alert to any changes in your symptoms and the activities in which you participated in the 24 hours prior to the change. Sharing this information with your caregiver will allow him or her to most efficiently treat your condition. These exercises may help you when beginning to rehabilitate your injury. Your symptoms may resolve with or without further involvement from your physician, physical therapist or athletic trainer. While completing these exercises, remember:   Restoring tissue flexibility helps normal motion to return to the joints. This allows healthier, less painful movement and activity.  An effective stretch should be held for at least 30 seconds.  A stretch should never be painful. You should only feel a gentle lengthening or release in the stretched tissue. FLEXION RANGE OF MOTION AND STRETCHING EXERCISES:  STRETCH - Flexion, Single Knee to Chest   Lie on a firm bed  or floor with both legs extended in front of you.  Keeping one leg in contact with the floor, bring your opposite knee to your chest. Hold your leg in place by either grabbing behind your thigh or at your knee.  Pull until you feel a gentle stretch in your low back. Hold 30 seconds.  Slowly release your grasp and repeat the exercise with the opposite side. Repeat 2 times. Complete this exercise 3 times per week.   STRETCH - Flexion, Double Knee to Chest  Lie on a firm bed or floor with both legs extended in front of you.  Keeping one leg in contact with the floor, bring your opposite knee to your chest.  Tense your stomach muscles to support your back and then lift your other knee to your chest. Hold your legs in place by either grabbing behind your thighs or at your knees.  Pull both knees toward your chest until you feel a gentle stretch in your low back. Hold 30 seconds.  Tense your stomach muscles and slowly return one leg at a time to the floor. Repeat 2 times. Complete this exercise 3 times per week.   STRETCH - Low Trunk Rotation  Lie on a firm bed or floor. Keeping your legs in front of you, bend your knees so they are both pointed toward the ceiling and your feet are flat on the floor.  Extend your arms out to the side. This will stabilize your upper body by keeping your shoulders in contact with the floor.  Gently and slowly drop both knees together  to one side until you feel a gentle stretch in your low back. Hold for 30 seconds.  Tense your stomach muscles to support your lower back as you bring your knees back to the starting position. Repeat the exercise to the other side. Repeat 2 times. Complete this exercise at least 3 times per week.   EXTENSION RANGE OF MOTION AND FLEXIBILITY EXERCISES:  STRETCH - Extension, Prone on Elbows   Lie on your stomach on the floor, a bed will be too soft. Place your palms about shoulder width apart and at the height of your  head.  Place your elbows under your shoulders. If this is too painful, stack pillows under your chest.  Allow your body to relax so that your hips drop lower and make contact more completely with the floor.  Hold this position for 30 seconds.  Slowly return to lying flat on the floor. Repeat 2 times. Complete this exercise 3 times per week.   RANGE OF MOTION - Extension, Prone Press Ups  Lie on your stomach on the floor, a bed will be too soft. Place your palms about shoulder width apart and at the height of your head.  Keeping your back as relaxed as possible, slowly straighten your elbows while keeping your hips on the floor. You may adjust the placement of your hands to maximize your comfort. As you gain motion, your hands will come more underneath your shoulders.  Hold this position 30 seconds.  Slowly return to lying flat on the floor. Repeat 2 times. Complete this exercise 3 times per week.   RANGE OF MOTION- Quadruped, Neutral Spine   Assume a hands and knees position on a firm surface. Keep your hands under your shoulders and your knees under your hips. You may place padding under your knees for comfort.  Drop your head and point your tailbone toward the ground below you. This will round out your lower back like an angry cat. Hold this position for 30 seconds.  Slowly lift your head and release your tail bone so that your back sags into a large arch, like an old horse.  Hold this position for 30 seconds.  Repeat this until you feel limber in your low back.  Now, find your "sweet spot." This will be the most comfortable position somewhere between the two previous positions. This is your neutral spine. Once you have found this position, tense your stomach muscles to support your low back.  Hold this position for 30 seconds. Repeat 2 times. Complete this exercise 3 times per week.   STRENGTHENING EXERCISES - Low Back Sprain These exercises may help you when beginning to  rehabilitate your injury. These exercises should be done near your "sweet spot." This is the neutral, low-back arch, somewhere between fully rounded and fully arched, that is your least painful position. When performed in this safe range of motion, these exercises can be used for people who have either a flexion or extension based injury. These exercises may resolve your symptoms with or without further involvement from your physician, physical therapist or athletic trainer. While completing these exercises, remember:   Muscles can gain both the endurance and the strength needed for everyday activities through controlled exercises.  Complete these exercises as instructed by your physician, physical therapist or athletic trainer. Increase the resistance and repetitions only as guided.  You may experience muscle soreness or fatigue, but the pain or discomfort you are trying to eliminate should never worsen during these exercises. If  this pain does worsen, stop and make certain you are following the directions exactly. If the pain is still present after adjustments, discontinue the exercise until you can discuss the trouble with your caregiver.  STRENGTHENING - Deep Abdominals, Pelvic Tilt   Lie on a firm bed or floor. Keeping your legs in front of you, bend your knees so they are both pointed toward the ceiling and your feet are flat on the floor.  Tense your lower abdominal muscles to press your low back into the floor. This motion will rotate your pelvis so that your tail bone is scooping upwards rather than pointing at your feet or into the floor. With a gentle tension and even breathing, hold this position for 3 seconds. Repeat 2 times. Complete this exercise 3 times per week.   STRENGTHENING - Abdominals, Crunches   Lie on a firm bed or floor. Keeping your legs in front of you, bend your knees so they are both pointed toward the ceiling and your feet are flat on the floor. Cross your arms over  your chest.  Slightly tip your chin down without bending your neck.  Tense your abdominals and slowly lift your trunk high enough to just clear your shoulder blades. Lifting higher can put excessive stress on the lower back and does not further strengthen your abdominal muscles.  Control your return to the starting position. Repeat 2 times. Complete this exercise 3 times per week.   STRENGTHENING - Quadruped, Opposite UE/LE Lift   Assume a hands and knees position on a firm surface. Keep your hands under your shoulders and your knees under your hips. You may place padding under your knees for comfort.  Find your neutral spine and gently tense your abdominal muscles so that you can maintain this position. Your shoulders and hips should form a rectangle that is parallel with the floor and is not twisted.  Keeping your trunk steady, lift your right hand no higher than your shoulder and then your left leg no higher than your hip. Make sure you are not holding your breath. Hold this position for 30 seconds.  Continuing to keep your abdominal muscles tense and your back steady, slowly return to your starting position. Repeat with the opposite arm and leg. Repeat 2 times. Complete this exercise 3 times per week.   STRENGTHENING - Abdominals and Quadriceps, Straight Leg Raise   Lie on a firm bed or floor with both legs extended in front of you.  Keeping one leg in contact with the floor, bend the other knee so that your foot can rest flat on the floor.  Find your neutral spine, and tense your abdominal muscles to maintain your spinal position throughout the exercise.  Slowly lift your straight leg off the floor about 6 inches for a count of 3, making sure to not hold your breath.  Still keeping your neutral spine, slowly lower your leg all the way to the floor. Repeat this exercise with each leg 2 times. Complete this exercise 3 times per week.  POSTURE AND BODY MECHANICS CONSIDERATIONS -  Low Back Sprain Keeping correct posture when sitting, standing or completing your activities will reduce the stress put on different body tissues, allowing injured tissues a chance to heal and limiting painful experiences. The following are general guidelines for improved posture.  While reading these guidelines, remember:  The exercises prescribed by your provider will help you have the flexibility and strength to maintain correct postures.  The correct posture provides  the best environment for your joints to work. All of your joints have less wear and tear when properly supported by a spine with good posture. This means you will experience a healthier, less painful body.  Correct posture must be practiced with all of your activities, especially prolonged sitting and standing. Correct posture is as important when doing repetitive low-stress activities (typing) as it is when doing a single heavy-load activity (lifting).  RESTING POSITIONS Consider which positions are most painful for you when choosing a resting position. If you have pain with flexion-based activities (sitting, bending, stooping, squatting), choose a position that allows you to rest in a less flexed posture. You would want to avoid curling into a fetal position on your side. If your pain worsens with extension-based activities (prolonged standing, working overhead), avoid resting in an extended position such as sleeping on your stomach. Most people will find more comfort when they rest with their spine in a more neutral position, neither too rounded nor too arched. Lying on a non-sagging bed on your side with a pillow between your knees, or on your back with a pillow under your knees will often provide some relief. Keep in mind, being in any one position for a prolonged period of time, no matter how correct your posture, can still lead to stiffness.  PROPER SITTING POSTURE In order to minimize stress and discomfort on your spine, you  must sit with correct posture. Sitting with good posture should be effortless for a healthy body. Returning to good posture is a gradual process. Many people can work toward this most comfortably by using various supports until they have the flexibility and strength to maintain this posture on their own. When sitting with proper posture, your ears will fall over your shoulders and your shoulders will fall over your hips. You should use the back of the chair to support your upper back. Your lower back will be in a neutral position, just slightly arched. You may place a small pillow or folded towel at the base of your lower back for  support.  When working at a desk, create an environment that supports good, upright posture. Without extra support, muscles tire, which leads to excessive strain on joints and other tissues. Keep these recommendations in mind:  CHAIR:  A chair should be able to slide under your desk when your back makes contact with the back of the chair. This allows you to work closely.  The chair's height should allow your eyes to be level with the upper part of your monitor and your hands to be slightly lower than your elbows.  BODY POSITION  Your feet should make contact with the floor. If this is not possible, use a foot rest.  Keep your ears over your shoulders. This will reduce stress on your neck and low back.  INCORRECT SITTING POSTURES  If you are feeling tired and unable to assume a healthy sitting posture, do not slouch or slump. This puts excessive strain on your back tissues, causing more damage and pain. Healthier options include:  Using more support, like a lumbar pillow.  Switching tasks to something that requires you to be upright or walking.  Talking a brief walk.  Lying down to rest in a neutral-spine position.  PROLONGED STANDING WHILE SLIGHTLY LEANING FORWARD  When completing a task that requires you to lean forward while standing in one place for a long  time, place either foot up on a stationary 2-4 inch high object to  help maintain the best posture. When both feet are on the ground, the lower back tends to lose its slight inward curve. If this curve flattens (or becomes too large), then the back and your other joints will experience too much stress, tire more quickly, and can cause pain.  CORRECT STANDING POSTURES Proper standing posture should be assumed with all daily activities, even if they only take a few moments, like when brushing your teeth. As in sitting, your ears should fall over your shoulders and your shoulders should fall over your hips. You should keep a slight tension in your abdominal muscles to brace your spine. Your tailbone should point down to the ground, not behind your body, resulting in an over-extended swayback posture.   INCORRECT STANDING POSTURES  Common incorrect standing postures include a forward head, locked knees and/or an excessive swayback. WALKING Walk with an upright posture. Your ears, shoulders and hips should all line-up.  PROLONGED ACTIVITY IN A FLEXED POSITION When completing a task that requires you to bend forward at your waist or lean over a low surface, try to find a way to stabilize 3 out of 4 of your limbs. You can place a hand or elbow on your thigh or rest a knee on the surface you are reaching across. This will provide you more stability, so that your muscles do not tire as quickly. By keeping your knees relaxed, or slightly bent, you will also reduce stress across your lower back. CORRECT LIFTING TECHNIQUES  DO :  Assume a wide stance. This will provide you more stability and the opportunity to get as close as possible to the object which you are lifting.  Tense your abdominals to brace your spine. Bend at the knees and hips. Keeping your back locked in a neutral-spine position, lift using your leg muscles. Lift with your legs, keeping your back straight.  Test the weight of unknown objects  before attempting to lift them.  Try to keep your elbows locked down at your sides in order get the best strength from your shoulders when carrying an object.     Always ask for help when lifting heavy or awkward objects. INCORRECT LIFTING TECHNIQUES DO NOT:   Lock your knees when lifting, even if it is a small object.  Bend and twist. Pivot at your feet or move your feet when needing to change directions.  Assume that you can safely pick up even a paperclip without proper posture.

## 2020-04-02 ENCOUNTER — Other Ambulatory Visit: Payer: Self-pay | Admitting: Family Medicine

## 2020-04-02 DIAGNOSIS — I10 Essential (primary) hypertension: Secondary | ICD-10-CM

## 2020-04-03 ENCOUNTER — Ambulatory Visit (HOSPITAL_BASED_OUTPATIENT_CLINIC_OR_DEPARTMENT_OTHER): Payer: BC Managed Care – PPO

## 2020-05-14 ENCOUNTER — Ambulatory Visit (HOSPITAL_BASED_OUTPATIENT_CLINIC_OR_DEPARTMENT_OTHER): Payer: BC Managed Care – PPO

## 2020-05-18 ENCOUNTER — Other Ambulatory Visit: Payer: Self-pay | Admitting: Family Medicine

## 2020-05-18 DIAGNOSIS — Z9189 Other specified personal risk factors, not elsewhere classified: Secondary | ICD-10-CM

## 2020-05-21 ENCOUNTER — Inpatient Hospital Stay (HOSPITAL_BASED_OUTPATIENT_CLINIC_OR_DEPARTMENT_OTHER): Admission: RE | Admit: 2020-05-21 | Payer: Self-pay | Source: Ambulatory Visit

## 2020-05-24 ENCOUNTER — Other Ambulatory Visit: Payer: Self-pay

## 2020-05-24 ENCOUNTER — Ambulatory Visit (HOSPITAL_BASED_OUTPATIENT_CLINIC_OR_DEPARTMENT_OTHER): Payer: Self-pay

## 2020-05-24 ENCOUNTER — Ambulatory Visit (HOSPITAL_BASED_OUTPATIENT_CLINIC_OR_DEPARTMENT_OTHER)
Admission: RE | Admit: 2020-05-24 | Discharge: 2020-05-24 | Disposition: A | Discharge status: Home / Self Care | Payer: BC Managed Care – PPO | Source: Ambulatory Visit | Attending: Family Medicine | Admitting: Family Medicine

## 2020-05-24 DIAGNOSIS — Z1231 Encounter for screening mammogram for malignant neoplasm of breast: Secondary | ICD-10-CM | POA: Diagnosis present

## 2020-06-25 NOTE — Progress Notes (Signed)
Name: Tiffany Estrada  Age/ Sex: 65 y.o., female   MRN/ DOB: [id number], 1955/12/25     PCP: Tiffany Pal, DO   Reason for Endocrinology Evaluation: Type 2 Diabetes Mellitus  Initial Endocrine Consultative Visit: 10/18/2019    PATIENT IDENTIFIER: Tiffany Estrada is a 65 y.o. female with a past medical history of T2DM, HTN and Dyslipidemia. The patient has followed with Endocrinology clinic since 10/18/2019 for consultative assistance with management of her diabetes.  DIABETIC HISTORY:  Tiffany Estrada was diagnosed with R5JO in 8416, trulicity has been ineffective.  Her hemoglobin A1c has ranged from 7.5% in 2018, peaking at 11.9% in 2020.  On her initial visit to our clinic she had an A1c of 11.2%   , she was on Tresiba, and Metformin. We continued metformin , switched basal insulin to insulin Mix.   SUBJECTIVE:   During the last visit (10/18/2019): A1c 11.2%. We increased  Novolog mix and metformin     Today (06/26/2020): Tiffany Estrada is here for a follow up on diabetes management.  She checks her blood sugars 1 times daily, preprandial to breakfast. The patient has not had hypoglycemic episodes since the last clinic visit.   Has a thigh rash on the thighs that she attributes to Novolog injection , diagnosed with fungal infection     HOME DIABETES REGIMEN:  Metformin 500 mg BID- continues to take 1 tablet daily  Novolog Mix 20 units BID       Statin: yes ACE-I/ARB: Had losartan   METER DOWNLOAD SUMMARY: 2/8-2/22/2021 Average Number Tests/Day = 0.4 Overall Mean FS Glucose = 226 Standard Deviation = 68  BG Ranges: Low = 149 High = 286   Hypoglycemic Events/30 Days: BG < 50 = 0 Episodes of symptomatic severe hypoglycemia = 0     DIABETIC COMPLICATIONS: Microvascular complications:   Neuropathy  Denies: CKD , retinopathy  Last Eye Exam: Completed 08/2019  Macrovascular complications:    Denies: CAD, CVA, PVD   HISTORY:  Past Medical  History:  Past Medical History:  Diagnosis Date  . Diabetes mellitus type 2 in obese ([place]) 02/14/2016  . Essential hypertension 02/04/2016  . History of chicken pox   . Hyperlipidemia   . Neuromuscular disorder ([place])   . Obesity 02/04/2016  . Tobacco abuse 02/14/2016   Past Surgical History:  Past Surgical History:  Procedure Laterality Date  . ABDOMINAL HYSTERECTOMY    . APPENDECTOMY    . CHOLECYSTECTOMY    . COLONOSCOPY  over 10 years ago   in [place]normal exam    Social History:  reports that she has been smoking cigarettes. She has been smoking about 0.25 packs per day. She has never used smokeless tobacco. She reports that she does not drink alcohol and does not use drugs. Family History:  Family History  Problem Relation Age of Onset  . Pancreatitis Mother   . Diabetes Mother   . Pancreatitis Sister   . Pancreatitis Maternal Aunt   . Pancreatitis Maternal Uncle   . Colon cancer Neg Hx   . Colon polyps Neg Hx   . Esophageal cancer Neg Hx   . Rectal cancer Neg Hx   . Stomach cancer Neg Hx      HOME MEDICATIONS: Allergies as of 06/26/2020      Reactions   Latex    Penicillins    Red Dye    Shellfish Allergy    Sulfa Antibiotics       Medication List  Accurate as of June 26, 2020 10:04 AM. If you have any questions, ask your nurse or doctor.        acetaminophen 500 MG tablet Commonly known as: TYLENOL Take 2 tablets (1,000 mg total) by mouth every 6 (six) hours as needed.   Alcohol Swabs Pads Use as directed   amLODipine 10 MG tablet Commonly known as: NORVASC Take 1 tablet (10 mg total) by mouth daily.   amLODipine 5 MG tablet Commonly known as: NORVASC Take 1 tablet by mouth once daily   atorvastatin 40 MG tablet Commonly known as: LIPITOR Take 1 tablet (40 mg total) by mouth daily.   clotrimazole-betamethasone cream Commonly known as: Lotrisone Apply 1 application topically 2 (two) times daily.   cyclobenzaprine 10 MG  tablet Commonly known as: FLEXERIL Take 0.5-1 tablets (5-10 mg total) by mouth 3 (three) times daily as needed for muscle spasms.   FreeStyle Libre 2 Reader Devi 1 Device by Does not apply route as directed.   FreeStyle Libre 2 Sensor Misc 1 Device by Does not apply route as directed.   gabapentin 300 MG capsule Commonly known as: NEURONTIN   losartan 100 MG tablet Commonly known as: COZAAR Take 1 tablet (100 mg total) by mouth daily.   meloxicam 15 MG tablet Commonly known as: MOBIC Take 1 tablet (15 mg total) by mouth daily.   metFORMIN 500 MG tablet Commonly known as: GLUCOPHAGE Take 1 tablet (500 mg total) by mouth 2 (two) times daily with a meal. What changed: when to take this   [place] Take by mouth. Take 1 gummies by mouth daily.   NovoLOG Mix 70/30 FlexPen (70-30) 100 UNIT/ML FlexPen Generic drug: insulin aspart protamine - aspart Inject 0.2 mLs (20 Units total) into the skin 2 (two) times daily with a meal.   OneTouch Delica Lancets Fine Misc Use to check sugars twice weekly.   OneTouch Verio test strip Generic drug: glucose blood Use to check sugars twice weekly.   Pen Needles 32G X 4 MM Misc 1 Device by Does not apply route as directed.   prednisoLONE acetate 1 % ophthalmic suspension Commonly known as: Pred Forte Place 1 drop into the left eye 4 (four) times daily.        OBJECTIVE:   Vital Signs: BP (!) 148/102   Pulse 82   Ht 5\' 6"  (1.676 m)   Wt 180 lb (81.6 kg)   SpO2 98%   BMI 29.05 kg/m   Wt Readings from Last 3 Encounters:  06/26/20 180 lb (81.6 kg)  03/09/20 182 lb (82.6 kg)  02/21/20 176 lb 4 oz (79.9 kg)     Exam: General: Pt appears well and is in NAD  Lungs: Clear with good BS bilat with no rales, rhonchi, or wheezes  Heart: RRR with normal S1 and S2 and no gallops; no murmurs; no rub  Extremities: No pretibial edema on the right but has left foot  boot  Neuro: MS is good with appropriate affect, pt is  alert and Ox3      DATA REVIEWED:  Lab Results  Component Value Date   HGBA1C 9.2 (A) 02/21/2020   HGBA1C 11.2 (H) 10/11/2019   HGBA1C 11.7 (H) 07/08/2019   Lab Results  Component Value Date   MICROALBUR 1.6 02/25/2019   LDLCALC 89 07/08/2019   CREATININE 0.61 07/08/2019   Lab Results  Component Value Date   MICRALBCREAT 2.3 02/25/2019     Lab Results  Component Value  Date   CHOL 157 07/08/2019   HDL 53.60 07/08/2019   LDLCALC 89 07/08/2019   TRIG 76.0 07/08/2019   CHOLHDL 3 07/08/2019         ASSESSMENT / PLAN / RECOMMENDATIONS:   1) Type 2 Diabetes Mellitus, with improving glycemic control, With neuropathic  complications - Most recent A1c of 9.8 %. Goal A1c <7.0 %.     - We again discussed the importance of medication adherence and following a low carb diet as she unfortunately continues with imperfect medication adherence and dietary indiscretions . She also takes her insulin mix without eating, discussed the proper way of taking insulin mix   -I have advised her to increase Metformin to BID multiple times but she continues to take it daily, will keep it for now  - Discussed adding Trulicity as below  - Will refer to our RD, as she believes she has been trying to eat healthy , needs guidance    MEDICATIONS:  Continue Metformin 500 mg daly   Increase Novolog Mix to 24 units BID   Statr Trulicity 2.12 mg weekly   EDUCATION / INSTRUCTIONS:  BG monitoring instructions: Patient is instructed to check her blood sugars 2 times a day, before breakfast and supper .  Call [facility] if: BG persistently < 70 . I reviewed the Rule of 15 for the treatment of hypoglycemia in detail with the patient. Literature supplied.   2) Diabetic complications:   Eye: Does not have known diabetic retinopathy.   Neuro/ Feet: Does have known diabetic peripheral neuropathy .   Renal: Patient does not  have known baseline CKD. She   is not on an ACEI/ARB at  present.     F/U in 4 months    Signed electronically by: Mack Guise, MD  [facility]  [facility] [address] [place], [place] [address] Phone: [phone] FAX: [phone]   CC: Tiffany Estrada, [address] [place] [address] Phone: [phone]  Fax: [phone]  Return to Endocrinology clinic as below: Future Appointments  Date Time Provider [place]  07/17/2020 10:30 AM Nani Ravens, [place]yster, DO [facility] [place]

## 2020-06-26 ENCOUNTER — Other Ambulatory Visit: Payer: Self-pay

## 2020-06-26 ENCOUNTER — Ambulatory Visit: Payer: BC Managed Care – PPO | Admitting: Internal Medicine

## 2020-06-26 ENCOUNTER — Encounter: Payer: Self-pay | Admitting: Internal Medicine

## 2020-06-26 VITALS — BP 148/102 | HR 82 | Ht 66.0 in | Wt 180.0 lb

## 2020-06-26 DIAGNOSIS — E1165 Type 2 diabetes mellitus with hyperglycemia: Secondary | ICD-10-CM

## 2020-06-26 DIAGNOSIS — Z794 Long term (current) use of insulin: Secondary | ICD-10-CM | POA: Diagnosis not present

## 2020-06-26 LAB — POCT GLYCOSYLATED HEMOGLOBIN (HGB A1C): Hemoglobin A1C: 9.8 % — AB (ref 4.0–5.6)

## 2020-06-26 MED ORDER — METFORMIN HCL 500 MG PO TABS
500.0000 mg | ORAL_TABLET | Freq: Every day | ORAL | 3 refills | Status: DC
Start: 2020-06-26 — End: 2020-10-30

## 2020-06-26 MED ORDER — NOVOLOG MIX 70/30 FLEXPEN (70-30) 100 UNIT/ML ~~LOC~~ SUPN: 24.0000 [IU] | PEN_INJECTOR | Freq: Two times a day (BID) | SUBCUTANEOUS | 3 refills | Status: DC

## 2020-06-26 MED ORDER — TRULICITY 0.75 MG/0.5ML ~~LOC~~ SOAJ: 0.7500 mg | SUBCUTANEOUS | 6 refills | Status: DC

## 2020-06-26 NOTE — Patient Instructions (Signed)
-   Start trulicity 8.41 mg weekly  -Increase  Novolog Mix 24 units with Breakfast and 24 units with Supper - Continue Metformin 500 mg, 1 tablet daily       -HOW TO TREAT LOW BLOOD SUGARS (Blood sugar LESS THAN 70 MG/DL)  Please follow the RULE OF 15 for the treatment of hypoglycemia treatment (when your (blood sugars are less than 70 mg/dL)    STEP 1: Take 15 grams of carbohydrates when your blood sugar is low, which includes:   3-4 GLUCOSE TABS  OR  3-4 OZ OF JUICE OR REGULAR SODA OR  ONE TUBE OF GLUCOSE GEL     STEP 2: RECHECK blood sugar in 15 MINUTES STEP 3: If your blood sugar is still low at the 15 minute recheck --> then, go back to STEP 1 and treat AGAIN with another 15 grams of carbohydrates.

## 2020-07-03 ENCOUNTER — Telehealth: Payer: Self-pay | Admitting: Internal Medicine

## 2020-07-03 NOTE — Telephone Encounter (Signed)
Pt called because she is about to pick up her trulicity refill from the pharmacy but was wanting to see if it was ok if she uses a box she got last year from [facility] that is unopened and unused. Pt states it is the same medication and says it writes "1.5/0.5 mL" on the box and doesn't expire till December 2022. She would like the nurse to give her a call to see if it would be ok to use before using her new refill.   Pt callback # [phone] pt states nurse can leave a VM with what she can do if she doesn't pick up.

## 2020-07-03 NOTE — Telephone Encounter (Signed)
I have called patient and inform her that Dr Kelton Pillar have change her dosage on Trulicity, she will need to pick the new Rx.  Patient verbalized understanding.

## 2020-07-06 ENCOUNTER — Telehealth: Payer: Self-pay | Admitting: Internal Medicine

## 2020-07-06 NOTE — Telephone Encounter (Signed)
I do not excuse people from work for diabetes, she has had this for a long time and if anything its getting better.

## 2020-07-06 NOTE — Telephone Encounter (Signed)
Spoken to patient and notified Dr Shamleffer's comments. Verbalized understanding.   

## 2020-07-06 NOTE — Telephone Encounter (Addendum)
I have called patient and inform her that we cannot write excusing from work without being seen. Patient stated that well she was seen on 06/26/2020 and have not gone to work since then.   Patient proceed to inform me that she saw ortho and I suggested to get a work from them. She stated well her diabetes is bad so Dr Kelton Pillar should do it.  I given suggested FMLA from ortho/pcp  Please advise.

## 2020-07-06 NOTE — Telephone Encounter (Signed)
Pt called to ask if she could get a note to write her out of work until her next appt with Dr. Kelton Pillar

## 2020-07-17 ENCOUNTER — Other Ambulatory Visit: Payer: Self-pay

## 2020-07-17 ENCOUNTER — Encounter: Payer: Self-pay | Admitting: Family Medicine

## 2020-07-17 ENCOUNTER — Ambulatory Visit (INDEPENDENT_AMBULATORY_CARE_PROVIDER_SITE_OTHER): Payer: BC Managed Care – PPO | Admitting: Family Medicine

## 2020-07-17 VITALS — BP 122/78 | HR 86 | Temp 97.5°F | Resp 17 | Ht 66.0 in | Wt 182.0 lb

## 2020-07-17 DIAGNOSIS — Z0000 Encounter for general adult medical examination without abnormal findings: Secondary | ICD-10-CM | POA: Diagnosis not present

## 2020-07-17 DIAGNOSIS — Z1331 Encounter for screening for depression: Secondary | ICD-10-CM | POA: Diagnosis not present

## 2020-07-17 LAB — COMPREHENSIVE METABOLIC PANEL
ALT: 25 U/L (ref 0–35)
AST: 18 U/L (ref 0–37)
Albumin: 4 g/dL (ref 3.5–5.2)
Alkaline Phosphatase: 120 U/L — ABNORMAL HIGH (ref 39–117)
BUN: 11 mg/dL (ref 6–23)
CO2: 24 mEq/L (ref 19–32)
Calcium: 9.5 mg/dL (ref 8.4–10.5)
Chloride: 104 mEq/L (ref 96–112)
Creatinine, Ser: 0.58 mg/dL (ref 0.40–1.20)
GFR: 97.11 mL/min (ref >60.00)
Glucose, Bld: 187 mg/dL — ABNORMAL HIGH (ref 70–99)
Potassium: 3.6 mEq/L (ref 3.5–5.1)
Sodium: 137 mEq/L (ref 135–145)
Total Bilirubin: 0.5 mg/dL (ref 0.2–1.2)
Total Protein: 7 g/dL (ref 6.0–8.3)

## 2020-07-17 LAB — LIPID PANEL
Cholesterol: 109 mg/dL (ref 0–200)
HDL: 38.4 mg/dL — ABNORMAL LOW (ref >39.00)
LDL Cholesterol: 53 mg/dL (ref 0–99)
NonHDL: 70.77
Total CHOL/HDL Ratio: 3
Triglycerides: 91 mg/dL (ref 0.0–149.0)
VLDL: 18.2 mg/dL (ref 0.0–40.0)

## 2020-07-17 NOTE — Patient Instructions (Addendum)
Give [place] 2-3 business days to get the results of your labs back.   Keep the diet clean and stay active.  The new Shingrix vaccine (for shingles) is a 2 shot series. It can make people feel low energy, achy and almost like they have the flu for 48 hours after injection. Please plan accordingly when deciding on when to get this shot. Call our office for a nurse visit appointment to get this. The second shot of the series is less severe regarding the side effects, but it still lasts 48 hours.   For the swelling in your lower extremities, be sure to elevate your legs when able, mind the salt intake, stay physically active and consider wearing compression stockings.  Let us know if you need anything.

## 2020-07-17 NOTE — Progress Notes (Signed)
Chief Complaint  Patient presents with  . Annual Exam     Well Woman Tiffany Estrada is here for a complete physical.   Her last physical was >1 year ago.  Current diet: in general, a "healthy" diet. Current exercise: walking. Weight is stable and she denies fatigue out of ordinary. Seatbelt? Yes Loss of interested in doing things or depression in past 2 weeks? No  Health Maintenance Mammogram- Yes Colon cancer screening-Yes Shingrix- No Tetanus- Yes Hep C screening- Yes HIV screening- Yes  Past Medical History:  Diagnosis Date  . Diabetes mellitus type 2 in obese ([place]) 02/14/2016  . Essential hypertension 02/04/2016  . History of chicken pox   . Hyperlipidemia   . Neuromuscular disorder ([place])   . Obesity 02/04/2016  . Tobacco abuse 02/14/2016     Past Surgical History:  Procedure Laterality Date  . ABDOMINAL HYSTERECTOMY     Fibroids  . APPENDECTOMY    . CHOLECYSTECTOMY    . COLONOSCOPY  over 10 years ago   in [place]-normal exam   Medications  Current Outpatient Medications on File Prior to Visit  Medication Sig Dispense Refill  . acetaminophen (TYLENOL) 500 MG tablet Take 2 tablets (1,000 mg total) by mouth every 6 (six) hours as needed. 30 tablet 0  . Alcohol Swabs PADS Use as directed 100 each 1  . amLODipine (NORVASC) 10 MG tablet Take 1 tablet (10 mg total) by mouth daily. 30 tablet 11  . atorvastatin (LIPITOR) 40 MG tablet Take 1 tablet (40 mg total) by mouth daily. 90 tablet 1  . clotrimazole-betamethasone (LOTRISONE) cream Apply 1 application topically 2 (two) times daily. 30 g 0  . cyclobenzaprine (FLEXERIL) 10 MG tablet Take 0.5-1 tablets (5-10 mg total) by mouth 3 (three) times daily as needed for muscle spasms. 21 tablet 0  . Dulaglutide (TRULICITY) 7.82 NF/6.2ZH SOPN Inject 0.75 mg into the skin once a week. 2 mL 6  . gabapentin (NEURONTIN) 300 MG capsule     . glucose blood (ONETOUCH VERIO) test strip Use to check sugars twice weekly. 200 each  3  . insulin aspart protamine - aspart (NOVOLOG MIX 70/30 FLEXPEN) (70-30) 100 UNIT/ML FlexPen Inject 0.24 mLs (24 Units total) into the skin 2 (two) times daily with a meal. 45 mL 3  . Insulin Pen Needle (PEN NEEDLES) 32G X 4 MM MISC 1 Device by Does not apply route as directed. 100 each 6  . losartan (COZAAR) 100 MG tablet Take 1 tablet (100 mg total) by mouth daily. 30 tablet 11  . meloxicam (MOBIC) 15 MG tablet Take 1 tablet (15 mg total) by mouth daily. 30 tablet 0  . metFORMIN (GLUCOPHAGE) 500 MG tablet Take 1 tablet (500 mg total) by mouth daily with breakfast. 90 tablet 3  . Multiple Vitamins-Minerals (MULTI ADULT GUMMIES PO) Take by mouth. Take 1 gummies by mouth daily.    [name] DELICA LANCETS FINE MISC Use to check sugars twice weekly. 200 each 2  . prednisoLONE acetate (PRED FORTE) 1 % ophthalmic suspension Place 1 drop into the left eye 4 (four) times daily. 5 mL 0   Allergies Allergies  Allergen Reactions  . Latex   . Penicillins   . Red Dye   . Shellfish Allergy   . Sulfa Antibiotics     Review of Systems: Constitutional:  no unexpected weight changes Eye:  no recent significant change in vision Ear/Nose/Mouth/Throat:  Ears:  no recent change in hearing Nose/Mouth/Throat:  no complaints  of nasal congestion, no sore throat Cardiovascular: no chest pain, +intermittent LE swelling Respiratory:  no shortness of breath Gastrointestinal:  no abdominal pain, no change in bowel habits GU:  Female: negative for dysuria or pelvic pain Musculoskeletal/Extremities:  +Back pain and wrist pain Integumentary (Skin/Breast):  No skin lesions Neurologic:  no headaches Endocrine:  denies fatigue  Exam BP 122/78 (BP Location: Left Arm, Patient Position: Sitting, Cuff Size: Normal)   Pulse 86   Temp (!) 97.5 F (36.4 C)   Resp 17   Ht 5\' 6"  (1.676 m)   Wt 182 lb (82.6 kg)   SpO2 98%   BMI 29.38 kg/m  General:  well developed, well nourished, in no apparent distress Skin:   no significant moles, warts, or growths Head:  no masses, lesions, or tenderness Eyes:  pupils equal and round, sclera anicteric without injection Ears:  canals without lesions, TMs shiny without retraction, no obvious effusion, no erythema Nose:  nares patent, septum midline, mucosa normal, and no drainage or sinus tenderness Throat/Pharynx:  lips and gingiva without lesion; tongue and uvula midline; non-inflamed pharynx; no exudates or postnasal drainage Neck: neck supple without adenopathy, thyromegaly, or masses Lungs:  clear to auscultation, breath sounds equal bilaterally, no respiratory distress Cardio:  regular rate and rhythm, no LE edema Abdomen:  abdomen soft, nontender; bowel sounds normal; no masses or organomegaly Genital: Defer to GYN Musculoskeletal:  symmetrical muscle groups noted without atrophy or deformity Extremities:  no clubbing, cyanosis, or edema, no deformities, no skin discoloration Neuro:  gait normal; deep tendon reflexes normal and symmetric Psych: well oriented with normal range of affect and appropriate judgment/insight  Assessment and Plan  Well adult exam - Plan: Lipid panel, Comprehensive metabolic panel  Depression screening negative   Well 65 y.o. female. Counseled on diet and exercise. Other orders as above. Shingrix rec'd.  Follow up in 6 mo or prn. The patient voiced understanding and agreement to the plan.  [place], DO 07/17/20 11:07 AM

## 2020-07-23 ENCOUNTER — Other Ambulatory Visit: Payer: Self-pay | Admitting: Family Medicine

## 2020-07-23 ENCOUNTER — Telehealth: Payer: Self-pay | Admitting: Family Medicine

## 2020-07-23 DIAGNOSIS — R748 Abnormal levels of other serum enzymes: Secondary | ICD-10-CM

## 2020-07-23 NOTE — Telephone Encounter (Signed)
Patient would like a call back to go over lab results   Please advise

## 2020-07-23 NOTE — Telephone Encounter (Signed)
Patient informed of results See result notes

## 2020-07-26 ENCOUNTER — Other Ambulatory Visit: Payer: Self-pay

## 2020-07-26 ENCOUNTER — Other Ambulatory Visit (INDEPENDENT_AMBULATORY_CARE_PROVIDER_SITE_OTHER): Payer: BC Managed Care – PPO

## 2020-07-26 DIAGNOSIS — R748 Abnormal levels of other serum enzymes: Secondary | ICD-10-CM | POA: Diagnosis not present

## 2020-07-30 LAB — ALKALINE PHOSPHATASE, ISOENZYMES
Alkaline Phosphatase: 115 IU/L (ref 44–121)
BONE FRACTION: 28 % (ref 14–68)
INTESTINAL FRAC.: 26 % — ABNORMAL HIGH (ref 0–18)
LIVER FRACTION: 46 % (ref 18–85)

## 2020-07-30 LAB — SPECIMEN STATUS REPORT

## 2020-08-15 ENCOUNTER — Telehealth: Payer: Self-pay | Admitting: Family Medicine

## 2020-08-15 ENCOUNTER — Other Ambulatory Visit: Payer: Self-pay | Admitting: Family Medicine

## 2020-08-15 DIAGNOSIS — I10 Essential (primary) hypertension: Secondary | ICD-10-CM

## 2020-08-15 NOTE — Telephone Encounter (Signed)
Yes

## 2020-08-15 NOTE — Telephone Encounter (Signed)
Patient informed of instructions

## 2020-08-15 NOTE — Telephone Encounter (Signed)
See below

## 2020-08-15 NOTE — Telephone Encounter (Signed)
Caller Rhanda Lemire  Call Back # [phone]  Patient would like to know if she should continue taking amlodipine 5MG .  Please advise

## 2020-08-15 NOTE — Telephone Encounter (Signed)
Sent to wrong provider sorry

## 2020-08-15 NOTE — Telephone Encounter (Signed)
Dr Nani Ravens patient

## 2020-08-16 ENCOUNTER — Encounter: Payer: Self-pay | Admitting: Dietician

## 2020-08-16 ENCOUNTER — Other Ambulatory Visit: Payer: Self-pay

## 2020-08-16 ENCOUNTER — Encounter: Payer: BC Managed Care – PPO | Attending: Internal Medicine | Admitting: Dietician

## 2020-08-16 DIAGNOSIS — E669 Obesity, unspecified: Secondary | ICD-10-CM | POA: Insufficient documentation

## 2020-08-16 DIAGNOSIS — E1169 Type 2 diabetes mellitus with other specified complication: Secondary | ICD-10-CM | POA: Insufficient documentation

## 2020-08-16 NOTE — Patient Instructions (Addendum)
Aim to exercise for 30 minutes 5 times per week. Take you Novolog 70/30 before breakfast and before dinner Rethink what you drink.  Drink more water.  Replace sweetened drinks and juice with water or unsweetened beverages. Bake rather than fry most of the time.

## 2020-08-16 NOTE — Progress Notes (Signed)
Diabetes Self-Management Education  Visit Type: First/Initial  Appt. Start Time: 1335 Appt. End Time: 1440 Patient was late  08/17/2020  [name], identified by name and date of birth, is a 65 y.o. female with a diagnosis of Diabetes: Type 2.   Patient is here today alone.  She would like to learn what she can and cannot eat.  She states that she would like to get off of her medications. States that she has known people with amputations.  History includes Type 2 Diabetes (2017) but states that she did not feel it and thinks that she was diagnosed 05/2020 after she broke a wrist,  HTN, Dyslipidemia A1C:  9.8% 06/26/2020 increased from 9.2% 10/10/2020 CGM:  FreeStyle Libre Medications include Trulicity, Metformin once daily, Novolog mix (70/30 mix) 24 units (taking after breakfast and after dinner).  Uses a nicotine patch and smokes when very stressed (when not wearing patch). Feeling nauseous recently. Allergy to strawberries and shellfish.  Patient lives with her husband.  She does the shopping and cooking. She currently is not working.  She previously drove a school bus.  ASSESSMENT  Height 5\' 6"  (1.676 m), weight 181 lb (82.1 kg). Body mass index is 29.21 kg/m.   Diabetes Self-Management Education - 08/16/20 1350      Visit Information   Visit Type First/Initial      Initial Visit   Diabetes Type Type 2    Are you currently following a meal plan? No    Are you taking your medications as prescribed? Yes    Date Diagnosed 2017      Health Coping   How would you rate your overall health? Fair      Psychosocial Assessment   Patient Belief/Attitude about Diabetes Afraid    Self-care barriers None    Self-management support Doctor's office    Other persons present Patient    Patient Concerns Nutrition/Meal planning;Glycemic Control    Special Needs None    Preferred Learning Style No preference indicated    Learning Readiness Ready    How often do you need to  have someone help you when you read instructions, pamphlets, or other written materials from your doctor or pharmacy? 1 - Never    What is the last grade level you completed in school? 3 years college      Pre-Education Assessment   Patient understands the diabetes disease and treatment process. Needs Instruction    Patient understands incorporating nutritional management into lifestyle. Needs Instruction    Patient undertands incorporating physical activity into lifestyle. Needs Instruction    Patient understands using medications safely. Needs Instruction    Patient understands monitoring blood glucose, interpreting and using results Needs Instruction    Patient understands prevention, detection, and treatment of acute complications. Needs Instruction    Patient understands prevention, detection, and treatment of chronic complications. Needs Instruction    Patient understands how to develop strategies to address psychosocial issues. Needs Instruction    Patient understands how to develop strategies to promote health/change behavior. Needs Instruction      Complications   Last HgB A1C per patient/outside source 9.8 %   06/26/20 increased from 9.2% 10/10/2020   How often do you check your blood sugar? 3-4 times / week   twice a week   Fasting Blood glucose range (mg/dL) >200   285 08/13/2020   Number of hypoglycemic episodes per month 0    Number of hyperglycemic episodes per week --   multiple  Have you had a dilated eye exam in the past 12 months? Yes    Have you had a dental exam in the past 12 months? No    Are you checking your feet? Yes    How many days per week are you checking your feet? 7      Dietary Intake   Breakfast eggs, cheese, 2 strips bacon, waffle and syrup or white toast with jelly, occasional juice, fruit   10   Lunch skips due to breakfast being late    Snack (afternoon) orange or occasiona [employer] Kid[employer] meal    Dinner canned soup and sandwich OR chinese mixed  vegetables, white rice, chicken    Snack (evening) handful of corn chips or grapes    Beverage(s) water, coffee with sweetened cream and 1 tsp sugar (black on weekends), sweet tean (4 tsp per 8 oz), lemonade (3 tsp sugar per 8 oz or less added sugar when mixed with juice), juice, occasional regular sprite      Exercise   Exercise Type Light (walking / raking leaves)   stretching and walking, sit and peddle   How many days per week to you exercise? 3    How many minutes per day do you exercise? 15    Total minutes per week of exercise 45      Patient Education   Previous Diabetes Education No    Disease state  Definition of diabetes, type 1 and 2, and the diagnosis of diabetes    Nutrition management  Role of diet in the treatment of diabetes and the relationship between the three main macronutrients and blood glucose level;Food label reading, portion sizes and measuring food.;Meal options for control of blood glucose level and chronic complications.;Information on hints to eating out and maintain blood glucose control.;Meal timing in regards to the patients' current diabetes medication.    Physical activity and exercise  Role of exercise on diabetes management, blood pressure control and cardiac health.    Medications Reviewed patients medication for diabetes, action, purpose, timing of dose and side effects.    Monitoring Yearly dilated eye exam;Daily foot exams;Identified appropriate SMBG and/or A1C goals.;Taught/discussed recording of test results and interpretation of SMBG.    Acute complications Taught treatment of hypoglycemia - the 15 rule.    Chronic complications Relationship between chronic complications and blood glucose control    Psychosocial adjustment Role of stress on diabetes    Personal strategies to promote health Lifestyle issues that need to be addressed for better diabetes care      Individualized Goals (developed by patient)   Nutrition General guidelines for healthy  choices and portions discussed    Physical Activity Exercise 5-7 days per week;30 minutes per day    Medications take my medication as prescribed    Monitoring  test my blood glucose as discussed    Reducing Risk examine blood glucose patterns;stop smoking;increase portions of healthy fats      Post-Education Assessment   Patient understands the diabetes disease and treatment process. Demonstrates understanding / competency    Patient understands incorporating nutritional management into lifestyle. Needs Review    Patient undertands incorporating physical activity into lifestyle. Demonstrates understanding / competency    Patient understands using medications safely. Demonstrates understanding / competency    Patient understands monitoring blood glucose, interpreting and using results Demonstrates understanding / competency    Patient understands prevention, detection, and treatment of acute complications. Demonstrates understanding / competency    Patient understands prevention, detection,  and treatment of chronic complications. Demonstrates understanding / competency    Patient understands how to develop strategies to address psychosocial issues. Demonstrates understanding / competency    Patient understands how to develop strategies to promote health/change behavior. Needs Review      Outcomes   Expected Outcomes Demonstrated interest in learning. Expect positive outcomes    Future DMSE 2 months    Program Status Not Completed           Individualized Plan for Diabetes Self-Management Training:   Learning Objective:  Patient will have a greater understanding of diabetes self-management. Patient education plan is to attend individual and/or group sessions per assessed needs and concerns.   Plan:   Patient Instructions  Aim to exercise for 30 minutes 5 times per week. Take you Novolog 70/30 before breakfast and before dinner Rethink what you drink.  Drink more water.  Replace  sweetened drinks and juice with water or unsweetened beverages. Bake rather than fry most of the time.   Expected Outcomes:  Demonstrated interest in learning. Expect positive outcomes  Education material provided: ADA - How to Thrive: A Guide for Your Journey with Diabetes, Food label handouts, Meal plan card, My Plate and Diabetes Resources, Dining out with Diabetes, Types of Insulin,  If problems or questions, patient to contact team via:  Phone  Future DSME appointment: 2 months

## 2020-09-03 ENCOUNTER — Other Ambulatory Visit: Payer: Self-pay | Admitting: Family Medicine

## 2020-09-03 DIAGNOSIS — E669 Obesity, unspecified: Secondary | ICD-10-CM

## 2020-09-03 DIAGNOSIS — E1169 Type 2 diabetes mellitus with other specified complication: Secondary | ICD-10-CM

## 2020-09-17 NOTE — Progress Notes (Signed)
Name: Tiffany Estrada  Age/ Sex: 65 y.o., female   MRN/ DOB: [id number], [birth year]     PCP: Tiffany Pal, DO   Reason for Endocrinology Evaluation: Type 2 Diabetes Mellitus  Initial Endocrine Consultative Visit: 10/18/2019    PATIENT IDENTIFIER: [name] is a 65 y.o. female with a past medical history of T2DM, HTN and Dyslipidemia. The patient has followed with Endocrinology clinic since 10/18/2019 for consultative assistance with management of her diabetes.  DIABETIC HISTORY:  Tiffany Estrada was diagnosed with F6OZ in 3086, trulicity has been ineffective.  Her hemoglobin A1c has ranged from 7.5% in 2018, peaking at 11.9% in 2020.  On her initial visit to our clinic she had an A1c of 11.2%   , she was on Tresiba, and Metformin. We continued metformin , switched basal insulin to insulin Mix.   Trulicity started 09/7844 SUBJECTIVE:   During the last visit (06/26/2020): A1c 9.8 %. We increased  Novolog mix, continued metformin and started Trulicity    Today (9/62/9528): Tiffany Estrada is here for a follow up on diabetes management.  She checks her blood sugars 1 times daily, preprandial to breakfast. The patient has not had hypoglycemic episodes since the last clinic visit.   Denies diarrhea  Has occasional nausea   HOME DIABETES REGIMEN:  Metformin 500 mg daily Novolog Mix 24 units BID - takes 20 units twice a day  Trulicity 4.13 mg weekly ( Wednesday)        Statin: yes ACE-I/ARB: Had losartan     METER DOWNLOAD SUMMARY: Did not bring    DIABETIC COMPLICATIONS: Microvascular complications:  Neuropathy Denies: CKD , retinopathy Last Eye Exam: Completed 08/2019  Macrovascular complications:   Denies: CAD, CVA, PVD   HISTORY:  Past Medical History:  Past Medical History:  Diagnosis Date  . Diabetes mellitus type 2 in obese ([place]) 02/14/2016  . Essential hypertension 02/04/2016  . History of chicken pox   . Hyperlipidemia   . Neuromuscular  disorder ([place])   . Obesity 02/04/2016  . Tobacco abuse 02/14/2016   Past Surgical History:  Past Surgical History:  Procedure Laterality Date  . ABDOMINAL HYSTERECTOMY     Fibroids  . APPENDECTOMY    . CHOLECYSTECTOMY    . COLONOSCOPY  over 10 years ago   in [place]-normal exam   Social History:  reports that she has been smoking cigarettes. She has been smoking about 0.25 packs per day. She has never used smokeless tobacco. She reports that she does not drink alcohol and does not use drugs. Family History:  Family History  Problem Relation Age of Onset  . Pancreatitis Mother   . Diabetes Mother   . Pancreatitis Sister   . Pancreatitis Maternal Aunt   . Pancreatitis Maternal Uncle   . Colon cancer Neg Hx   . Colon polyps Neg Hx   . Esophageal cancer Neg Hx   . Rectal cancer Neg Hx   . Stomach cancer Neg Hx      HOME MEDICATIONS: Allergies as of 09/18/2020      Reactions   Latex    Penicillins    Red Dye    Shellfish Allergy    Sulfa Antibiotics       Medication List       Accurate as of Sep 18, 2020 12:35 PM. If you have any questions, ask your nurse or doctor.        STOP taking these medications   meloxicam 15 MG tablet  Commonly known as: MOBIC Stopped by: Dorita Sciara, MD   Trulicity 6.20 BT/5.9RC Sopn Generic drug: Dulaglutide Replaced by: Trulicity 1.5 BU/3.8GT Sopn Stopped by: Dorita Sciara, MD     TAKE these medications   acetaminophen 500 MG tablet Commonly known as: TYLENOL Take 2 tablets (1,000 mg total) by mouth every 6 (six) hours as needed.   Alcohol Swabs Pads Use as directed   amLODipine 10 MG tablet Commonly known as: NORVASC Take 1 tablet (10 mg total) by mouth daily.   amLODipine 5 MG tablet Commonly known as: NORVASC Take 1 tablet by mouth once daily   atorvastatin 40 MG tablet Commonly known as: LIPITOR Take 1 tablet (40 mg total) by mouth daily.   cholecalciferol 25 MCG (1000 UNIT) tablet Commonly  known as: VITAMIN D3 Take 1,000 Units by mouth daily.   clotrimazole-betamethasone cream Commonly known as: Lotrisone Apply 1 application topically 2 (two) times daily.   cyclobenzaprine 10 MG tablet Commonly known as: FLEXERIL Take 0.5-1 tablets (5-10 mg total) by mouth 3 (three) times daily as needed for muscle spasms.   gabapentin 300 MG capsule Commonly known as: NEURONTIN   losartan 100 MG tablet Commonly known as: COZAAR Take 1 tablet (100 mg total) by mouth daily.   metFORMIN 500 MG tablet Commonly known as: GLUCOPHAGE Take 1 tablet (500 mg total) by mouth daily with breakfast.   MULTI ADULT GUMMIES PO Take by mouth. Take 1 gummies by mouth daily.   NovoLOG Mix 70/30 FlexPen (70-30) 100 UNIT/ML FlexPen Generic drug: insulin aspart protamine - aspart Inject 0.24 mLs (24 Units total) into the skin 2 (two) times daily with a meal.   Omega-3 1000 MG Caps Take by mouth.   OneTouch Delica Lancets Fine Misc Use to check sugars twice weekly.   OneTouch Verio test strip Generic drug: glucose blood USE 1 STRIP TO CHECK GLUCOSE TWICE A WEEK   Pen Needles 32G X 4 MM Misc 1 Device by Does not apply route as directed.   prednisoLONE acetate 1 % ophthalmic suspension Commonly known as: Pred Forte Place 1 drop into the left eye 4 (four) times daily.   Trulicity 1.5 XM/4.6OE Sopn Generic drug: Dulaglutide Inject 1.5 mg into the skin once a week. Replaces: Trulicity 3.21 YY/4.8GN Sopn Started by: Dorita Sciara, MD        OBJECTIVE:   Vital Signs: BP 130/76   Pulse 80   Ht 5\' 6"  (1.676 m)   Wt 177 lb 6 oz (80.5 kg)   SpO2 98%   BMI 28.63 kg/m   Wt Readings from Last 3 Encounters:  09/18/20 177 lb 6 oz (80.5 kg)  08/16/20 181 lb (82.1 kg)  07/17/20 182 lb (82.6 kg)     Exam: General: Pt appears well and is in NAD  Lungs: Clear with good BS bilat with no rales, rhonchi, or wheezes  Heart: RRR with normal S1 and S2 and no gallops; no murmurs; no rub   Extremities: No pretibial edema on the right but has left foot  boot  Neuro: MS is good with appropriate affect, pt is alert and Ox3      DATA REVIEWED:  Lab Results  Component Value Date   HGBA1C 8.2 (A) 09/18/2020   HGBA1C 9.8 (A) 06/26/2020   HGBA1C 9.2 (A) 02/21/2020   Lab Results  Component Value Date   MICROALBUR 1.6 02/25/2019   LDLCALC 53 07/17/2020   CREATININE 0.58 07/17/2020   Lab Results  Component Value  Date   MICRALBCREAT 2.3 02/25/2019     Lab Results  Component Value Date   CHOL 109 07/17/2020   HDL 38.40 (L) 07/17/2020   LDLCALC 53 07/17/2020   TRIG 91.0 07/17/2020   CHOLHDL 3 07/17/2020         ASSESSMENT / PLAN / RECOMMENDATIONS:   1) Type 2 Diabetes Mellitus, with improving glycemic control, With neuropathic  complications - Most recent A1c of 8.2 %. Goal A1c <7.0 %.    - A1c down from 9.8%  - She has been using less insulin then prescribed, she tells me ,she feels at best with BG's in 250 mg/dL Discussed normal BG's 70-100 mg/dL and if she keeps her sugars that high, she will be at risk for complications. - She also alternates taking Trulicity, at times its on sundays and other weeks its on Wednesday. Advised pt to take it same day of every week   - She agreed to increasing Trulicity   MEDICATIONS: Continue Metformin 500 mg daly  Continue Novolog Mix 20  units BID  Increase Trulicity 1.5  mg weekly   EDUCATION / INSTRUCTIONS: BG monitoring instructions: Patient is instructed to check her blood sugars 2 times a day, before breakfast and supper . Call [facility] if: BG persistently < 70 I reviewed the Rule of 15 for the treatment of hypoglycemia in detail with the patient. Literature supplied.   2) Diabetic complications:  Eye: Does not have known diabetic retinopathy.  Neuro/ Feet: Does have known diabetic peripheral neuropathy .  Renal: Patient does not  have known baseline CKD. She   is not on an ACEI/ARB at  present.     F/U in 4 months    Signed electronically by: Mack Guise, MD  [facility]  [facility] [address] [place], [place] [address] Phone: [phone] FAX: [phone]   CC: Tiffany Estrada, [address] [place] [address] Phone: [phone]  Fax: [phone]  Return to Endocrinology clinic as below: Future Appointments  Date Time Provider [place]  10/18/2020  1:00 PM Clydell Hakim, RD [place] [facility]  10/24/2020 10:30 AM Tiffany Pal, DO [facility]  01/18/2021 10:30 AM Tiffany Pal, DO [facility]  01/22/2021 10:30 AM Kaleeah Gingerich, Melanie Crazier, MD [facility]

## 2020-09-18 ENCOUNTER — Encounter: Payer: Self-pay | Admitting: Internal Medicine

## 2020-09-18 ENCOUNTER — Ambulatory Visit (INDEPENDENT_AMBULATORY_CARE_PROVIDER_SITE_OTHER): Payer: BC Managed Care – PPO | Admitting: Internal Medicine

## 2020-09-18 ENCOUNTER — Other Ambulatory Visit: Payer: Self-pay

## 2020-09-18 VITALS — BP 130/76 | HR 80 | Ht 66.0 in | Wt 177.4 lb

## 2020-09-18 DIAGNOSIS — E1165 Type 2 diabetes mellitus with hyperglycemia: Secondary | ICD-10-CM

## 2020-09-18 DIAGNOSIS — E1142 Type 2 diabetes mellitus with diabetic polyneuropathy: Secondary | ICD-10-CM | POA: Diagnosis not present

## 2020-09-18 DIAGNOSIS — Z794 Long term (current) use of insulin: Secondary | ICD-10-CM

## 2020-09-18 LAB — POCT GLUCOSE (DEVICE FOR HOME USE): POC Glucose: 220 mg/dl — AB (ref 70–99)

## 2020-09-18 LAB — POCT GLYCOSYLATED HEMOGLOBIN (HGB A1C): Hemoglobin A1C: 8.2 % — AB (ref 4.0–5.6)

## 2020-09-18 MED ORDER — TRULICITY 1.5 MG/0.5ML ~~LOC~~ SOAJ: 1.5000 mg | SUBCUTANEOUS | 3 refills | Status: DC

## 2020-09-18 NOTE — Patient Instructions (Addendum)
-   Increase trulicity 1.5  mg weekly  - Continue Novolog Mix 20 units with Breakfast and 20 units with Supper - Continue Metformin 500 mg, 1 tablet daily       -HOW TO TREAT LOW BLOOD SUGARS (Blood sugar LESS THAN 70 MG/DL)  Please follow the RULE OF 15 for the treatment of hypoglycemia treatment (when your (blood sugars are less than 70 mg/dL)    STEP 1: Take 15 grams of carbohydrates when your blood sugar is low, which includes:   3-4 GLUCOSE TABS  OR  3-4 OZ OF JUICE OR REGULAR SODA OR  ONE TUBE OF GLUCOSE GEL     STEP 2: RECHECK blood sugar in 15 MINUTES STEP 3: If your blood sugar is still low at the 15 minute recheck --> then, go back to STEP 1 and treat AGAIN with another 15 grams of carbohydrates.

## 2020-10-05 ENCOUNTER — Ambulatory Visit: Payer: BC Managed Care – PPO | Admitting: Family Medicine

## 2020-10-05 ENCOUNTER — Other Ambulatory Visit: Payer: Self-pay

## 2020-10-05 ENCOUNTER — Encounter: Payer: Self-pay | Admitting: Family Medicine

## 2020-10-05 VITALS — BP 140/78 | HR 94 | Temp 98.2°F | Ht 66.0 in | Wt 177.2 lb

## 2020-10-05 DIAGNOSIS — H6983 Other specified disorders of Eustachian tube, bilateral: Secondary | ICD-10-CM | POA: Diagnosis not present

## 2020-10-05 DIAGNOSIS — Z01818 Encounter for other preprocedural examination: Secondary | ICD-10-CM | POA: Diagnosis not present

## 2020-10-05 DIAGNOSIS — M542 Cervicalgia: Secondary | ICD-10-CM | POA: Diagnosis not present

## 2020-10-05 LAB — CBC
HCT: 40.8 % (ref 36.0–46.0)
Hemoglobin: 13.9 g/dL (ref 12.0–15.0)
MCHC: 34.1 g/dL (ref 30.0–36.0)
MCV: 86.5 fl (ref 78.0–100.0)
Platelets: 336 10*3/uL (ref 150.0–400.0)
RBC: 4.72 Mil/uL (ref 3.87–5.11)
RDW: 13.3 % (ref 11.5–15.5)
WBC: 8.7 10*3/uL (ref 4.0–10.5)

## 2020-10-05 MED ORDER — FLUTICASONE PROPIONATE 50 MCG/ACT NA SUSP: 2.0000 | Freq: Every day | NASAL | 2 refills | Status: DC

## 2020-10-05 MED ORDER — METHOCARBAMOL 500 MG PO TABS: 500.0000 mg | ORAL_TABLET | Freq: Three times a day (TID) | ORAL | 0 refills | Status: DC | PRN

## 2020-10-05 NOTE — Patient Instructions (Addendum)
Give [place] 2-3 business days to get the results of your labs back.   There should be no contraindications to surgery.  Stay hydrated.  Flonase (fluticasone); nasal spray that is over the counter. 2 sprays each nostril, once daily. Aim towards the same side eye when you spray.  There are available OTC, and the generic versions, which may be cheaper, are in parentheses. Show this to a pharmacist if you have trouble finding any of these items.  OK to take Tylenol 1000 mg (2 extra strength tabs) or 975 mg (3 regular strength tabs) every 6 hours as needed.  Ibuprofen 400-600 mg (2-3 over the counter strength tabs) every 6 hours as needed for pain. Stop taking this 5 days before your surgery.   Let us know if you need anything.  EXERCISES RANGE OF MOTION (ROM) AND STRETCHING EXERCISES  These exercises may help you when beginning to rehabilitate your issue. In order to successfully resolve your symptoms, you must improve your posture. These exercises are designed to help reduce the forward-head and rounded-shoulder posture which contributes to this condition. Your symptoms may resolve with or without further involvement from your physician, physical therapist or athletic trainer. While completing these exercises, remember:   Restoring tissue flexibility helps normal motion to return to the joints. This allows healthier, less painful movement and activity.  An effective stretch should be held for at least 20 seconds, although you may need to begin with shorter hold times for comfort.  A stretch should never be painful. You should only feel a gentle lengthening or release in the stretched tissue.  Do not do any stretch or exercise that you cannot tolerate.  STRETCH- Axial Extensors  Lie on your back on the floor. You may bend your knees for comfort. Place a rolled-up hand towel or dish towel, about 2 inches in diameter, under the part of your head that makes contact with the floor.  Gently tuck  your chin, as if trying to make a "double chin," until you feel a gentle stretch at the base of your head.  Hold 15-20 seconds. Repeat 2-3 times. Complete this exercise 1 time per day.   STRETCH - Axial Extension   Stand or sit on a firm surface. Assume a good posture: chest up, shoulders drawn back, abdominal muscles slightly tense, knees unlocked (if standing) and feet hip width apart.  Slowly retract your chin so your head slides back and your chin slightly lowers. Continue to look straight ahead.  You should feel a gentle stretch in the back of your head. Be certain not to feel an aggressive stretch since this can cause headaches later.  Hold for 15-20 seconds. Repeat 2-3 times. Complete this exercise 1 time per day.  STRETCH - Cervical Side Bend   Stand or sit on a firm surface. Assume a good posture: chest up, shoulders drawn back, abdominal muscles slightly tense, knees unlocked (if standing) and feet hip width apart.  Without letting your nose or shoulders move, slowly tip your right / left ear to your shoulder until your feel a gentle stretch in the muscles on the opposite side of your neck.  Hold 15-20 seconds. Repeat 2-3 times. Complete this exercise 1-2 times per day.  STRETCH - Cervical Rotators   Stand or sit on a firm surface. Assume a good posture: chest up, shoulders drawn back, abdominal muscles slightly tense, knees unlocked (if standing) and feet hip width apart.  Keeping your eyes level with the ground, slowly turn  your head until you feel a gentle stretch along the back and opposite side of your neck.  Hold 15-20 seconds. Repeat 2-3 times. Complete this exercise 1-2 times per day.  RANGE OF MOTION - Neck Circles   Stand or sit on a firm surface. Assume a good posture: chest up, shoulders drawn back, abdominal muscles slightly tense, knees unlocked (if standing) and feet hip width apart.  Gently roll your head down and around from the back of one shoulder to  the back of the other. The motion should never be forced or painful.  Repeat the motion 10-20 times, or until you feel the neck muscles relax and loosen. Repeat 2-3 times. Complete the exercise 1-2 times per day. STRENGTHENING EXERCISES - Cervical Strain and Sprain These exercises may help you when beginning to rehabilitate your injury. They may resolve your symptoms with or without further involvement from your physician, physical therapist, or athletic trainer. While completing these exercises, remember:   Muscles can gain both the endurance and the strength needed for everyday activities through controlled exercises.  Complete these exercises as instructed by your physician, physical therapist, or athletic trainer. Progress the resistance and repetitions only as guided.  You may experience muscle soreness or fatigue, but the pain or discomfort you are trying to eliminate should never worsen during these exercises. If this pain does worsen, stop and make certain you are following the directions exactly. If the pain is still present after adjustments, discontinue the exercise until you can discuss the trouble with your clinician.  STRENGTH - Cervical Flexors, Isometric  Face a wall, standing about 6 inches away. Place a small pillow, a ball about 6-8 inches in diameter, or a folded towel between your forehead and the wall.  Slightly tuck your chin and gently push your forehead into the soft object. Push only with mild to moderate intensity, building up tension gradually. Keep your jaw and forehead relaxed.  Hold 10 to 20 seconds. Keep your breathing relaxed.  Release the tension slowly. Relax your neck muscles completely before you start the next repetition. Repeat 2-3 times. Complete this exercise 1 time per day.  STRENGTH- Cervical Lateral Flexors, Isometric   Stand about 6 inches away from a wall. Place a small pillow, a ball about 6-8 inches in diameter, or a folded towel between the  side of your head and the wall.  Slightly tuck your chin and gently tilt your head into the soft object. Push only with mild to moderate intensity, building up tension gradually. Keep your jaw and forehead relaxed.  Hold 10 to 20 seconds. Keep your breathing relaxed.  Release the tension slowly. Relax your neck muscles completely before you start the next repetition. Repeat 2-3 times. Complete this exercise 1 time per day.  STRENGTH - Cervical Extensors, Isometric   Stand about 6 inches away from a wall. Place a small pillow, a ball about 6-8 inches in diameter, or a folded towel between the back of your head and the wall.  Slightly tuck your chin and gently tilt your head back into the soft object. Push only with mild to moderate intensity, building up tension gradually. Keep your jaw and forehead relaxed.  Hold 10 to 20 seconds. Keep your breathing relaxed.  Release the tension slowly. Relax your neck muscles completely before you start the next repetition. Repeat 2-3 times. Complete this exercise 1 time per day.  POSTURE AND BODY MECHANICS CONSIDERATIONS Keeping correct posture when sitting, standing or completing your activities will  reduce the stress put on different body tissues, allowing injured tissues a chance to heal and limiting painful experiences. The following are general guidelines for improved posture. Your physician or physical therapist will provide you with any instructions specific to your needs. While reading these guidelines, remember:  The exercises prescribed by your provider will help you have the flexibility and strength to maintain correct postures.  The correct posture provides the optimal environment for your joints to work. All of your joints have less wear and tear when properly supported by a spine with good posture. This means you will experience a healthier, less painful body.  Correct posture must be practiced with all of your activities, especially  prolonged sitting and standing. Correct posture is as important when doing repetitive low-stress activities (typing) as it is when doing a single heavy-load activity (lifting).  PROLONGED STANDING WHILE SLIGHTLY LEANING FORWARD When completing a task that requires you to lean forward while standing in one place for a long time, place either foot up on a stationary 2- to 4-inch high object to help maintain the best posture. When both feet are on the ground, the low back tends to lose its slight inward curve. If this curve flattens (or becomes too large), then the back and your other joints will experience too much stress, fatigue more quickly, and can cause pain.   RESTING POSITIONS Consider which positions are most painful for you when choosing a resting position. If you have pain with flexion-based activities (sitting, bending, stooping, squatting), choose a position that allows you to rest in a less flexed posture. You would want to avoid curling into a fetal position on your side. If your pain worsens with extension-based activities (prolonged standing, working overhead), avoid resting in an extended position such as sleeping on your stomach. Most people will find more comfort when they rest with their spine in a more neutral position, neither too rounded nor too arched. Lying on a non-sagging bed on your side with a pillow between your knees, or on your back with a pillow under your knees will often provide some relief. Keep in mind, being in any one position for a prolonged period of time, no matter how correct your posture, can still lead to stiffness.  WALKING Walk with an upright posture. Your ears, shoulders, and hips should all line up. OFFICE WORK When working at a desk, create an environment that supports good, upright posture. Without extra support, muscles fatigue and lead to excessive strain on joints and other tissues.  CHAIR:  A chair should be able to slide under your desk when your  back makes contact with the back of the chair. This allows you to work closely.  The chair's height should allow your eyes to be level with the upper part of your monitor and your hands to be slightly lower than your elbows.  Body position: ? Your feet should make contact with the floor. If this is not possible, use a foot rest. ? Keep your ears over your shoulders. This will reduce stress on your neck and low back.

## 2020-10-05 NOTE — Progress Notes (Signed)
Subjective:   Chief Complaint  Patient presents with  . Hospitalization Follow-up  . Neck Pain    Tiffany Estrada  is here for a Pre-operative physical at the request of Dr. Gerarda Fraction.   She  is having CT release surgery on 10/16/20 for CTS.  Personal or family hx of adverse outcome to anesthesia? No  Chipped, cracked, missing, or loose teeth? Yes L upper canine Decreased ROM of neck? Normally no, recently yes Able to walk up 2 flights of stairs without becoming significantly short of breath or having chest pain? Yes   Revised Goldman Criteria: High Risk Surgery (intraperitoneal, intrathoracic, aortic): No  Ischemic heart disease (Prior MI, +excercise stress test, angina, nitrate use, Qwave): No  History of heart failure: No  History of cerebrovascular disease: No  History of diabetes: Yes  Insulin therapy for DM: Yes  Preoperative Cr >2.0: No   Revised Goldman Criteria - risk for major cardiac death No risk factors -- 0.4 percent One risk factor -- 1.0 percent  Two risk factors -- 2.4 percent  Three or more risk factors -- 5.4 percent   Patient Active Problem List   Diagnosis Date Noted  . Type 2 diabetes mellitus with hyperglycemia, with long-term current use of insulin ([place]) 10/18/2019  . Dyslipidemia 10/18/2019  . Tobacco abuse 02/14/2016  . Essential hypertension 02/04/2016   Past Medical History:  Diagnosis Date  . Diabetes mellitus type 2 in obese ([place]) 02/14/2016  . Essential hypertension 02/04/2016  . History of chicken pox   . Hyperlipidemia   . Neuromuscular disorder ([place])   . Obesity 02/04/2016  . Tobacco abuse 02/14/2016    Past Surgical History:  Procedure Laterality Date  . ABDOMINAL HYSTERECTOMY     Fibroids  . APPENDECTOMY    . CHOLECYSTECTOMY    . COLONOSCOPY  over 10 years ago   in [place]-normal exam    Current Outpatient Medications  Medication Sig Dispense Refill  . acetaminophen (TYLENOL) 500 MG tablet Take 2 tablets (1,000 mg total) by mouth  every 6 (six) hours as needed. 30 tablet 0  . Alcohol Swabs PADS Use as directed 100 each 1  . amLODipine (NORVASC) 10 MG tablet Take 1 tablet (10 mg total) by mouth daily. 30 tablet 11  . amLODipine (NORVASC) 5 MG tablet Take 1 tablet by mouth once daily 90 tablet 0  . atorvastatin (LIPITOR) 40 MG tablet Take 1 tablet (40 mg total) by mouth daily. 90 tablet 1  . cholecalciferol (VITAMIN D3) 25 MCG (1000 UNIT) tablet Take 1,000 Units by mouth daily.    . clotrimazole-betamethasone (LOTRISONE) cream Apply 1 application topically 2 (two) times daily. 30 g 0  . cyclobenzaprine (FLEXERIL) 10 MG tablet Take 0.5-1 tablets (5-10 mg total) by mouth 3 (three) times daily as needed for muscle spasms. 21 tablet 0  . Dulaglutide (TRULICITY) 1.5 XE/9.4MH SOPN Inject 1.5 mg into the skin once a week. 6 mL 3  . gabapentin (NEURONTIN) 300 MG capsule     . glucose blood (ONETOUCH VERIO) test strip USE 1 STRIP TO CHECK GLUCOSE TWICE A WEEK 25 each 5  . insulin aspart protamine - aspart (NOVOLOG MIX 70/30 FLEXPEN) (70-30) 100 UNIT/ML FlexPen Inject 0.24 mLs (24 Units total) into the skin 2 (two) times daily with a meal. 45 mL 3  . Insulin Pen Needle (PEN NEEDLES) 32G X 4 MM MISC 1 Device by Does not apply route as directed. 100 each 6  . losartan (COZAAR) 100 MG tablet Take  1 tablet (100 mg total) by mouth daily. 30 tablet 11  . metFORMIN (GLUCOPHAGE) 500 MG tablet Take 1 tablet (500 mg total) by mouth daily with breakfast. 90 tablet 3  . Multiple Vitamins-Minerals (MULTI ADULT GUMMIES PO) Take by mouth. Take 1 gummies by mouth daily.    . Omega-3 1000 MG CAPS Take by mouth.    [name] DELICA LANCETS FINE MISC Use to check sugars twice weekly. 200 each 2  . prednisoLONE acetate (PRED FORTE) 1 % ophthalmic suspension Place 1 drop into the left eye 4 (four) times daily. 5 mL 0   Allergies  Allergen Reactions  . Latex   . Penicillins   . Red Dye   . Shellfish Allergy   . Sulfa Antibiotics     Family History   Problem Relation Age of Onset  . Pancreatitis Mother   . Diabetes Mother   . Pancreatitis Sister   . Pancreatitis Maternal Aunt   . Pancreatitis Maternal Uncle   . Colon cancer Neg Hx   . Colon polyps Neg Hx   . Esophageal cancer Neg Hx   . Rectal cancer Neg Hx   . Stomach cancer Neg Hx      Review of Systems:  Constitutional:  no fevers Eye:  no recent significant change in vision Ear:  no hearing loss Nose/Mouth/Throat:  No dental complaints Neck/Thyroid:  no lumps or masses Pulmonary:  No shortness of breath Cardiovascular:  no chest pain Gastrointestinal:  no abdominal pain GU:  negative for dysuria Musculoskeletal/Extremities:  no pain Skin/Integumentary ROS:  no abnormal skin lesions reported Neurologic:  no HA   Objective:   Vitals:   10/05/20 1008  BP: 140/78  Pulse: 94  Temp: 98.2 F (36.8 C)  TempSrc: Oral  SpO2: 97%  Weight: 177 lb 4 oz (80.4 kg)  Height: 5\' 6"  (1.676 m)   Body mass index is 28.61 kg/m.  General:  well developed, well nourished, in no apparent distress Skin:  warm, no pallor or diaphoresis Head:  normocephalic, atraumatic Eyes:  pupils equal and round, sclera anicteric without injection Ears:  canals without lesions, TMs shiny without retraction, no obvious effusion, no erythema Throat/Pharynx:  lips and gingiva without lesion; tongue and uvula midline; non-inflamed pharynx; no exudates or postnasal drainage; widespread dental decay, missing L max canine Neck: neck supple without adenopathy, thyromegaly, or masses, no bruits, no jugular venous distention; +TTP over the cerv parasp msc Lungs:  clear to auscultation, breath sounds equal bilaterally, no respiratory distress Cardio:  regular rate and rhythm without murmurs Abdomen:  abdomen soft, nontender; bowel sounds normal; no masses, hepatomegaly or splenomegaly Musculoskeletal:  symmetrical muscle groups noted without atrophy or deformity Extremities:  no clubbing, cyanosis, or  edema, no deformities, no skin discoloration Neuro:  gait normal; deep tendon reflexes normal and symmetric and alert and oriented to person, place, and time Psych: Age appropriate judgment and insight; normal mood   Assessment:   Pre-op exam - Plan: CBC, Comprehensive metabolic panel  Neck pain - Plan: methocarbamol (ROBAXIN) 500 MG tablet  Dysfunction of both eustachian tubes - Plan: fluticasone (FLONASE) 50 MCG/ACT nasal spray   Plan:   Ck labs. Acute neck pain: Heat, ice, Tylenol. Cont NSAIDs, will rec she stops 5 d prior to surg. Muscle relaxer, stretches/exercises. PT if no better.  Pending the above workup, the patient is deemed medically optimized for the proposed procedure. ETD: INCS The patient voiced understanding and agreement to the plan.  Crosby Oyster Homer,  DO 10/05/20  10:56 AM

## 2020-10-08 LAB — COMPREHENSIVE METABOLIC PANEL
ALT: 18 U/L (ref 0–35)
AST: 14 U/L (ref 0–37)
Albumin: 4.3 g/dL (ref 3.5–5.2)
Alkaline Phosphatase: 118 U/L — ABNORMAL HIGH (ref 39–117)
BUN: 8 mg/dL (ref 6–23)
CO2: 26 mEq/L (ref 19–32)
Calcium: 9.7 mg/dL (ref 8.4–10.5)
Chloride: 101 mEq/L (ref 96–112)
Creatinine, Ser: 0.66 mg/dL (ref 0.40–1.20)
GFR: 93.98 mL/min (ref >60.00)
Glucose, Bld: 263 mg/dL — ABNORMAL HIGH (ref 70–99)
Potassium: 3.8 mEq/L (ref 3.5–5.1)
Sodium: 137 mEq/L (ref 135–145)
Total Bilirubin: 0.5 mg/dL (ref 0.2–1.2)
Total Protein: 7.4 g/dL (ref 6.0–8.3)

## 2020-10-11 ENCOUNTER — Telehealth: Payer: Self-pay | Admitting: Family Medicine

## 2020-10-11 NOTE — Telephone Encounter (Signed)
Caller: Jeani Hawking ([facility]) Call  bacK [phone]  Would like to know the status of medical clearance form ax on 09/27/20

## 2020-10-15 NOTE — Telephone Encounter (Signed)
Clearance has been faxed.  

## 2020-10-17 ENCOUNTER — Ambulatory Visit: Payer: BC Managed Care – PPO | Admitting: Internal Medicine

## 2020-10-18 ENCOUNTER — Ambulatory Visit: Payer: BC Managed Care – PPO | Admitting: Dietician

## 2020-10-18 ENCOUNTER — Telehealth: Payer: Self-pay | Admitting: *Deleted

## 2020-10-18 NOTE — Telephone Encounter (Signed)
Provider called after hour Dr. Gerarda Fraction [phone] from [facility])-stated pt is scheduled for surgery tomorrow hemoglobin 8.7.

## 2020-10-19 ENCOUNTER — Encounter: Payer: Self-pay | Admitting: Internal Medicine

## 2020-10-19 NOTE — Telephone Encounter (Signed)
Left a message for a call back on 10/19/2020   Mack Guise, MD  [facility]  [facility] [address] [place], [place] [address] Phone: [phone] FAX: [phone]

## 2020-10-23 ENCOUNTER — Telehealth: Payer: Self-pay | Admitting: Pharmacist

## 2020-10-23 ENCOUNTER — Telehealth: Payer: Self-pay | Admitting: Internal Medicine

## 2020-10-23 NOTE — Telephone Encounter (Signed)
    Can you please call the pt and schedule her with me at the [facility] office ? If no openings please feel free to use an 11:30 spot   Her surgeon has contacted me about her diabetes control because he needs to do surgery on her.   Thanks

## 2020-10-23 NOTE — Telephone Encounter (Signed)
Dr Gerarda Fraction with [facility] is planning right carpal tunnel release and ulnar nerve decompression at the wrist and right elbow ulnar nerve decompression possible anterior transposition. He was trying to wait until A1c was < 8.0. Patient had A1c checked in their office 10/15/2020 and was 8.7% (increased form 8.2% in May 2022 but has been as high as 11%)  Patient missed last appointment with you. Looks like it was scheduled for [facility] office and patient preferred [facility].  Dr Gerarda Fraction would like your input regarding proceeding with surgery versus postponing until A1c < 8.0%. Do you think A1c in the 8.0's is as good as it might get?

## 2020-10-24 ENCOUNTER — Ambulatory Visit: Payer: BC Managed Care – PPO | Admitting: Family Medicine

## 2020-10-24 ENCOUNTER — Other Ambulatory Visit: Payer: Self-pay

## 2020-10-24 ENCOUNTER — Encounter: Payer: Self-pay | Admitting: Family Medicine

## 2020-10-24 VITALS — BP 128/80 | HR 88 | Temp 98.1°F | Ht 66.0 in | Wt 176.1 lb

## 2020-10-24 DIAGNOSIS — Z794 Long term (current) use of insulin: Secondary | ICD-10-CM | POA: Diagnosis not present

## 2020-10-24 DIAGNOSIS — E1165 Type 2 diabetes mellitus with hyperglycemia: Secondary | ICD-10-CM | POA: Diagnosis not present

## 2020-10-24 DIAGNOSIS — L282 Other prurigo: Secondary | ICD-10-CM | POA: Diagnosis not present

## 2020-10-24 MED ORDER — LEVOCETIRIZINE DIHYDROCHLORIDE 5 MG PO TABS: 5.0000 mg | ORAL_TABLET | Freq: Every evening | ORAL | 2 refills | Status: AC

## 2020-10-24 MED ORDER — DAPAGLIFLOZIN PROPANEDIOL 10 MG PO TABS: 10.0000 mg | ORAL_TABLET | Freq: Every day | ORAL | 2 refills | Status: DC

## 2020-10-24 NOTE — Telephone Encounter (Signed)
Pt scheduled to come to [facility] office 10/30/20 at 11:30 am.

## 2020-10-24 NOTE — Progress Notes (Signed)
Subjective:   Chief Complaint  Patient presents with   Follow-up    Tiffany Estrada is a 65 y.o. female here for follow-up of diabetes.   Kaliana's self monitored glucose range is high 100s and low 200s.  Patient denies hypoglycemic reactions.   Patient does require insulin.   Medications include: Metformin 383 mg daily, Trulicity 1.5 mg weekly Diet is fair.  Exercise: Walking No chest pain or shortness of breath  Past Medical History:  Diagnosis Date   Diabetes mellitus type 2 in obese ([place]) 02/14/2016   Essential hypertension 02/04/2016   History of chicken pox    Hyperlipidemia    Neuromuscular disorder ([place])    Obesity 02/04/2016   Tobacco abuse 02/14/2016     Objective:  BP 128/80   Pulse 88   Temp 98.1 F (36.7 C) (Oral)   Ht 5\' 6"  (1.676 m)   Wt 176 lb 2 oz (79.9 kg)   SpO2 96%   BMI 28.43 kg/m  General:  Well developed, well nourished, in no apparent distress Lungs:  CTAB, no access msc use Cardio:  RRR, no bruits, no LE edema Psych: Age appropriate judgment and insight  Assessment:   Type 2 diabetes mellitus with hyperglycemia, with long-term current use of insulin ([facility]) - Plan: dapagliflozin propanediol (FARXIGA) 10 MG TABS tablet  Pruritic rash - Plan: levocetirizine (XYZAL) 5 MG tablet   Plan:   Chronic, uncontrolled.  Goal is to get her less than 8 for surgery.  Add Farxiga 10 mg daily to Trulicity 1.5 mg weekly, metformin 500 mg daily, and NovoLog 70/30 24 units twice daily.  Continue to monitor sugars at home.  Counseled on diet and exercise.  She has an appointment with her endocrinologist next week. The patient voiced understanding and agreement to the plan.  Pepper Pike, DO 10/24/20 12:14 PM

## 2020-10-24 NOTE — Patient Instructions (Signed)
Let me know if there are cost issues.  No scented products while you are having irritation of the skin.  Please follow up with Dr. Kelton Pillar.   Let us know if you need anything.

## 2020-10-25 ENCOUNTER — Ambulatory Visit: Payer: BC Managed Care – PPO | Admitting: Internal Medicine

## 2020-10-25 NOTE — Telephone Encounter (Signed)
LM on VM of Dr Gerarda Fraction regarding plan for Dr Kelton Pillar to see pt 10/30/2020 for evaluation of DM.

## 2020-10-30 ENCOUNTER — Encounter: Payer: Self-pay | Admitting: Internal Medicine

## 2020-10-30 ENCOUNTER — Other Ambulatory Visit: Payer: Self-pay

## 2020-10-30 ENCOUNTER — Ambulatory Visit: Payer: BC Managed Care – PPO | Admitting: Internal Medicine

## 2020-10-30 VITALS — BP 158/90 | HR 92 | Ht 66.0 in | Wt 177.0 lb

## 2020-10-30 DIAGNOSIS — Z794 Long term (current) use of insulin: Secondary | ICD-10-CM

## 2020-10-30 DIAGNOSIS — E1165 Type 2 diabetes mellitus with hyperglycemia: Secondary | ICD-10-CM | POA: Diagnosis not present

## 2020-10-30 LAB — POCT GLUCOSE (DEVICE FOR HOME USE): POC Glucose: 245 mg/dl — AB (ref 70–99)

## 2020-10-30 MED ORDER — TRULICITY 1.5 MG/0.5ML ~~LOC~~ SOAJ: 1.5000 mg | SUBCUTANEOUS | 6 refills | Status: DC

## 2020-10-30 MED ORDER — FREESTYLE LIBRE 2 READER DEVI: 1.0000 | 0 refills | Status: DC

## 2020-10-30 MED ORDER — FREESTYLE LIBRE 2 SENSOR MISC: 1.0000 | 11 refills | Status: DC

## 2020-10-30 MED ORDER — METFORMIN HCL 500 MG PO TABS: 500.0000 mg | ORAL_TABLET | Freq: Every day | ORAL | 3 refills | Status: DC

## 2020-10-30 MED ORDER — NOVOLOG MIX 70/30 FLEXPEN (70-30) 100 UNIT/ML ~~LOC~~ SUPN: 24.0000 [IU] | PEN_INJECTOR | Freq: Two times a day (BID) | SUBCUTANEOUS | 3 refills | Status: DC

## 2020-10-30 NOTE — Patient Instructions (Signed)
-   Continue trulicity 1.5  mg weekly  - Increased  Novolog Mix 24 units with Breakfast and 24 units with Supper - Continue Metformin 500 mg, 1 tablet daily       -HOW TO TREAT LOW BLOOD SUGARS (Blood sugar LESS THAN 70 MG/DL) Please follow the RULE OF 15 for the treatment of hypoglycemia treatment (when your (blood sugars are less than 70 mg/dL)   STEP 1: Take 15 grams of carbohydrates when your blood sugar is low, which includes:  3-4 GLUCOSE TABS  OR 3-4 OZ OF JUICE OR REGULAR SODA OR ONE TUBE OF GLUCOSE GEL    STEP 2: RECHECK blood sugar in 15 MINUTES STEP 3: If your blood sugar is still low at the 15 minute recheck --> then, go back to STEP 1 and treat AGAIN with another 15 grams of carbohydrates.

## 2020-10-30 NOTE — Progress Notes (Signed)
Name: Tiffany Estrada  Age/ Sex: 65 y.o., female   MRN/ DOB: [id number], [birth year]     PCP: Tiffany Pal, DO   Reason for Endocrinology Evaluation: Type 2 Diabetes Mellitus  Initial Endocrine Consultative Visit: 10/18/2019    PATIENT IDENTIFIER: Tiffany Estrada is a 65 y.o. female with a past medical history of T2DM, HTN and Dyslipidemia. The patient has followed with Endocrinology clinic since 10/18/2019 for consultative assistance with management of her diabetes.  DIABETIC HISTORY:  Tiffany Estrada was diagnosed with V6HY in 0737, trulicity has been ineffective.  Her hemoglobin A1c has ranged from 7.5% in 2018, peaking at 11.9% in 2020.  On her initial visit to our clinic she had an A1c of 11.2%   , she was on Tresiba, and Metformin. We continued metformin , switched basal insulin to insulin Mix.   Trulicity started 05/624 SUBJECTIVE:   During the last visit (09/18/2020): A1c 8.2 %. We continued  Novolog mix,metformin and Increased Trulicity    Today (9/48/5462): Tiffany Estrada is here for a follow up on diabetes management.  She checks her blood sugars twice a week . The patient has not had hypoglycemic episodes since the last clinic visit.  She has a pending right carpal tunnel sx with A1c goal of  <7.5  Denies diarrhea  Wilder Glade has been cost prohibitive   HOME DIABETES REGIMEN:  Metformin 500 mg daily Novolog Mix 24 units BID - takes 20 units twice a day  Trulicity 1.5 mg weekly ( Wednesday)        Statin: yes ACE-I/ARB: Had losartan     METER DOWNLOAD SUMMARY: Did not bring    DIABETIC COMPLICATIONS: Microvascular complications:  Neuropathy Denies: CKD , retinopathy Last Eye Exam: Completed 08/2019  Macrovascular complications:   Denies: CAD, CVA, PVD   HISTORY:  Past Medical History:  Past Medical History:  Diagnosis Date  . Diabetes mellitus type 2 in obese ([place]) 02/14/2016  . Essential hypertension 02/04/2016  . History of chicken pox   .  Hyperlipidemia   . Neuromuscular disorder ([place])   . Obesity 02/04/2016  . Tobacco abuse 02/14/2016   Past Surgical History:  Past Surgical History:  Procedure Laterality Date  . ABDOMINAL HYSTERECTOMY     Fibroids  . APPENDECTOMY    . CHOLECYSTECTOMY    . COLONOSCOPY  over 10 years ago   in [place]-normal exam   Social History:  reports that she has been smoking cigarettes. She has been smoking an average of 0.25 packs per day. She has never used smokeless tobacco. She reports that she does not drink alcohol and does not use drugs. Family History:  Family History  Problem Relation Age of Onset  . Pancreatitis Mother   . Diabetes Mother   . Pancreatitis Sister   . Pancreatitis Maternal Aunt   . Pancreatitis Maternal Uncle   . Colon cancer Neg Hx   . Colon polyps Neg Hx   . Esophageal cancer Neg Hx   . Rectal cancer Neg Hx   . Stomach cancer Neg Hx      HOME MEDICATIONS: Allergies as of 10/30/2020       Reactions   Latex    Penicillins    Red Dye    Shellfish Allergy    Sulfa Antibiotics         Medication List        Accurate as of October 30, 2020 11:55 AM. If you have any questions, ask your nurse or  doctor.          acetaminophen 500 MG tablet Commonly known as: TYLENOL Take 2 tablets (1,000 mg total) by mouth every 6 (six) hours as needed.   Alcohol Swabs Pads Use as directed   amLODipine 5 MG tablet Commonly known as: NORVASC Take 1 tablet by mouth once daily   atorvastatin 40 MG tablet Commonly known as: LIPITOR Take 1 tablet (40 mg total) by mouth daily.   cholecalciferol 25 MCG (1000 UNIT) tablet Commonly known as: VITAMIN D3 Take 1,000 Units by mouth daily.   clotrimazole-betamethasone cream Commonly known as: Lotrisone Apply 1 application topically 2 (two) times daily.   dapagliflozin propanediol 10 MG Tabs tablet Commonly known as: FARXIGA Take 1 tablet (10 mg total) by mouth daily before breakfast.   fluticasone 50 MCG/ACT  nasal spray Commonly known as: FLONASE Place 2 sprays into both nostrils daily.   gabapentin 300 MG capsule Commonly known as: NEURONTIN   levocetirizine 5 MG tablet Commonly known as: XYZAL Take 1 tablet (5 mg total) by mouth every evening.   losartan 100 MG tablet Commonly known as: COZAAR Take 1 tablet (100 mg total) by mouth daily.   metFORMIN 500 MG tablet Commonly known as: GLUCOPHAGE Take 1 tablet (500 mg total) by mouth daily with breakfast.   methocarbamol 500 MG tablet Commonly known as: Robaxin Take 1 tablet (500 mg total) by mouth every 8 (eight) hours as needed for muscle spasms.   MULTI ADULT GUMMIES PO Take by mouth. Take 1 gummies by mouth daily.   NovoLOG Mix 70/30 FlexPen (70-30) 100 UNIT/ML FlexPen Generic drug: insulin aspart protamine - aspart Inject 0.24 mLs (24 Units total) into the skin 2 (two) times daily with a meal.   Omega-3 1000 MG Caps Take by mouth.   OneTouch Delica Lancets Fine Misc Use to check sugars twice weekly.   OneTouch Verio test strip Generic drug: glucose blood USE 1 STRIP TO CHECK GLUCOSE TWICE A WEEK   Pen Needles 32G X 4 MM Misc 1 Device by Does not apply route as directed.   prednisoLONE acetate 1 % ophthalmic suspension Commonly known as: Pred Forte Place 1 drop into the left eye 4 (four) times daily.   Trulicity 1.5 WP/8.0DX Sopn Generic drug: Dulaglutide Inject 1.5 mg into the skin once a week.         OBJECTIVE:   Vital Signs: BP (!) 158/90   Pulse 92   Ht 5\' 6"  (1.676 m)   Wt 177 lb (80.3 kg)   SpO2 97%   BMI 28.57 kg/m   Wt Readings from Last 3 Encounters:  10/30/20 177 lb (80.3 kg)  10/24/20 176 lb 2 oz (79.9 kg)  10/05/20 177 lb 4 oz (80.4 kg)     Exam: General: Pt appears well and is in NAD  Lungs: Clear with good BS bilat with no rales, rhonchi, or wheezes  Heart: RRR with normal S1 and S2 and no gallops; no murmurs; no rub  Extremities: No pretibial edema on the right but has left  foot  boot  Neuro: MS is good with appropriate affect, pt is alert and Ox3   DM Foot Exam 10/30/2020   The skin of the feet is intact without sores or ulcerations. The pedal pulses are 2+ on right and 2+ on left. The sensation is decreased to a screening 5.07, 10 gram monofilament bilaterally    DATA REVIEWED:  Lab Results  Component Value Date   HGBA1C 8.2 (A)  09/18/2020   HGBA1C 9.8 (A) 06/26/2020   HGBA1C 9.2 (A) 02/21/2020   Lab Results  Component Value Date   MICROALBUR 1.6 02/25/2019   LDLCALC 53 07/17/2020   CREATININE 0.66 10/05/2020   Lab Results  Component Value Date   MICRALBCREAT 2.3 02/25/2019     Lab Results  Component Value Date   CHOL 109 07/17/2020   HDL 38.40 (L) 07/17/2020   LDLCALC 53 07/17/2020   TRIG 91.0 07/17/2020   CHOLHDL 3 07/17/2020         ASSESSMENT / PLAN / RECOMMENDATIONS:   1) Type 2 Diabetes Mellitus, with improving glycemic control, With neuropathic  complications - Most recent A1c of 8.7 %. Goal A1c <7.0 %.     She understands the need to improve her glycemic control prior to surgery to reduce risk of infection  - She has dietary indiscretions, we discussed the serving size of banana is half, I encouraged her to see our RD but she declines today stating she has the paper at home  - She has been out of the trulicity for a week but apparently had an interrupted intake from the time she last saw me until she went to see her  surgeon hence the A1c elevated from 8.2 % to 8.7 %  - PCP prescribed Wilder Glade last week but she has not yet tried to pick it up. I explained to her that she can take [place] while on Trulicity , we discussed the mechanism of action of both classes, she has opted to hold off on starting SGLT-2 inhibitors at this time until after her sx   - Historically she has used sub-optimal doses of insulin  - freestyle libre prescription sent    MEDICATIONS: Continue Metformin 500 mg daily  Increase  Novolog Mix 24 units  BID  Restart  Trulicity 1.5  mg weekly  She would like to hold off on SGLT-2 inhibitors until after her sx      EDUCATION / INSTRUCTIONS: BG monitoring instructions: Patient is instructed to check her blood sugars 2 times a day, before breakfast and supper . Call [facility] if: BG persistently < 70 I reviewed the Rule of 15 for the treatment of hypoglycemia in detail with the patient. Literature supplied.   2) Diabetic complications:  Eye: Does not have known diabetic retinopathy.  Neuro/ Feet: Does have known diabetic peripheral neuropathy .  Renal: Patient does not  have known baseline CKD. She   is not on an ACEI/ARB at present.     F/U in 2 months   I spent 25 minutes preparing to see the patient by review of recent labs, imaging and procedures, obtaining and reviewing separately obtained history, communicating with the patient/family or caregiver, ordering medications, tests or procedures, and documenting clinical information in the EHR including the differential Dx, treatment, and any further evaluation and other management     Signed electronically by: Mack Guise, MD  [facility]  [facility] [address] [place], [place] [address] Phone: [phone] FAX: [phone]   CC: Tiffany Estrada, [address] [place] [address] Phone: [phone]  Fax: [phone]  Return to Endocrinology clinic as below: Future Appointments  Date Time Provider [place]  01/18/2021 10:30 AM Tiffany Pal, DO [facility]  01/22/2021 10:30 AM Lyle Niblett, Melanie Crazier, MD [facility]

## 2020-11-08 ENCOUNTER — Telehealth: Payer: Self-pay | Admitting: Internal Medicine

## 2020-11-08 NOTE — Telephone Encounter (Signed)
Patient called to get dosing clarification for Trulicity and Farxiga. Please call patient at [phone]

## 2020-11-09 NOTE — Telephone Encounter (Signed)
Verified pt --is ok to take trulicity and farxiga. Pt  voiced understanding.

## 2020-11-09 NOTE — Telephone Encounter (Signed)
Patient called again following up on her call from yesterday

## 2020-12-25 ENCOUNTER — Encounter: Payer: Self-pay | Admitting: Internal Medicine

## 2020-12-25 ENCOUNTER — Other Ambulatory Visit: Payer: Self-pay

## 2020-12-25 ENCOUNTER — Ambulatory Visit: Payer: BC Managed Care – PPO | Admitting: Internal Medicine

## 2020-12-25 ENCOUNTER — Ambulatory Visit: Payer: BC Managed Care – PPO | Admitting: Family Medicine

## 2020-12-25 VITALS — BP 148/88 | HR 90 | Ht 66.0 in | Wt 177.0 lb

## 2020-12-25 DIAGNOSIS — E1142 Type 2 diabetes mellitus with diabetic polyneuropathy: Secondary | ICD-10-CM | POA: Diagnosis not present

## 2020-12-25 DIAGNOSIS — E1165 Type 2 diabetes mellitus with hyperglycemia: Secondary | ICD-10-CM

## 2020-12-25 DIAGNOSIS — Z794 Long term (current) use of insulin: Secondary | ICD-10-CM | POA: Diagnosis not present

## 2020-12-25 LAB — POCT GLYCOSYLATED HEMOGLOBIN (HGB A1C): Hemoglobin A1C: 7.7 % — AB (ref 4.0–5.6)

## 2020-12-25 MED ORDER — DEXCOM G6 TRANSMITTER MISC: 1.0000 | 3 refills | Status: DC

## 2020-12-25 MED ORDER — DAPAGLIFLOZIN PROPANEDIOL 10 MG PO TABS: 10.0000 mg | ORAL_TABLET | Freq: Every day | ORAL | 3 refills | Status: DC

## 2020-12-25 MED ORDER — DEXCOM G6 SENSOR MISC: 1.0000 | 3 refills | Status: DC

## 2020-12-25 NOTE — Progress Notes (Signed)
Name: Tiffany Estrada  Age/ Sex: 65 y.o., female   MRN/ DOB: [id number], June 05, 1955     PCP: Shelda Pal, DO   Reason for Endocrinology Evaluation: Type 2 Diabetes Mellitus  Initial Endocrine Consultative Visit: 10/18/2019    PATIENT IDENTIFIER: [name] is a 65 y.o. female with a past medical history of T2DM, HTN and Dyslipidemia. The patient has followed with Endocrinology clinic since 10/18/2019 for consultative assistance with management of her diabetes.  DIABETIC HISTORY:  Tiffany Estrada was diagnosed with [id number] in 0000000, trulicity has been ineffective.  Her hemoglobin A1c has ranged from 7.5% in 2018, peaking at 11.9% in 2020.  On her initial visit to our clinic she had an A1c of 11.2%   , she was on Tresiba, and Metformin. We continued metformin , switched basal insulin to insulin Mix.   Trulicity started [id number]  [name] through PCP in 10/2020   SUBJECTIVE:   During the last visit (10/30/2020): A1c 8.7 %. We restarted Trulicity, increased insulin and continued Metformin      Today (12/25/2020): [name] is here for a follow up on diabetes management.  She checks her blood sugars twice a week . The patient has not had hypoglycemic episodes since the last clinic visit.  She has a pending right carpal tunnel sx with A1c goal of  <7.5  Denies diarrhea   Freestyle Elenor Legato is not covered ~ $70 a month   Has Tinea corporis on the legs   HOME DIABETES REGIMEN:  Metformin 500 mg daily Novolog Mix 24 units BID  Trulicity 1.5 mg weekly ( Wednesday)  Farxiga 10 mg daily       Statin: yes ACE-I/ARB: Had losartan     METER DOWNLOAD SUMMARY: A month download had only 2 readings 125 and 139 mg/dL      DIABETIC COMPLICATIONS: Microvascular complications:  Neuropathy Denies: CKD , retinopathy Last Eye Exam: Completed 08/2019  Macrovascular complications:   Denies: CAD, CVA, PVD   HISTORY:  Past Medical History:  Past Medical History:   Diagnosis Date  . Diabetes mellitus type 2 in obese ([place]) 02/14/2016  . Essential hypertension 02/04/2016  . History of chicken pox   . Hyperlipidemia   . Neuromuscular disorder ([place])   . Obesity 02/04/2016  . Tobacco abuse 02/14/2016   Past Surgical History:  Past Surgical History:  Procedure Laterality Date  . ABDOMINAL HYSTERECTOMY     Fibroids  . APPENDECTOMY    . CHOLECYSTECTOMY    . COLONOSCOPY  over 10 years ago   in [place]-normal exam   Social History:  reports that she has been smoking cigarettes. She has been smoking an average of .25 packs per day. She has never used smokeless tobacco. She reports that she does not drink alcohol and does not use drugs. Family History:  Family History  Problem Relation Age of Onset  . Pancreatitis Mother   . Diabetes Mother   . Pancreatitis Sister   . Pancreatitis Maternal Aunt   . Pancreatitis Maternal Uncle   . Colon cancer Neg Hx   . Colon polyps Neg Hx   . Esophageal cancer Neg Hx   . Rectal cancer Neg Hx   . Stomach cancer Neg Hx      HOME MEDICATIONS: Allergies as of 12/25/2020       Reactions   Latex    Penicillins    Red Dye    Shellfish Allergy    Sulfa Antibiotics  Medication List        Accurate as of December 25, 2020  3:12 PM. If you have any questions, ask your nurse or doctor.          STOP taking these medications    FreeStyle Libre 2 Reader Kerrin Mo Stopped by: Dorita Sciara, MD       TAKE these medications    acetaminophen 500 MG tablet Commonly known as: TYLENOL Take 2 tablets (1,000 mg total) by mouth every 6 (six) hours as needed.   Alcohol Swabs Pads Use as directed   amLODipine 5 MG tablet Commonly known as: NORVASC Take 1 tablet by mouth once daily   atorvastatin 40 MG tablet Commonly known as: LIPITOR Take 1 tablet (40 mg total) by mouth daily.   cholecalciferol 25 MCG (1000 UNIT) tablet Commonly known as: VITAMIN D3 Take 1,000 Units by mouth  daily.   clotrimazole-betamethasone cream Commonly known as: Lotrisone Apply 1 application topically 2 (two) times daily.   dapagliflozin propanediol 10 MG Tabs tablet Commonly known as: FARXIGA Take 1 tablet (10 mg total) by mouth daily before breakfast.   Dexcom G6 Sensor Misc 1 Device by Does not apply route as directed.   Dexcom G6 Transmitter Misc 1 Device by Does not apply route as directed. Started by: Dorita Sciara, MD   fluticasone 50 MCG/ACT nasal spray Commonly known as: FLONASE Place 2 sprays into both nostrils daily.   gabapentin 300 MG capsule Commonly known as: NEURONTIN   levocetirizine 5 MG tablet Commonly known as: XYZAL Take 1 tablet (5 mg total) by mouth every evening.   losartan 100 MG tablet Commonly known as: COZAAR Take 1 tablet (100 mg total) by mouth daily.   metFORMIN 500 MG tablet Commonly known as: GLUCOPHAGE Take 1 tablet (500 mg total) by mouth daily with breakfast.   methocarbamol 500 MG tablet Commonly known as: Robaxin Take 1 tablet (500 mg total) by mouth every 8 (eight) hours as needed for muscle spasms.   MULTI ADULT GUMMIES PO Take by mouth. Take 1 gummies by mouth daily.   NovoLOG Mix 70/30 FlexPen (70-30) 100 UNIT/ML FlexPen Generic drug: insulin aspart protamine - aspart Inject 0.24 mLs (24 Units total) into the skin 2 (two) times daily with a meal.   Omega-3 1000 MG Caps Take by mouth.   OneTouch Delica Lancets Fine Misc Use to check sugars twice weekly.   OneTouch Verio test strip Generic drug: glucose blood USE 1 STRIP TO CHECK GLUCOSE TWICE A WEEK   Pen Needles 32G X 4 MM Misc 1 Device by Does not apply route as directed.   prednisoLONE acetate 1 % ophthalmic suspension Commonly known as: Pred Forte Place 1 drop into the left eye 4 (four) times daily.   Trulicity 1.5 0000000 Sopn Generic drug: Dulaglutide Inject 1.5 mg into the skin once a week.         OBJECTIVE:   Vital Signs: BP (!)  148/88 (BP Location: Left Arm, Patient Position: Sitting, Cuff Size: Normal)   Pulse 90   Ht '5\' 6"'$  (1.676 m)   Wt 177 lb (80.3 kg)   SpO2 95%   BMI 28.57 kg/m   Wt Readings from Last 3 Encounters:  12/25/20 177 lb (80.3 kg)  10/30/20 177 lb (80.3 kg)  10/24/20 176 lb 2 oz (79.9 kg)     Exam: General: Pt appears well and is in NAD  Lungs: Clear with good BS bilat with no rales, rhonchi, or wheezes  Heart: RRR with normal S1 and S2 and no gallops; no murmurs; no rub  Extremities: No pretibial edema on the right but has left foot  boot  Neuro: MS is good with appropriate affect, pt is alert and Ox3   DM Foot Exam 10/30/2020   The skin of the feet is intact without sores or ulcerations. The pedal pulses are 2+ on right and 2+ on left. The sensation is decreased to a screening 5.07, 10 gram monofilament bilaterally    DATA REVIEWED:  Lab Results  Component Value Date   HGBA1C 7.7 (A) 12/25/2020   HGBA1C 8.2 (A) 09/18/2020   HGBA1C 9.8 (A) 06/26/2020   Lab Results  Component Value Date   MICROALBUR 1.6 02/25/2019   LDLCALC 53 07/17/2020   CREATININE 0.66 10/05/2020   Lab Results  Component Value Date   MICRALBCREAT 2.3 02/25/2019     Lab Results  Component Value Date   CHOL 109 07/17/2020   HDL 38.40 (L) 07/17/2020   LDLCALC 53 07/17/2020   TRIG 91.0 07/17/2020   CHOLHDL 3 07/17/2020         ASSESSMENT / PLAN / RECOMMENDATIONS:   1) Type 2 Diabetes Mellitus, with improving glycemic control, With neuropathic  complications - Most recent A1c of 7.7  %. Goal A1c <7.0 %.     - I have praised the pt on improved glycemic control , A1c down from 8.2 %  - I have emphasized the importance of avoiding sugar-sweetened beverages and compliance  with medication intake - She is cleared for her hand surgery with an Ac < 8.0 %  - Dr. Gerarda Fraction will be notified  -She needs a refill on [place] is not covered, Dexcom has been prescribed to [facility], pt to  contact me when she receives it for training referral    MEDICATIONS: Continue Metformin 500 mg daily  Continue Novolog Mix 24 units BID  Continue Trulicity 1.5  mg weekly  Continue Farxiga 10 mg daily     EDUCATION / INSTRUCTIONS: BG monitoring instructions: Patient is instructed to check her blood sugars 2 times a day, before breakfast and supper . Call [facility] if: BG persistently < 70 I reviewed the Rule of 15 for the treatment of hypoglycemia in detail with the patient. Literature supplied.   2) Diabetic complications:  Eye: Does not have known diabetic retinopathy.  Neuro/ Feet: Does have known diabetic peripheral neuropathy .  Renal: Patient does not  have known baseline CKD. She   is not on an ACEI/ARB at present.     F/U in 4 months    Addendum: left a message for Dr. Gerarda Fraction 12/26/2020 11:45  Signed electronically by: Mack Guise, MD  [facility] Endocrinology  [facility] [address] [place], [place] [address] Phone: [phone] FAX: [phone]   CC: Shelda Pal, [address] [place] [address] Phone: [phone]  Fax: [phone]  Return to Endocrinology clinic as below: Future Appointments  Date Time Provider [place]  01/18/2021 10:30 AM Shelda Pal, DO [facility]  01/22/2021 10:30 AM Majel Giel, Melanie Crazier, MD [facility]

## 2020-12-25 NOTE — Patient Instructions (Addendum)
-   Continue trulicity 1.5  mg weekly  - Continue  Novolog Mix 24 units with Breakfast and 24 units with Supper - Continue Metformin 500 mg, 1 tablet daily  - Continue Farxiga 10 mg , 1 tablet in the morning       -HOW TO TREAT LOW BLOOD SUGARS (Blood sugar LESS THAN 70 MG/DL) Please follow the RULE OF 15 for the treatment of hypoglycemia treatment (when your (blood sugars are less than 70 mg/dL)   STEP 1: Take 15 grams of carbohydrates when your blood sugar is low, which includes:  3-4 GLUCOSE TABS  OR 3-4 OZ OF JUICE OR REGULAR SODA OR ONE TUBE OF GLUCOSE GEL    STEP 2: RECHECK blood sugar in 15 MINUTES STEP 3: If your blood sugar is still low at the 15 minute recheck --> then, go back to STEP 1 and treat AGAIN with another 15 grams of carbohydrates.

## 2021-01-10 ENCOUNTER — Telehealth: Payer: Self-pay | Admitting: Pharmacy Technician

## 2021-01-10 NOTE — Telephone Encounter (Addendum)
Patient Advocate Encounter   Received notification from [employer] that prior authorization for [place] is required.   PA submitted on 01/10/2021 Key [id number] SENSOR Key [id number] TRANSMITTER Status is APPROVED  01/10/2021 - 01/10/2022    [facility] will continue to follow   Ronney Asters, CPhT Patient Advocate [facility] Phone: [phone] Fax:  [phone]

## 2021-01-18 ENCOUNTER — Ambulatory Visit: Payer: BC Managed Care – PPO | Admitting: Family Medicine

## 2021-01-22 ENCOUNTER — Ambulatory Visit: Payer: BC Managed Care – PPO | Admitting: Internal Medicine

## 2021-02-19 ENCOUNTER — Other Ambulatory Visit: Payer: Self-pay

## 2021-02-19 ENCOUNTER — Ambulatory Visit (INDEPENDENT_AMBULATORY_CARE_PROVIDER_SITE_OTHER): Payer: BC Managed Care – PPO | Admitting: Internal Medicine

## 2021-02-19 VITALS — BP 136/90 | HR 84 | Ht 66.0 in | Wt 177.0 lb

## 2021-02-19 DIAGNOSIS — Z794 Long term (current) use of insulin: Secondary | ICD-10-CM | POA: Diagnosis not present

## 2021-02-19 DIAGNOSIS — E1165 Type 2 diabetes mellitus with hyperglycemia: Secondary | ICD-10-CM | POA: Diagnosis not present

## 2021-02-19 LAB — POCT GLYCOSYLATED HEMOGLOBIN (HGB A1C): Hemoglobin A1C: 7.7 % — AB (ref 4.0–5.6)

## 2021-02-19 LAB — GLUCOSE, POCT (MANUAL RESULT ENTRY): POC Glucose: 197 mg/dl — AB (ref 70–99)

## 2021-02-19 NOTE — Progress Notes (Signed)
Name: Tiffany Estrada  Age/ Sex: 65 y.o., female   MRN/ DOB: [id number], Jul 07, 1955     PCP: Shelda Pal, DO   Reason for Endocrinology Evaluation: Type 2 Diabetes Mellitus  Initial Endocrine Consultative Visit: 10/18/2019    PATIENT IDENTIFIER: Tiffany Estrada is a 65 y.o. female with a past medical history of T2DM, HTN and Dyslipidemia. The patient has followed with Endocrinology clinic since 10/18/2019 for consultative assistance with management of her diabetes.  DIABETIC HISTORY:  Ms. Kalisz was diagnosed with Y7WG in 9562, trulicity has been ineffective.  Her hemoglobin A1c has ranged from 7.5% in 2018, peaking at 11.9% in 2020.  On her initial visit to our clinic she had an A1c of 11.2%   , she was on Tresiba, and Metformin. We continued metformin , switched basal insulin to insulin Mix.   Trulicity started 05/3084  [name] through PCP in 10/2020   SUBJECTIVE:   During the last visit (8/232022): A1c 7.7 %. We continued Trulicity,insulin and  Metformin      Today (02/19/2021): Ms. Poehlman is here for a follow up on diabetes management.  She checks her blood sugars twice a week . The patient has not had hypoglycemic episodes since the last clinic visit.  She is S/P carpal tunnel release and ulnar nerve decompression at the right wrist 01/22/2021  She stopped using Trulicity  and [place] due to loss of insurance on 02/01/2021  HOME DIABETES REGIMEN:  Metformin 500 mg daily Novolog Mix 24 units BID  Trulicity 1.5 mg weekly ( Wednesday)  Farxiga 10 mg daily       Statin: yes ACE-I/ARB:  losartan     METER DOWNLOAD SUMMARY: Did not bring     DIABETIC COMPLICATIONS: Microvascular complications:  Neuropathy Denies: CKD , retinopathy Last Eye Exam: Completed 09/03/2019  Macrovascular complications:   Denies: CAD, CVA, PVD   HISTORY:  Past Medical History:  Past Medical History:  Diagnosis Date  . Diabetes mellitus type 2 in obese ([place])  02/14/2016  . Essential hypertension 02/04/2016  . History of chicken pox   . Hyperlipidemia   . Neuromuscular disorder ([place])   . Obesity 02/04/2016  . Tobacco abuse 02/14/2016   Past Surgical History:  Past Surgical History:  Procedure Laterality Date  . ABDOMINAL HYSTERECTOMY     Fibroids  . APPENDECTOMY    . CHOLECYSTECTOMY    . COLONOSCOPY  over 10 years ago   in [place]-normal exam   Social History:  reports that she has been smoking cigarettes. She has been smoking an average of .25 packs per day. She has never used smokeless tobacco. She reports that she does not drink alcohol and does not use drugs. Family History:  Family History  Problem Relation Age of Onset  . Pancreatitis Mother   . Diabetes Mother   . Pancreatitis Sister   . Pancreatitis Maternal Aunt   . Pancreatitis Maternal Uncle   . Colon cancer Neg Hx   . Colon polyps Neg Hx   . Esophageal cancer Neg Hx   . Rectal cancer Neg Hx   . Stomach cancer Neg Hx      HOME MEDICATIONS: Allergies as of 02/19/2021       Reactions   Latex    Penicillins    Red Dye    Shellfish Allergy    Sulfa Antibiotics         Medication List        Accurate as of February 19, 2021 11:17 AM. If you have any questions, ask your nurse or doctor.          acetaminophen 500 MG tablet Commonly known as: TYLENOL Take 2 tablets (1,000 mg total) by mouth every 6 (six) hours as needed.   Alcohol Swabs Pads Use as directed   amLODipine 5 MG tablet Commonly known as: NORVASC Take 1 tablet by mouth once daily   atorvastatin 40 MG tablet Commonly known as: LIPITOR Take 1 tablet (40 mg total) by mouth daily.   cholecalciferol 25 MCG (1000 UNIT) tablet Commonly known as: VITAMIN D3 Take 1,000 Units by mouth daily.   clotrimazole-betamethasone cream Commonly known as: Lotrisone Apply 1 application topically 2 (two) times daily.   dapagliflozin propanediol 10 MG Tabs tablet Commonly known as: FARXIGA Take  1 tablet (10 mg total) by mouth daily before breakfast.   Dexcom G6 Sensor Misc 1 Device by Does not apply route as directed.   Dexcom G6 Transmitter Misc 1 Device by Does not apply route as directed.   fluticasone 50 MCG/ACT nasal spray Commonly known as: FLONASE Place 2 sprays into both nostrils daily.   gabapentin 300 MG capsule Commonly known as: NEURONTIN   levocetirizine 5 MG tablet Commonly known as: XYZAL Take 1 tablet (5 mg total) by mouth every evening.   losartan 100 MG tablet Commonly known as: COZAAR Take 1 tablet (100 mg total) by mouth daily.   metFORMIN 500 MG tablet Commonly known as: GLUCOPHAGE Take 1 tablet (500 mg total) by mouth daily with breakfast.   methocarbamol 500 MG tablet Commonly known as: Robaxin Take 1 tablet (500 mg total) by mouth every 8 (eight) hours as needed for muscle spasms.   MULTI ADULT GUMMIES PO Take by mouth. Take 1 gummies by mouth daily.   NovoLOG Mix 70/30 FlexPen (70-30) 100 UNIT/ML FlexPen Generic drug: insulin aspart protamine - aspart Inject 0.24 mLs (24 Units total) into the skin 2 (two) times daily with a meal.   Omega-3 1000 MG Caps Take by mouth.   OneTouch Delica Lancets Fine Misc Use to check sugars twice weekly.   OneTouch Verio test strip Generic drug: glucose blood USE 1 STRIP TO CHECK GLUCOSE TWICE A WEEK   Pen Needles 32G X 4 MM Misc 1 Device by Does not apply route as directed.   prednisoLONE acetate 1 % ophthalmic suspension Commonly known as: Pred Forte Place 1 drop into the left eye 4 (four) times daily.   Trulicity 1.5 NO/0.3BC Sopn Generic drug: Dulaglutide Inject 1.5 mg into the skin once a week.         OBJECTIVE:   Vital Signs: BP 136/90 (BP Location: Left Arm, Patient Position: Sitting, Cuff Size: Small)   Pulse 84   Ht 5\' 6"  (1.676 m)   Wt 177 lb (80.3 kg)   SpO2 99%   BMI 28.57 kg/m   Wt Readings from Last 3 Encounters:  02/19/21 177 lb (80.3 kg)  12/25/20 177 lb (80.3  kg)  10/30/20 177 lb (80.3 kg)     Exam: General: Pt appears well and is in NAD  Lungs: Clear with good BS bilat with no rales, rhonchi, or wheezes  Heart: RRR with normal S1 and S2 and no gallops; no murmurs; no rub  Extremities: No pretibial edema   Neuro: MS is good with appropriate affect, pt is alert and Ox3   DM Foot Exam 10/30/2020   The skin of the feet is intact without sores or ulcerations.  The pedal pulses are 2+ on right and 2+ on left. The sensation is decreased to a screening 5.07, 10 gram monofilament bilaterally    DATA REVIEWED:  Lab Results  Component Value Date   HGBA1C 7.7 (A) 02/19/2021   HGBA1C 7.7 (A) 12/25/2020   HGBA1C 8.2 (A) 09/18/2020   Lab Results  Component Value Date   MICROALBUR 1.6 02/25/2019   LDLCALC 53 07/17/2020   CREATININE 0.66 10/05/2020   Lab Results  Component Value Date   MICRALBCREAT 2.3 02/25/2019     Lab Results  Component Value Date   CHOL 109 07/17/2020   HDL 38.40 (L) 07/17/2020   LDLCALC 53 07/17/2020   TRIG 91.0 07/17/2020   CHOLHDL 3 07/17/2020         ASSESSMENT / PLAN / RECOMMENDATIONS:   1) Type 2 Diabetes Mellitus, with improving glycemic control, With neuropathic  complications - Most recent A1c of 7.7  %. Goal A1c <7.0 %.     - A1c stable  - She took Novolog mix this am without eating breakfast , I have STRONGLY discouraged her from this, she was again reminded to take insulin within 5-10 minutes of eating her first and last meal of the day due to risk of hypoglycemia  - She lost her insurance, she was provided with pt assistance forms for humalog/Trulicity  and Farxiga  - Will increase her morning dose of insulin    MEDICATIONS: Continue Metformin 500 mg daily  Change Novolog Mix 26 units with breakfast and 24 units with Supper  Continue Trulicity 1.5  mg weekly  Continue Farxiga 10 mg daily     EDUCATION / INSTRUCTIONS: BG monitoring instructions: Patient is instructed to check her blood  sugars 2 times a day, before breakfast and supper . Call [facility] if: BG persistently < 70 I reviewed the Rule of 15 for the treatment of hypoglycemia in detail with the patient. Literature supplied.   2) Diabetic complications:  Eye: Does not have known diabetic retinopathy.  Neuro/ Feet: Does have known diabetic peripheral neuropathy .  Renal: Patient does not  have known baseline CKD. She   is not on an ACEI/ARB at present.     F/U in 4 months     Signed electronically by: Mack Guise, MD  [facility] Endocrinology  [facility] [address] [place], [place] [address] Phone: [phone] FAX: [phone]   CC: Shelda Pal, [address] [place] [address] Phone: [phone]  Fax: [phone]  Return to Endocrinology clinic as below: Future Appointments  Date Time Provider [place]  02/20/2021 10:45 AM Shelda Pal, DO [facility] [place]

## 2021-02-19 NOTE — Patient Instructions (Signed)
-   Continue  Novolog Mix 26 units with Breakfast and 24 units with Supper - Continue Metformin 500 mg, 1 tablet daily  - Continue Farxiga 10 mg , 1 tablet in the morning  - Continue Trulicity 1.5 mg  weekly       -HOW TO TREAT LOW BLOOD SUGARS (Blood sugar LESS THAN 70 MG/DL) Please follow the RULE OF 15 for the treatment of hypoglycemia treatment (when your (blood sugars are less than 70 mg/dL)   STEP 1: Take 15 grams of carbohydrates when your blood sugar is low, which includes:  3-4 GLUCOSE TABS  OR 3-4 OZ OF JUICE OR REGULAR SODA OR ONE TUBE OF GLUCOSE GEL    STEP 2: RECHECK blood sugar in 15 MINUTES STEP 3: If your blood sugar is still low at the 15 minute recheck --> then, go back to STEP 1 and treat AGAIN with another 15 grams of carbohydrates.

## 2021-02-20 ENCOUNTER — Ambulatory Visit (INDEPENDENT_AMBULATORY_CARE_PROVIDER_SITE_OTHER): Payer: BC Managed Care – PPO | Admitting: Family Medicine

## 2021-02-20 ENCOUNTER — Encounter: Payer: Self-pay | Admitting: Family Medicine

## 2021-02-20 VITALS — BP 120/80 | HR 75 | Temp 98.1°F | Ht 66.0 in | Wt 178.4 lb

## 2021-02-20 DIAGNOSIS — E785 Hyperlipidemia, unspecified: Secondary | ICD-10-CM

## 2021-02-20 DIAGNOSIS — L989 Disorder of the skin and subcutaneous tissue, unspecified: Secondary | ICD-10-CM | POA: Diagnosis not present

## 2021-02-20 DIAGNOSIS — Z23 Encounter for immunization: Secondary | ICD-10-CM | POA: Diagnosis not present

## 2021-02-20 DIAGNOSIS — I10 Essential (primary) hypertension: Secondary | ICD-10-CM | POA: Diagnosis not present

## 2021-02-20 LAB — LIPID PANEL
Cholesterol: 179 mg/dL (ref 0–200)
HDL: 47.9 mg/dL (ref >39.00)
LDL Cholesterol: 108 mg/dL — ABNORMAL HIGH (ref 0–99)
NonHDL: 130.84
Total CHOL/HDL Ratio: 4
Triglycerides: 114 mg/dL (ref 0.0–149.0)
VLDL: 22.8 mg/dL (ref 0.0–40.0)

## 2021-02-20 LAB — COMPREHENSIVE METABOLIC PANEL
ALT: 20 U/L (ref 0–35)
AST: 18 U/L (ref 0–37)
Albumin: 4.3 g/dL (ref 3.5–5.2)
Alkaline Phosphatase: 112 U/L (ref 39–117)
BUN: 8 mg/dL (ref 6–23)
CO2: 26 mEq/L (ref 19–32)
Calcium: 9.9 mg/dL (ref 8.4–10.5)
Chloride: 103 mEq/L (ref 96–112)
Creatinine, Ser: 0.75 mg/dL (ref 0.40–1.20)
GFR: 85.08 mL/min (ref >60.00)
Glucose, Bld: 222 mg/dL — ABNORMAL HIGH (ref 70–99)
Potassium: 4 mEq/L (ref 3.5–5.1)
Sodium: 138 mEq/L (ref 135–145)
Total Bilirubin: 0.4 mg/dL (ref 0.2–1.2)
Total Protein: 7.3 g/dL (ref 6.0–8.3)

## 2021-02-20 MED ORDER — CLARITHROMYCIN 500 MG PO TABS: 1000.0000 mg | ORAL_TABLET | Freq: Once | ORAL | 0 refills | Status: AC

## 2021-02-20 NOTE — Patient Instructions (Addendum)
Give [place] 2-3 business days to get the results of your labs back.   Keep the diet clean and stay active.  Stop the Norvasc/amlodipine and start the losartan. I want your blood pressure <140/90 consistently. If not, send me a message as we will have to go back on the Norvasc/amlodipine.  If your rash does not improve with this single dose of medicine, follow up with your skin doctor.   Let us know if you need anything.

## 2021-02-20 NOTE — Progress Notes (Signed)
Chief Complaint  Patient presents with   Follow-up    Subjective Tiffany Estrada is a 65 y.o. female who presents for hypertension follow up. She does monitor home blood pressures. Blood pressures ranging from 120-130's/80's on average. She is compliant with medications- Norvasc 5 mg/d; has not been taking losartan 100 mg/d . Patient has these side effects of medication: none She is adhering to a healthy diet overall. Current exercise: some walking No Cp or SOB.   Hyperlipidemia Patient presents for hyperlipidemia follow up. Currently being treated with Lipitor 40 mg/d and compliance with treatment thus far has been good. She denies myalgias. Diet/exercise as above.  The patient is not known to have coexisting coronary artery disease.  Rash behind thighs continue. Itches and sometimes scales. Tried steroids and antifungals without relief. Saw derm early on who dx'd it as fungus.    Past Medical History:  Diagnosis Date   Diabetes mellitus type 2 in obese ([place]) 02/14/2016   Essential hypertension 02/04/2016   History of chicken pox    Hyperlipidemia    Neuromuscular disorder ([place])    Obesity 02/04/2016   Tobacco abuse 02/14/2016    Exam BP 120/80   Pulse 75   Temp 98.1 F (36.7 C) (Oral)   Ht 5\' 6"  (1.676 m)   Wt 178 lb 6 oz (80.9 kg)   SpO2 99%   BMI 28.79 kg/m  General:  well developed, well nourished, in no apparent distress Heart: RRR, no bruits, no LE edema Lungs: clear to auscultation, no accessory muscle use Skin: Wood's lamp shows violet purple lesion behind thighs in patches.  Psych: well oriented with normal range of affect and appropriate judgment/insight  Essential hypertension  Dyslipidemia - Plan: Comprehensive metabolic panel, Lipid panel  Skin lesion - Plan: clarithromycin (BIAXIN) 500 MG tablet  Chronic, stable. Stop Norvasc, start losartan for renal protection. Counseled on diet and exercise. Chronic, stable. Cont Lipitor 40 mg/d. Ck  labs. Chronic, unstable. Trial tx for erythrasma w 1 g of Biaxin. Needs to call derm if no improvement.  Flu shot today.  F/u in 6 mo or prn. The patient voiced understanding and agreement to the plan.  [place], DO 02/20/21  11:21 AM

## 2021-02-26 ENCOUNTER — Telehealth: Payer: Self-pay | Admitting: Internal Medicine

## 2021-02-26 NOTE — Telephone Encounter (Signed)
PT dropped off document to be filled out by provider (11 pages - Patient Assistance Program Aplication) Pt would like document to be mailed out or it can be faxed to [phone]. Document given to CMA.

## 2021-02-27 ENCOUNTER — Other Ambulatory Visit: Payer: Self-pay | Admitting: Internal Medicine

## 2021-02-27 MED ORDER — INSULIN LISPRO PROT & LISPRO (75-25 MIX) 100 UNIT/ML KWIKPEN: PEN_INJECTOR | SUBCUTANEOUS | 11 refills | Status: DC

## 2021-02-27 NOTE — Telephone Encounter (Signed)
Documents have been received. Patient will bring income information back and it will be faxed.

## 2021-03-01 ENCOUNTER — Other Ambulatory Visit: Payer: Self-pay | Admitting: Family Medicine

## 2021-03-01 DIAGNOSIS — Z794 Long term (current) use of insulin: Secondary | ICD-10-CM

## 2021-03-01 DIAGNOSIS — E1165 Type 2 diabetes mellitus with hyperglycemia: Secondary | ICD-10-CM

## 2021-03-01 DIAGNOSIS — I10 Essential (primary) hypertension: Secondary | ICD-10-CM

## 2021-03-21 ENCOUNTER — Telehealth: Payer: Self-pay | Admitting: Family Medicine

## 2021-03-21 NOTE — Telephone Encounter (Signed)
Pt. Called in and stated that rash that she was previously treated for is now spreading. It started at her legs and has now spread upward to her back and continues to spread. Please advise

## 2021-03-21 NOTE — Telephone Encounter (Signed)
Spoke with patient she stated that this rash is similar to the one at last visit in October.  I asked if did the medications help at all and she stated it started to fade a little.  Now rash is spreading.  We did not have any openings today but advised to call dermatology as well now.  Any recommendations if derm is unable to get her in today?

## 2021-03-21 NOTE — Telephone Encounter (Signed)
Patient has appointment with dermatology [facility]), with Dr. Tamala Julian today at 350pm.

## 2021-03-21 NOTE — Telephone Encounter (Signed)
Oral antihistamine like Zyrtec or Xyzal, otc hydrocortisone cream, follow up w derm.

## 2021-04-30 ENCOUNTER — Ambulatory Visit: Payer: BC Managed Care – PPO | Admitting: Family Medicine

## 2021-05-02 ENCOUNTER — Ambulatory Visit: Payer: BC Managed Care – PPO | Admitting: Family Medicine

## 2021-06-02 IMAGING — MG DIGITAL SCREENING BILAT W/ TOMO W/ CAD
8 series · 8 of 24 positions shown · non-contrast
Comparison: Previous exam(s).

CLINICAL DATA: Screening.

EXAM:
DIGITAL SCREENING BILATERAL MAMMOGRAM WITH TOMO AND CAD

[L CC synth-2D]
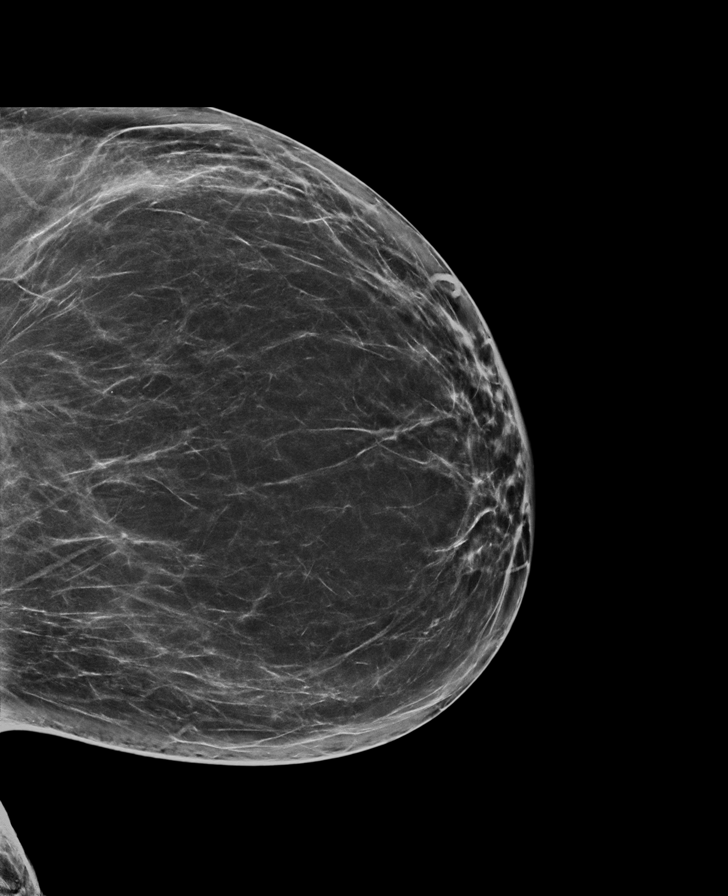

[R MLO synth-2D]
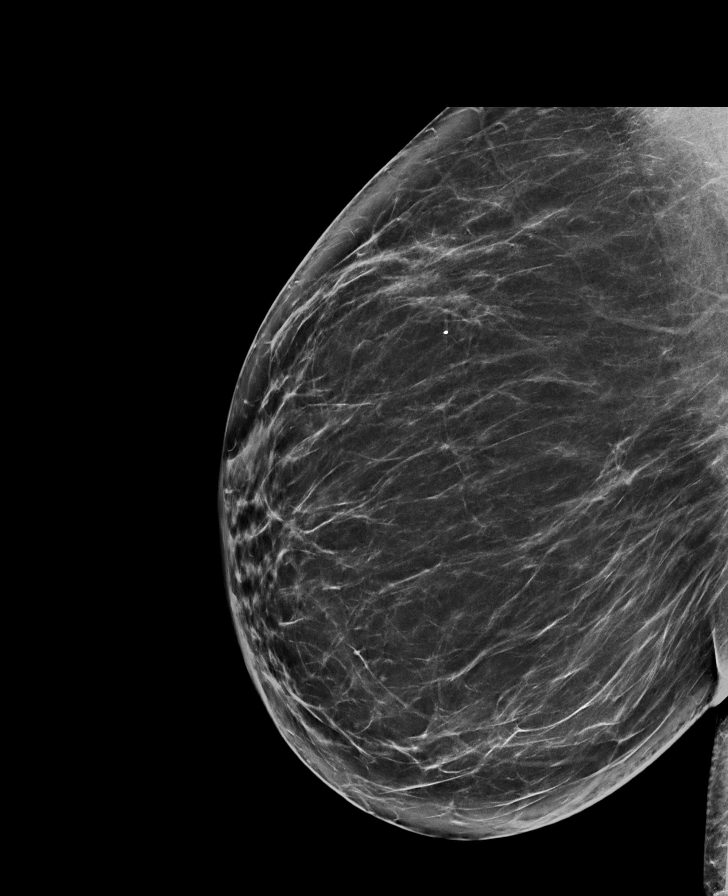

[R CC synth-2D]
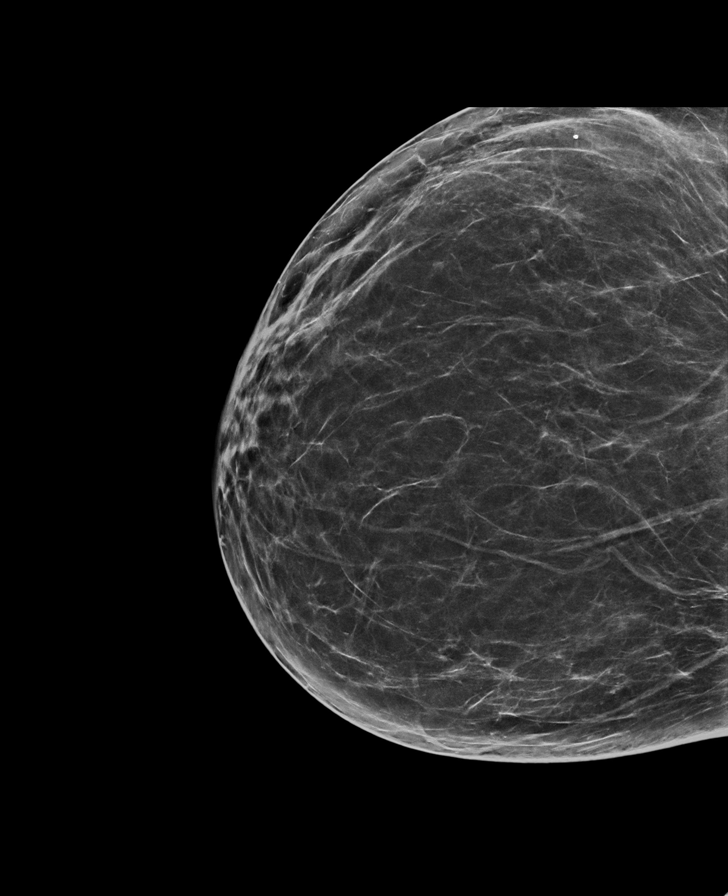

[L MLO synth-2D]
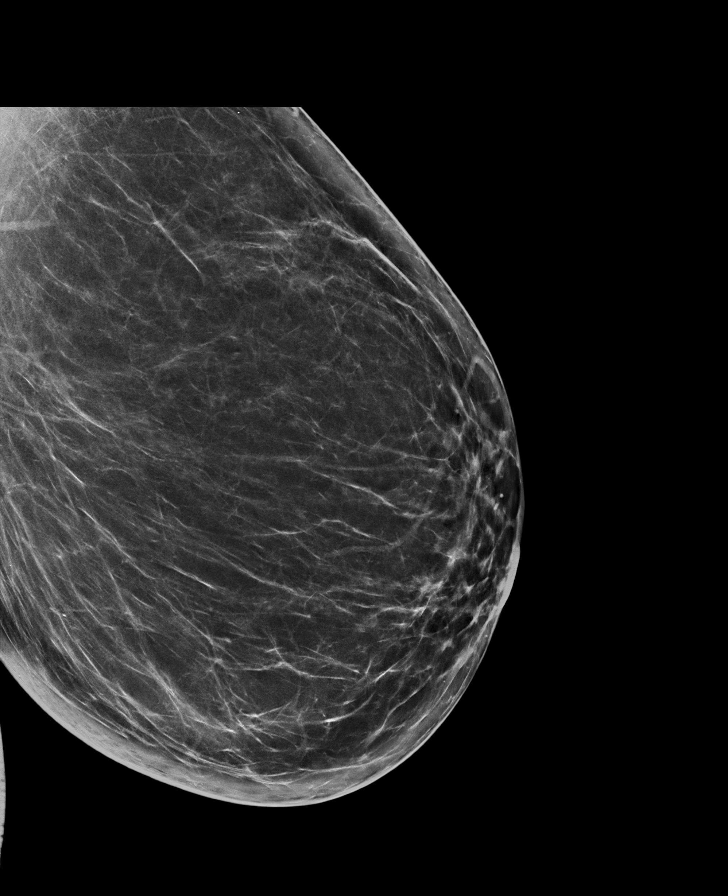

[R CC tomo · tomo slice 38/75.0]
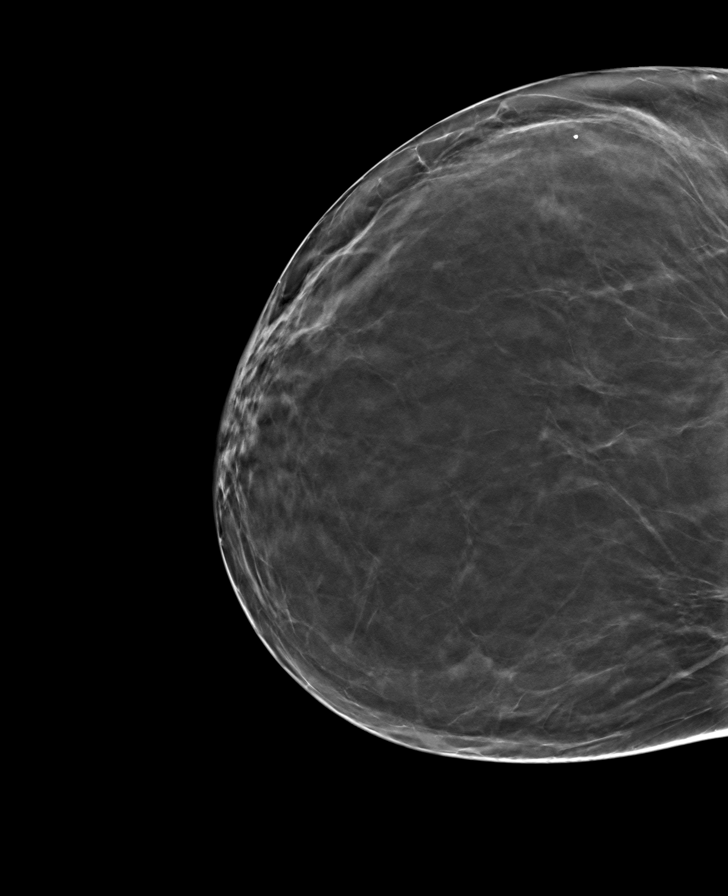

[R MLO tomo · tomo slice 41/82.0]
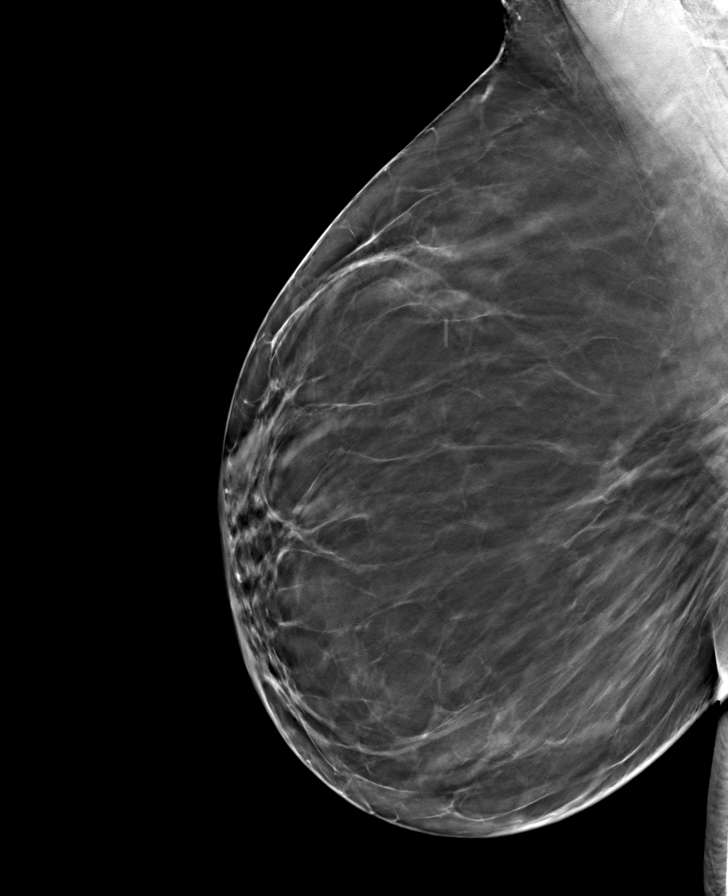

[L CC tomo · tomo slice 40/79.0]
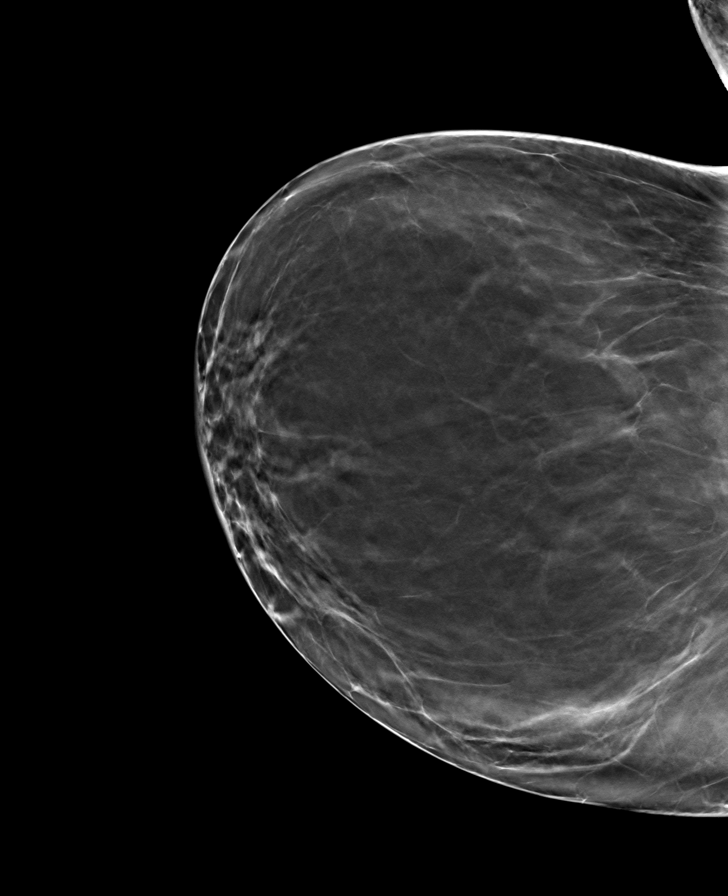

[L MLO tomo · tomo slice 41/82.0]
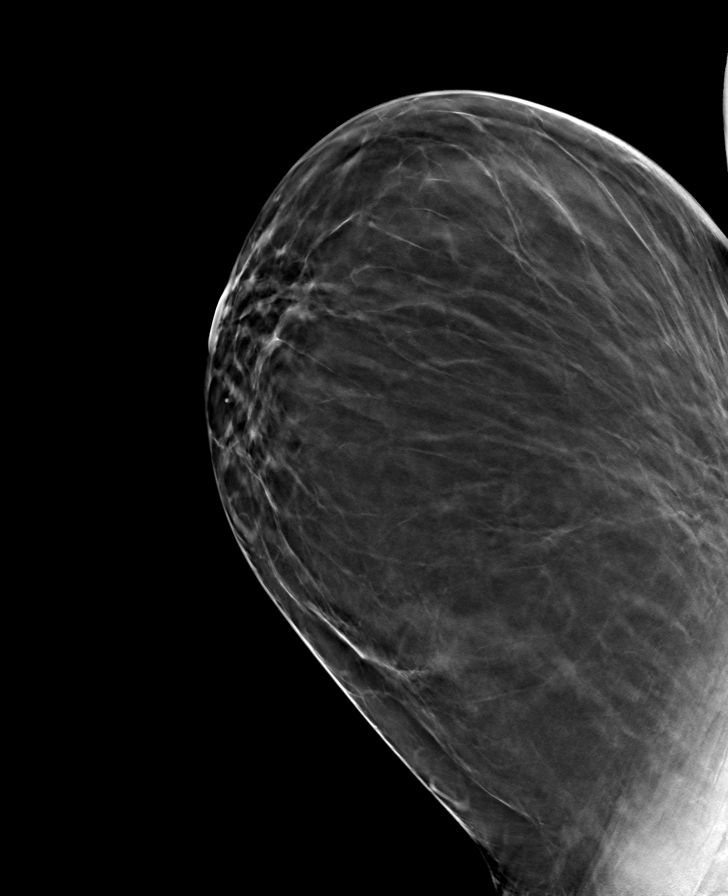

[8 of 24 positions shown; findings below may reference images not displayed]

ACR Breast Density Category b: There are scattered areas of
fibroglandular density.
FINDINGS: There are no findings suspicious for malignancy. Images were
processed with CAD.
IMPRESSION: No mammographic evidence of malignancy. A result letter of this
screening mammogram will be mailed directly to the patient.

RECOMMENDATION:
Screening mammogram in one year. (Code:[id number])

BI-RADS CATEGORY  1: Negative.

## 2021-06-07 ENCOUNTER — Other Ambulatory Visit: Payer: Self-pay | Admitting: Family Medicine

## 2021-06-07 MED ORDER — ONETOUCH DELICA LANCETS 33G MISC: 3 refills | Status: AC

## 2021-06-18 ENCOUNTER — Ambulatory Visit: Payer: Self-pay | Admitting: Internal Medicine

## 2021-06-25 ENCOUNTER — Ambulatory Visit (INDEPENDENT_AMBULATORY_CARE_PROVIDER_SITE_OTHER): Payer: BC Managed Care – PPO | Admitting: Internal Medicine

## 2021-06-25 ENCOUNTER — Ambulatory Visit: Payer: Self-pay | Admitting: Internal Medicine

## 2021-06-25 ENCOUNTER — Encounter: Payer: Self-pay | Admitting: Internal Medicine

## 2021-06-25 VITALS — BP 140/90 | HR 93 | Ht 66.0 in | Wt 176.0 lb

## 2021-06-25 DIAGNOSIS — E1142 Type 2 diabetes mellitus with diabetic polyneuropathy: Secondary | ICD-10-CM | POA: Diagnosis not present

## 2021-06-25 LAB — POCT GLYCOSYLATED HEMOGLOBIN (HGB A1C): Hemoglobin A1C: 6.2 % — AB (ref 4.0–5.6)

## 2021-06-25 MED ORDER — DAPAGLIFLOZIN PROPANEDIOL 10 MG PO TABS: 10.0000 mg | ORAL_TABLET | Freq: Every day | ORAL | 3 refills | Status: DC

## 2021-06-25 MED ORDER — TRULICITY 1.5 MG/0.5ML ~~LOC~~ SOAJ: 1.5000 mg | SUBCUTANEOUS | 6 refills | Status: DC

## 2021-06-25 MED ORDER — TRESIBA FLEXTOUCH 100 UNIT/ML ~~LOC~~ SOPN: 24.0000 [IU] | PEN_INJECTOR | Freq: Every day | SUBCUTANEOUS | 3 refills | Status: DC

## 2021-06-25 MED ORDER — METFORMIN HCL 500 MG PO TABS: 500.0000 mg | ORAL_TABLET | Freq: Two times a day (BID) | ORAL | 3 refills | Status: DC

## 2021-06-25 NOTE — Patient Instructions (Addendum)
-   Start Tresiba 24 units once daily  - Stop  Novolog/ Humalog  Mix  - Continue Metformin 500 mg, 1 tablet daily  - Continue Farxiga 10 mg , 1 tablet in the morning  - Continue Trulicity 1.5 mg  weekly       -HOW TO TREAT LOW BLOOD SUGARS (Blood sugar LESS THAN 70 MG/DL) Please follow the RULE OF 15 for the treatment of hypoglycemia treatment (when your (blood sugars are less than 70 mg/dL)   STEP 1: Take 15 grams of carbohydrates when your blood sugar is low, which includes:  3-4 GLUCOSE TABS  OR 3-4 OZ OF JUICE OR REGULAR SODA OR ONE TUBE OF GLUCOSE GEL    STEP 2: RECHECK blood sugar in 15 MINUTES STEP 3: If your blood sugar is still low at the 15 minute recheck --> then, go back to STEP 1 and treat AGAIN with another 15 grams of carbohydrates.

## 2021-06-25 NOTE — Progress Notes (Signed)
Name: Tiffany Estrada  Age/ Sex: 66 y.o., female   MRN/ DOB: [id number], Sep 28, 1955     PCP: Tiffany Pal, DO   Reason for Endocrinology Evaluation: Type 2 Diabetes Mellitus  Initial Endocrine Consultative Visit: 10/18/2019    PATIENT IDENTIFIER: Tiffany Estrada is a 66 y.o. female with a past medical history of T2DM, HTN and Dyslipidemia. The patient has followed with Endocrinology clinic since 10/18/2019 for consultative assistance with management of her diabetes.  DIABETIC HISTORY:  Tiffany Estrada was diagnosed with P8EU in 2353, trulicity has been ineffective.  Her hemoglobin A1c has ranged from 7.5% in 2018, peaking at 11.9% in 2020.  On her initial visit to our clinic she had an A1c of 11.2%   , she was on Tresiba, and Metformin. We continued metformin , switched basal insulin to insulin Mix.   Trulicity started 10/1441  [name] through PCP in 10/2020   SUBJECTIVE:   During the last visit (02/19/2021): A1c 7.7 %. We continued Trulicity,insulin and  Metformin      Today (06/25/2021): Tiffany Estrada is here for a follow up on diabetes management.  She checks her blood sugars occasionally . The patient has not had hypoglycemic episodes since the last clinic visit.  She is S/P carpal tunnel release and ulnar nerve decompression at the right wrist 01/22/2021 Was seen at [facility] for neck and shoulder pain 06/11/2021   She stopped taking evening insulin   Saw dermatology for tinea  Saw podiatry for callous formation   HOME DIABETES REGIMEN:  Metformin 500 mg daily Novolog Mix 26 units QAM and 24 units QPM Trulicity 1.5 mg weekly ( Wednesday)  Farxiga 10 mg daily       Statin: yes ACE-I/ARB:  losartan   METER DOWNLOAD SUMMARY: 2/8-2/21/2023 Fingerstick Blood Glucose Tests = 4 Overall Mean FS Glucose = 186 Standard Deviation = 29  BG Ranges: Low = 145 High = 210  Hypoglycemic Events/30 Days: BG < 50 = 0 Episodes of symptomatic severe hypoglycemia =  0    DIABETIC COMPLICATIONS: Microvascular complications:  Neuropathy Denies: CKD , retinopathy Last Eye Exam: Completed 09/03/2019  Macrovascular complications:   Denies: CAD, CVA, PVD   HISTORY:  Past Medical History:  Past Medical History:  Diagnosis Date   Diabetes mellitus type 2 in obese ([place]) 02/14/2016   Essential hypertension 02/04/2016   History of chicken pox    Hyperlipidemia    Neuromuscular disorder ([place])    Obesity 02/04/2016   Tobacco abuse 02/14/2016   Past Surgical History:  Past Surgical History:  Procedure Laterality Date   ABDOMINAL HYSTERECTOMY     Fibroids   APPENDECTOMY     CHOLECYSTECTOMY     COLONOSCOPY  over 10 years ago   in [place]-normal exam   Social History:  reports that she has been smoking cigarettes. She has been smoking an average of .25 packs per day. She has never used smokeless tobacco. She reports that she does not drink alcohol and does not use drugs. Family History:  Family History  Problem Relation Age of Onset   Pancreatitis Mother    Diabetes Mother    Pancreatitis Sister    Pancreatitis Maternal Aunt    Pancreatitis Maternal Uncle    Colon cancer Neg Hx    Colon polyps Neg Hx    Esophageal cancer Neg Hx    Rectal cancer Neg Hx    Stomach cancer Neg Hx      HOME MEDICATIONS: Allergies as  of 06/25/2021       Reactions   Latex    Penicillins    Red Dye    Shellfish Allergy    Sulfa Antibiotics         Medication List        Accurate as of June 25, 2021 11:19 AM. If you have any questions, ask your nurse or doctor.          acetaminophen 500 MG tablet Commonly known as: TYLENOL Take 2 tablets (1,000 mg total) by mouth every 6 (six) hours as needed.   Alcohol Swabs Pads Use as directed   atorvastatin 40 MG tablet Commonly known as: LIPITOR Take 1 tablet (40 mg total) by mouth daily.   cholecalciferol 25 MCG (1000 UNIT) tablet Commonly known as: VITAMIN D3 Take 1,000 Units by mouth  daily.   dapagliflozin propanediol 10 MG Tabs tablet Commonly known as: FARXIGA Take 1 tablet (10 mg total) by mouth daily before breakfast.   Dexcom G6 Sensor Misc 1 Device by Does not apply route as directed.   Dexcom G6 Transmitter Misc 1 Device by Does not apply route as directed.   gabapentin 300 MG capsule Commonly known as: NEURONTIN   Insulin Lispro Prot & Lispro (75-25) 100 UNIT/ML Kwikpen Commonly known as: HumaLOG Mix 75/25 KwikPen Max daily 50 units   levocetirizine 5 MG tablet Commonly known as: XYZAL Take 1 tablet (5 mg total) by mouth every evening.   losartan 100 MG tablet Commonly known as: COZAAR Take 1 tablet by mouth once daily   metFORMIN 500 MG tablet Commonly known as: GLUCOPHAGE TAKE 1 TABLET BY MOUTH TWICE DAILY WITH MEALS   MULTI ADULT GUMMIES PO Take by mouth. Take 1 gummies by mouth daily.   NovoLOG Mix 70/30 FlexPen (70-30) 100 UNIT/ML FlexPen Generic drug: insulin aspart protamine - aspart Inject 0.24 mLs (24 Units total) into the skin 2 (two) times daily with a meal.   Omega-3 1000 MG Caps Take by mouth.   OneTouch Delica Lancets Fine Misc Use to check sugars twice weekly.   OneTouch Delica Lancets 20E Misc Use twice daily to check blood sugar.  DXE11.9   OneTouch Verio test strip Generic drug: glucose blood USE 1 STRIP TO CHECK GLUCOSE TWICE A WEEK   Pen Needles 32G X 4 MM Misc 1 Device by Does not apply route as directed.   Tyler Aas FlexTouch 100 UNIT/ML FlexTouch Pen Generic drug: insulin degludec Inject 24 Units into the skin daily. Started by: Dorita Sciara, MD   Trulicity 1.5 BX/4.3HW Sopn Generic drug: Dulaglutide Inject 1.5 mg into the skin once a week.         OBJECTIVE:   Vital Signs: BP 140/90 (BP Location: Left Arm, Patient Position: Sitting, Cuff Size: Small)    Pulse 93    Ht 5\' 6"  (1.676 m)    Wt 176 lb (79.8 kg)    SpO2 96%    BMI 28.41 kg/m   Wt Readings from Last 3 Encounters:  06/25/21 176  lb (79.8 kg)  02/20/21 178 lb 6 oz (80.9 kg)  02/19/21 177 lb (80.3 kg)     Exam: General: Pt appears well and is in NAD  Lungs: Clear with good BS bilat with no rales, rhonchi, or wheezes  Heart: RRR with normal S1 and S2 and no gallops; no murmurs; no rub  Extremities: No pretibial edema   Neuro: MS is good with appropriate affect, pt is alert and Ox3   DM  Foot Exam 06/25/2021   The skin of the feet is intact without sores or ulcerations.callous formation of the 5th toes , Has a right foot lateral skin lesion  The pedal pulses are 2+ on right and 2+ on left. The sensation is decreased to a screening 5.07, 10 gram monofilament bilaterally    DATA REVIEWED:  Lab Results  Component Value Date   HGBA1C 6.2 (A) 06/25/2021   HGBA1C 7.7 (A) 02/19/2021   HGBA1C 7.7 (A) 12/25/2020   Lab Results  Component Value Date   MICROALBUR 1.6 02/25/2019   LDLCALC 108 (H) 02/20/2021   CREATININE 0.75 02/20/2021   Lab Results  Component Value Date   MICRALBCREAT 2.3 02/25/2019     Lab Results  Component Value Date   CHOL 179 02/20/2021   HDL 47.90 02/20/2021   LDLCALC 108 (H) 02/20/2021   TRIG 114.0 02/20/2021   CHOLHDL 4 02/20/2021         ASSESSMENT / PLAN / RECOMMENDATIONS:   1) Type 2 Diabetes Mellitus, Optimally  controlled, With neuropathic  complications - Most recent A1c of 6.2 %. Goal A1c <7.0 %.     -Praised the pt on improved glycemic control. I suspect part of this is due to lack of glucocorticoid injections that she was receiving prior to wrist sx.  - Due to hypoglycemia she stopped using 2nd dose of insulin, hence hyperglycemia in the morning with BG >200 mg.dL  - I am going to switch insulin mix to basal insulin as below     MEDICATIONS: Stop Insulin mix  Start Tresiba 24 units daily  Continue Metformin 500 mg daily  Continue Trulicity 1.5  mg weekly  Continue Farxiga 10 mg daily     EDUCATION / INSTRUCTIONS: BG monitoring instructions: Patient is  instructed to check her blood sugars 2 times a day, before breakfast and supper . Call [facility] if: BG persistently < 70 I reviewed the Rule of 15 for the treatment of hypoglycemia in detail with the patient. Literature supplied.   2) Diabetic complications:  Eye: Does not have known diabetic retinopathy.  Neuro/ Feet: Does have known diabetic peripheral neuropathy .  Renal: Patient does not  have known baseline CKD. She   is not on an ACEI/ARB at present.     F/U in 4 months     Signed electronically by: Mack Guise, MD  [facility]  [facility] [address] [place], [place] [address] Phone: [phone] FAX: [phone]   CC: Tiffany Estrada, [address] [place] [address] Phone: [phone]  Fax: [phone]  Return to Endocrinology clinic as below: No future appointments.

## 2021-06-26 ENCOUNTER — Encounter: Payer: Self-pay | Admitting: Internal Medicine

## 2021-07-04 ENCOUNTER — Other Ambulatory Visit: Payer: Self-pay

## 2021-07-04 ENCOUNTER — Telehealth: Payer: Self-pay

## 2021-07-04 MED ORDER — ONETOUCH VERIO FLEX SYSTEM W/DEVICE KIT: PACK | 0 refills | Status: AC

## 2021-07-04 NOTE — Telephone Encounter (Signed)
Spoke w/ Pt- informed I am sending over a new One Touch Verio Flex system for her. Pt verbalized understanding.  ?

## 2021-07-04 NOTE — Telephone Encounter (Signed)
Caller Name Nihira Puello ?Caller Phone Number [phone] ?Patient Name Tiffany Estrada ?Patient DOB Jun 13, 1955 ?Call Type Message Only Information Provided ?Reason for Call Request for General Office Information ?Initial Comment Caller states she is calling because she wants to talk to the nurse. She needs another blood ?glucose monitoring system. She is not sure if she left her last one at the office. It is a one touch ?viro flex monitoring system. ?Additional Comment Office hours provided. Caller wanted to send a message. ?Disp. Time Disposition Final User ?07/04/2021 12:22:13 PM General Information Provided Yes Susette Racer ?Call Closed By: Susette Racer ?Transaction Date/Time: 07/04/2021 12:19:35 PM (ET) ?

## 2021-07-04 NOTE — Telephone Encounter (Signed)
Meter was sent by pcp office  ?

## 2021-08-21 ENCOUNTER — Telehealth: Payer: Self-pay

## 2021-08-21 NOTE — Telephone Encounter (Signed)
Patient want have any insurance until June. She is unable to afford any of her medications. Patient states will reach out to [employer] and [employer] to see if she needs to fill out a new application or if her is still valid.  ?

## 2021-08-21 NOTE — Telephone Encounter (Signed)
Patient notified and will go pick up the [place]  ?

## 2021-08-23 ENCOUNTER — Telehealth: Payer: Self-pay

## 2021-08-23 NOTE — Telephone Encounter (Signed)
Patient states that she was unable to get through to the patient assistance company and that she can't afford the [place] or the [place].  ?

## 2021-08-26 NOTE — Telephone Encounter (Signed)
Patient notified and verbalized understanding. 

## 2021-10-15 ENCOUNTER — Ambulatory Visit (INDEPENDENT_AMBULATORY_CARE_PROVIDER_SITE_OTHER): Payer: Medicare Other | Admitting: Family Medicine

## 2021-10-15 ENCOUNTER — Telehealth: Payer: Self-pay

## 2021-10-15 ENCOUNTER — Encounter: Payer: Self-pay | Admitting: Family Medicine

## 2021-10-15 VITALS — BP 142/80 | HR 83 | Temp 97.9°F | Ht 66.0 in | Wt 177.0 lb

## 2021-10-15 DIAGNOSIS — I10 Essential (primary) hypertension: Secondary | ICD-10-CM | POA: Diagnosis not present

## 2021-10-15 DIAGNOSIS — E1142 Type 2 diabetes mellitus with diabetic polyneuropathy: Secondary | ICD-10-CM

## 2021-10-15 DIAGNOSIS — Z9189 Other specified personal risk factors, not elsewhere classified: Secondary | ICD-10-CM | POA: Insufficient documentation

## 2021-10-15 LAB — COMPREHENSIVE METABOLIC PANEL
ALT: 23 U/L (ref 0–35)
AST: 18 U/L (ref 0–37)
Albumin: 4.4 g/dL (ref 3.5–5.2)
Alkaline Phosphatase: 125 U/L — ABNORMAL HIGH (ref 39–117)
BUN: 9 mg/dL (ref 6–23)
CO2: 25 mEq/L (ref 19–32)
Calcium: 10.1 mg/dL (ref 8.4–10.5)
Chloride: 98 mEq/L (ref 96–112)
Creatinine, Ser: 0.7 mg/dL (ref 0.40–1.20)
GFR: 92 mL/min (ref >60.00)
Glucose, Bld: 315 mg/dL — ABNORMAL HIGH (ref 70–99)
Potassium: 3.7 mEq/L (ref 3.5–5.1)
Sodium: 134 mEq/L — ABNORMAL LOW (ref 135–145)
Total Bilirubin: 0.7 mg/dL (ref 0.2–1.2)
Total Protein: 7.7 g/dL (ref 6.0–8.3)

## 2021-10-15 LAB — LIPID PANEL
Cholesterol: 194 mg/dL (ref 0–200)
HDL: 49.3 mg/dL (ref >39.00)
LDL Cholesterol: 121 mg/dL — ABNORMAL HIGH (ref 0–99)
NonHDL: 145.02
Total CHOL/HDL Ratio: 4
Triglycerides: 122 mg/dL (ref 0.0–149.0)
VLDL: 24.4 mg/dL (ref 0.0–40.0)

## 2021-10-15 MED ORDER — TRESIBA FLEXTOUCH 100 UNIT/ML ~~LOC~~ SOPN: 24.0000 [IU] | PEN_INJECTOR | Freq: Every day | SUBCUTANEOUS | 3 refills | Status: DC

## 2021-10-15 MED ORDER — ATORVASTATIN CALCIUM 40 MG PO TABS: 40.0000 mg | ORAL_TABLET | Freq: Every day | ORAL | 1 refills | Status: DC

## 2021-10-15 MED ORDER — OLMESARTAN MEDOXOMIL 20 MG PO TABS: 20.0000 mg | ORAL_TABLET | Freq: Every day | ORAL | 2 refills | Status: DC

## 2021-10-15 NOTE — Patient Instructions (Addendum)
Tresiba 24 units daily  Continue Metformin 500 mg daily  Continue Trulicity 1.5  mg weekly  Continue Farxiga 10 mg daily   Go back on the Lipitor. We are going on a new medication for your blood pressure. Let me know if there are cost issues.   Keep the diet clean and stay active.  Around 3 times per week, check your blood pressure 4 times per day. Twice in the morning and twice in the evening. The readings should be at least one minute apart. Write down these values and bring them to your next nurse visit/appointment.  When you check your BP, make sure you have been doing something calm/relaxing 5 minutes prior to checking. Both feet should be flat on the floor and you should be sitting. Use your left arm and make sure it is in a relaxed position (on a table), and that the cuff is at the approximate level/height of your heart.  Call [facility] [place] [facility] at [phone] for an appointment.  They are located at [address], [place], [place], [address] (right across the hall from our office).  Let us know if you need anything.

## 2021-10-15 NOTE — Telephone Encounter (Signed)
Patient will drop off medical paperwork that needs to be filled out for jury duty in regards to her having diabetes. Patient is aware that you are out of office until Monday.

## 2021-10-15 NOTE — Progress Notes (Addendum)
Chief Complaint  Patient presents with   Follow-up    Subjective Tiffany Estrada is a 66 y.o. female who presents for hypertension follow up. She does monitor home blood pressures. Blood pressures ranging from 150's/100's on this past week. She is not compliant with medications. Patient has these side effects of medication: none She is usually adhering to a healthy diet overall. Current exercise: some walking, cycling No CP or SOB.   Hyperlipidemia Patient presents for dyslipidemia follow up. Currently being treated with Lipitor 40 mg daily and compliance with treatment thus far has been poor since she stopped taking it. Diet and exercise as above. The patient is not known to have coexisting coronary artery disease.   Past Medical History:  Diagnosis Date   Diabetes mellitus type 2 in obese ([place]) 02/14/2016   Essential hypertension 02/04/2016   History of chicken pox    Hyperlipidemia    Neuromuscular disorder ([place])    Obesity 02/04/2016   Tobacco abuse 02/14/2016    Exam BP (!) 142/80   Pulse 83   Temp 97.9 F (36.6 C) (Oral)   Ht '5\' 6"'$  (1.676 m)   Wt 177 lb (80.3 kg)   SpO2 98%   BMI 28.57 kg/m  General:  well developed, well nourished, in no apparent distress Heart: RRR, no bruits, no LE edema Lungs: clear to auscultation, no accessory muscle use Psych: well oriented with normal range of affect and appropriate judgment/insight  Essential hypertension - Plan: olmesartan (BENICAR) 20 MG tablet  10 year risk of MI or stroke 7.5% or greater - Plan: atorvastatin (LIPITOR) 40 MG tablet, Comprehensive metabolic panel  Type 2 diabetes mellitus with diabetic polyneuropathy, without long-term current use of insulin (HCC) - Plan: insulin degludec (TRESIBA FLEXTOUCH) 100 UNIT/ML FlexTouch Pen, Lipid panel  Chronic, uncontrolled. Started olmesartan 20 mg/d. Will reck labs at f/u. Monitor BP at home. Counseled on diet and exercise. Chronic, uncontrolled. Restart Lipitor 40  mg/d. Ck labs today.  For her diabetes, she has not been taking her medication due to cost.  I recommended she contact the medication company to see about patient assistance.  She will let me or her endocrinologist know if she has issues. F/u in 2 weeks. The patient voiced understanding and agreement to the plan.  [place], DO 10/15/21  9:30 AM

## 2021-10-16 NOTE — Telephone Encounter (Signed)
Patient dropped off medical paperwork for jury duty

## 2021-10-18 ENCOUNTER — Telehealth: Payer: Self-pay

## 2021-10-18 NOTE — Telephone Encounter (Signed)
Would rec calling the manufacturer for patient assistance. Alternatively, Dr. Chauncey Cruel will be back Mon.

## 2021-10-18 NOTE — Telephone Encounter (Signed)
Pt called- Trulicity, Tyler Aas too expensive. Wants to know what to do instead. She states Dr. Kelton Pillar is out of the office until next week and she will be out?

## 2021-10-21 NOTE — Telephone Encounter (Signed)
Called the patient informed of PCP instructions. She had communicated with her Endo already and problem taken care of.

## 2021-10-21 NOTE — Telephone Encounter (Signed)
Called left msg to call back. 

## 2021-10-24 ENCOUNTER — Telehealth: Payer: Self-pay

## 2021-10-24 NOTE — Telephone Encounter (Signed)
Sent mychart message to schedule AWV

## 2021-10-28 ENCOUNTER — Telehealth: Payer: Self-pay

## 2021-11-04 ENCOUNTER — Ambulatory Visit: Payer: Medicare Other | Admitting: Family Medicine

## 2021-11-12 ENCOUNTER — Ambulatory Visit (INDEPENDENT_AMBULATORY_CARE_PROVIDER_SITE_OTHER): Payer: Medicare Other | Admitting: Family Medicine

## 2021-11-12 ENCOUNTER — Encounter: Payer: Self-pay | Admitting: Family Medicine

## 2021-11-12 VITALS — BP 142/86 | HR 71 | Temp 98.6°F | Ht 66.0 in | Wt 177.2 lb

## 2021-11-12 DIAGNOSIS — E1142 Type 2 diabetes mellitus with diabetic polyneuropathy: Secondary | ICD-10-CM

## 2021-11-12 DIAGNOSIS — I10 Essential (primary) hypertension: Secondary | ICD-10-CM

## 2021-11-12 LAB — BASIC METABOLIC PANEL
BUN: 11 mg/dL (ref 6–23)
CO2: 24 mEq/L (ref 19–32)
Calcium: 9.8 mg/dL (ref 8.4–10.5)
Chloride: 101 mEq/L (ref 96–112)
Creatinine, Ser: 0.67 mg/dL (ref 0.40–1.20)
GFR: 92.93 mL/min (ref >60.00)
Glucose, Bld: 270 mg/dL — ABNORMAL HIGH (ref 70–99)
Potassium: 3.5 mEq/L (ref 3.5–5.1)
Sodium: 135 mEq/L (ref 135–145)

## 2021-11-12 MED ORDER — NOVOLIN 70/30 (70-30) 100 UNIT/ML ~~LOC~~ SUSP: 16.0000 [IU] | Freq: Two times a day (BID) | SUBCUTANEOUS | 11 refills | Status: DC

## 2021-11-12 MED ORDER — TRESIBA FLEXTOUCH 100 UNIT/ML ~~LOC~~ SOPN: 30.0000 [IU] | PEN_INJECTOR | Freq: Every day | SUBCUTANEOUS | 3 refills | Status: DC

## 2021-11-12 MED ORDER — AMLODIPINE-OLMESARTAN 5-20 MG PO TABS: 1.0000 | ORAL_TABLET | Freq: Every day | ORAL | 2 refills | Status: DC

## 2021-11-12 NOTE — Progress Notes (Signed)
Chief Complaint  Patient presents with   Follow-up    Subjective Tiffany Estrada is a 66 y.o. female who presents for hypertension follow up. She does monitor home blood pressures. Blood pressures ranging from 140-150's/100's on average. She is compliant with medication- Benicar 20 mg/d. Patient has these side effects of medication: maybe looser stools She is sometimes adhering to a healthy diet overall. Current exercise: walking No Cp or SOB.   DM Hx of DM, she is waiting on an appt with her endo. She is currently taking 12 u bid of 70/30 Novolin 2/2 insurance issues. Sugars running in high 200's, low 300's. No low's. Diet/exercise as above.    Past Medical History:  Diagnosis Date   Diabetes mellitus type 2 in obese ([place]) 02/14/2016   Essential hypertension 02/04/2016   History of chicken pox    Hyperlipidemia    Neuromuscular disorder ([place])    Obesity 02/04/2016   Tobacco abuse 02/14/2016    Exam BP (!) 142/86   Pulse 71   Temp 98.6 F (37 C) (Oral)   Ht '5\' 6"'$  (1.676 m)   Wt 177 lb 4 oz (80.4 kg)   SpO2 96%   BMI 28.61 kg/m  General:  well developed, well nourished, in no apparent distress Heart: RRR, no bruits, no LE edema Lungs: clear to auscultation, no accessory muscle use Psych: well oriented with normal range of affect and appropriate judgment/insight  Essential hypertension - Plan: amLODipine-olmesartan (AZOR) 5-20 MG tablet, Basic metabolic panel  Type 2 diabetes mellitus with diabetic polyneuropathy, without long-term current use of insulin (HCC) - Plan: DISCONTINUED: insulin degludec (TRESIBA FLEXTOUCH) 100 UNIT/ML FlexTouch Pen  Chronic, uncontrolled. Add amlodipine 5 mg/d to olmesartan 20 mg/d, f/u on BMP today. Counseled on diet and exercise. Chronic, uncontrolled. Increase 70/30 from 12 u bid to 16 u bid. F/u w endo.  F/u in 1 mo. The patient voiced understanding and agreement to the plan.  [place], DO 11/12/21  11:45 AM

## 2021-11-12 NOTE — Patient Instructions (Addendum)
Keep the diet clean and stay active.  Keep checking your blood pressure at home.  Please sched an appointment with your endocrinologist.  Give [place] 2-3 business days to get the results of your labs back.   Let us know if you need anything.

## 2021-12-13 ENCOUNTER — Encounter: Payer: Self-pay | Admitting: Family Medicine

## 2021-12-13 ENCOUNTER — Ambulatory Visit (INDEPENDENT_AMBULATORY_CARE_PROVIDER_SITE_OTHER): Payer: Medicare Other | Admitting: Family Medicine

## 2021-12-13 VITALS — BP 148/88 | HR 84 | Temp 98.2°F | Ht 66.0 in | Wt 177.1 lb

## 2021-12-13 DIAGNOSIS — E1142 Type 2 diabetes mellitus with diabetic polyneuropathy: Secondary | ICD-10-CM

## 2021-12-13 DIAGNOSIS — I10 Essential (primary) hypertension: Secondary | ICD-10-CM

## 2021-12-13 MED ORDER — LOSARTAN POTASSIUM-HCTZ 100-25 MG PO TABS: 1.0000 | ORAL_TABLET | Freq: Every day | ORAL | 2 refills | Status: DC

## 2021-12-13 MED ORDER — NOVOLIN 70/30 (70-30) 100 UNIT/ML ~~LOC~~ SUSP: 16.0000 [IU] | Freq: Two times a day (BID) | SUBCUTANEOUS | 1 refills | Status: DC

## 2021-12-13 MED ORDER — PIOGLITAZONE HCL 30 MG PO TABS: 30.0000 mg | ORAL_TABLET | Freq: Every day | ORAL | 2 refills | Status: DC

## 2021-12-13 MED ORDER — AMLODIPINE BESYLATE 5 MG PO TABS: 5.0000 mg | ORAL_TABLET | Freq: Every day | ORAL | 3 refills | Status: DC

## 2021-12-13 NOTE — Patient Instructions (Addendum)
Stay on the 16 units twice daily of the Novolin.  Keep checking your sugars and blood pressure at home.   Keep the diet clean and stay active.  Aim to do some physical exertion for 150 minutes per week. This is typically divided into 5 days per week, 30 minutes per day. The activity should be enough to get your heart rate up. Anything is better than nothing if you have time constraints.  Let us know if you need anything.

## 2021-12-13 NOTE — Progress Notes (Signed)
Chief Complaint  Patient presents with   Follow-up    Blood sugar    Subjective Tiffany Estrada is a 66 y.o. female who presents for hypertension follow up. She does monitor home blood pressures. Blood pressures ranging from 140-150's/90's on average. She is compliant with medications- Azor 5-20 mg/d Patient has these side effects of medication: none She is sometimes adhering to a healthy diet overall. Current exercise: walking No Cp or SOB.  DM II Sugars running in mid 200's from high 200/low 300's since increasing 70/30 to 16 u bid. No lows. Taking metformin 500 mg bid also. Ins changed and stopped Trulicity and [place] 2/2 cost. Had to change to 70/30 also. Has sulfa allergy.    Past Medical History:  Diagnosis Date   Diabetes mellitus type 2 in obese ([place]) 02/14/2016   Essential hypertension 02/04/2016   History of chicken pox    Hyperlipidemia    Neuromuscular disorder ([place])    Obesity 02/04/2016   Tobacco abuse 02/14/2016    Exam BP (!) 148/88 (BP Location: Left Arm, Cuff Size: Normal)   Pulse 84   Temp 98.2 F (36.8 C) (Oral)   Ht '5\' 6"'$  (1.676 m)   Wt 177 lb 2 oz (80.3 kg)   SpO2 93%   BMI 28.59 kg/m  General:  well developed, well nourished, in no apparent distress Heart: RRR, no bruits, no LE edema Lungs: clear to auscultation, no accessory muscle use Psych: well oriented with normal range of affect and appropriate judgment/insight  Essential hypertension - Plan: losartan-hydrochlorothiazide (HYZAAR) 100-25 MG tablet, amLODipine (NORVASC) 5 MG tablet  Type 2 diabetes mellitus with diabetic polyneuropathy, without long-term current use of insulin (HCC) - Plan: pioglitazone (ACTOS) 30 MG tablet  Chronic, unstable. Add HCTZ as above. Will reck labs at f/u visit. Norvasc 5 mg/d, Hyzaar 100-25 mg/d. Monitor BP at home. Counseled on diet and exercise. Cont Novolin 70/30 16 u bid, Metformin 500 mg bid, start Actos 30 mg/d. Needs to get back in w Endo. Cont to monitor  BS's at home.   F/u in 1 mo. The patient voiced understanding and agreement to the plan.  [place], DO 12/13/21  11:23 AM

## 2022-01-13 ENCOUNTER — Other Ambulatory Visit (HOSPITAL_COMMUNITY): Payer: Self-pay

## 2022-01-13 ENCOUNTER — Ambulatory Visit: Payer: Medicare Other | Admitting: Family Medicine

## 2022-01-23 ENCOUNTER — Ambulatory Visit (INDEPENDENT_AMBULATORY_CARE_PROVIDER_SITE_OTHER): Payer: Medicare Other | Admitting: Family Medicine

## 2022-01-23 ENCOUNTER — Encounter: Payer: Self-pay | Admitting: Family Medicine

## 2022-01-23 VITALS — BP 118/88 | HR 93 | Temp 98.0°F | Ht 66.0 in | Wt 175.2 lb

## 2022-01-23 DIAGNOSIS — E1142 Type 2 diabetes mellitus with diabetic polyneuropathy: Secondary | ICD-10-CM

## 2022-01-23 DIAGNOSIS — L282 Other prurigo: Secondary | ICD-10-CM

## 2022-01-23 DIAGNOSIS — I10 Essential (primary) hypertension: Secondary | ICD-10-CM | POA: Diagnosis not present

## 2022-01-23 MED ORDER — OLMESARTAN-AMLODIPINE-HCTZ 40-10-25 MG PO TABS: 1.0000 | ORAL_TABLET | Freq: Every day | ORAL | 2 refills | Status: DC

## 2022-01-23 MED ORDER — NOVOLIN 70/30 (70-30) 100 UNIT/ML ~~LOC~~ SUSP
20.0000 [IU] | Freq: Two times a day (BID) | SUBCUTANEOUS | 1 refills | Status: DC
Start: 2022-01-23 — End: 2022-01-27

## 2022-01-23 MED ORDER — TRIAMCINOLONE ACETONIDE 0.1 % EX CREA: 1.0000 | TOPICAL_CREAM | Freq: Two times a day (BID) | CUTANEOUS | 0 refills | Status: DC

## 2022-01-23 NOTE — Patient Instructions (Addendum)
Try not to scratch as this can make things worse. Avoid scented products while dealing with this. You may resume when the itchiness resolves. Cold/cool compresses can help.   Use a non-scented lotion at least twice daily.   Finish out the amlodipine and losartan -hydrochlorothiazide combo pill and then take the new triple-combo pill.   Check your blood pressures 2-3 times per week, alternating the time of day you check it. If it is high, considering waiting 1-2 minutes and rechecking. If it gets higher, your anxiety is likely creeping up and we should avoid rechecking.   Keep the diet clean and stay active.  Let us know if you need anything.

## 2022-01-23 NOTE — Progress Notes (Signed)
Chief Complaint  Patient presents with   Hypertension    1 m f/u     Subjective Tiffany Estrada is a 66 y.o. female who presents for hypertension follow up. She does monitor home blood pressures. Blood pressures ranging from 140's/80's on average. She is compliant with medications- Norvasc 5 mg/d, Hyzaar 100-25 mg/d. Patient has these side effects of medication: none She is adhering to a healthy diet overall. Current exercise: walking No CP or SOB.   DM- taking Novalin 70-30 16 u bid, Metformin 500 mg bid, Actos 30 mg/d. Diet/exercise as above. No lows. Sugars ranging in mid 200's. No low readings.    Past Medical History:  Diagnosis Date   Diabetes mellitus type 2 in obese ([place]) 02/14/2016   Essential hypertension 02/04/2016   History of chicken pox    Hyperlipidemia    Neuromuscular disorder ([place])    Obesity 02/04/2016   Tobacco abuse 02/14/2016    Exam BP 118/88   Pulse 93   Temp 98 F (36.7 C) (Oral)   Ht '5\' 6"'$  (1.676 m)   Wt 175 lb 3.2 oz (79.5 kg)   SpO2 98%   BMI 28.28 kg/m  General:  well developed, well nourished, in no apparent distress Heart: RRR, no bruits, no LE edema Lungs: clear to auscultation, no accessory muscle use Psych: well oriented with normal range of affect and appropriate judgment/insight  Essential hypertension - Plan: Olmesartan-amLODIPine-HCTZ 40-10-25 MG TABS  Type 2 diabetes mellitus with diabetic polyneuropathy, without long-term current use of insulin (HCC)  Pruritic rash - Plan: triamcinolone cream (KENALOG) 0.1 %  Chronic, unsure if fully controlled. Change to triple combo pill with slight increase in Norvasc dosage, OAH- 40-10-25 mg/d. Monitor BP at home. Counseled on diet and exercise. Chronic, uncontrolled. Increase Novolin from 16 u bid to 20 u bid. Cont Metformin 500 mg bid and Actos 30 mg/d. Monitor sugars at home.  F/u in 3 mo. The patient voiced understanding and agreement to the plan.  [place],  DO 01/23/22  11:52 AM

## 2022-01-27 ENCOUNTER — Telehealth: Payer: Self-pay | Admitting: Family Medicine

## 2022-01-27 ENCOUNTER — Other Ambulatory Visit: Payer: Self-pay | Admitting: Internal Medicine

## 2022-01-27 MED ORDER — NOVOLIN 70/30 (70-30) 100 UNIT/ML ~~LOC~~ SUSP: 20.0000 [IU] | Freq: Two times a day (BID) | SUBCUTANEOUS | 1 refills | Status: DC

## 2022-01-27 NOTE — Telephone Encounter (Signed)
Refilled and patient notified

## 2022-01-27 NOTE — Telephone Encounter (Signed)
Medication:   insulin NPH-regular Human (NOVOLIN 70/30) (70-30) 100 UNIT/ML injection [[id number]]   Has the patient contacted their pharmacy? No. (If no, request that the patient contact the pharmacy for the refill.) (If yes, when and what did the pharmacy advise?)  Preferred Pharmacy (with phone number or street name):   [facility] [address] [place], [place] - [address]  [address] [place] [address]  Phone:  [phone]  Fax:  [phone]   Agent: Please be advised that RX refills may take up to 3 business days. We ask that you follow-up with your pharmacy.

## 2022-01-31 MED ORDER — "INSULIN SYRINGE 30G X 5/16"" 0.3 ML MISC": 0 refills | Status: DC

## 2022-01-31 NOTE — Telephone Encounter (Signed)
Sent in

## 2022-01-31 NOTE — Addendum Note (Signed)
Addended by: Sharon Seller B on: 01/31/2022 04:47 PM   Modules accepted: Orders

## 2022-01-31 NOTE — Telephone Encounter (Signed)
Pt stated she is needing the injector pens because she has only received the vials.   [facility] [address] [place], [place] - [address]  [address] [place] [address]  Phone:  [phone]  Fax:  [phone]

## 2022-02-09 ENCOUNTER — Other Ambulatory Visit: Payer: Self-pay | Admitting: Internal Medicine

## 2022-02-10 MED ORDER — NOVOLIN 70/30 FLEXPEN (70-30) 100 UNIT/ML ~~LOC~~ SUPN
20.0000 [IU] | PEN_INJECTOR | Freq: Two times a day (BID) | SUBCUTANEOUS | 2 refills | Status: DC
Start: 2022-02-10 — End: 2022-05-09

## 2022-02-10 NOTE — Telephone Encounter (Signed)
Patient states she needs the flex pen insulin, not the vials. She stated her stuff is usually already mixed. Please advise.

## 2022-02-10 NOTE — Telephone Encounter (Signed)
Called left msg to call back for clarification of refill request.

## 2022-02-10 NOTE — Addendum Note (Signed)
Addended by: Sharon Seller B on: 02/10/2022 04:20 PM   Modules accepted: Orders

## 2022-02-11 ENCOUNTER — Encounter: Payer: Self-pay | Admitting: General Practice

## 2022-03-09 ENCOUNTER — Other Ambulatory Visit: Payer: Self-pay

## 2022-03-09 ENCOUNTER — Emergency Department (HOSPITAL_BASED_OUTPATIENT_CLINIC_OR_DEPARTMENT_OTHER)
Admission: EM | Admit: 2022-03-09 | Discharge: 2022-03-09 | Disposition: A | Discharge status: Home / Self Care | Payer: Medicare Other | Attending: Emergency Medicine | Admitting: Emergency Medicine

## 2022-03-09 ENCOUNTER — Encounter (HOSPITAL_BASED_OUTPATIENT_CLINIC_OR_DEPARTMENT_OTHER): Payer: Self-pay | Admitting: Emergency Medicine

## 2022-03-09 DIAGNOSIS — E119 Type 2 diabetes mellitus without complications: Secondary | ICD-10-CM | POA: Diagnosis not present

## 2022-03-09 DIAGNOSIS — Z79899 Other long term (current) drug therapy: Secondary | ICD-10-CM | POA: Diagnosis not present

## 2022-03-09 DIAGNOSIS — N764 Abscess of vulva: Secondary | ICD-10-CM | POA: Insufficient documentation

## 2022-03-09 DIAGNOSIS — Z7984 Long term (current) use of oral hypoglycemic drugs: Secondary | ICD-10-CM | POA: Diagnosis not present

## 2022-03-09 DIAGNOSIS — I10 Essential (primary) hypertension: Secondary | ICD-10-CM | POA: Diagnosis not present

## 2022-03-09 DIAGNOSIS — Z794 Long term (current) use of insulin: Secondary | ICD-10-CM | POA: Insufficient documentation

## 2022-03-09 DIAGNOSIS — Z91040 Latex allergy status: Secondary | ICD-10-CM | POA: Insufficient documentation

## 2022-03-09 LAB — URINALYSIS, ROUTINE W REFLEX MICROSCOPIC
Bilirubin Urine: NEGATIVE
Glucose, UA: >=500 mg/dL — AB
Ketones, ur: NEGATIVE mg/dL
Nitrite: NEGATIVE
Protein, ur: NEGATIVE mg/dL
Specific Gravity, Urine: 1.01 (ref 1.005–1.030)
pH: 5.5 (ref 5.0–8.0)

## 2022-03-09 LAB — URINALYSIS, MICROSCOPIC (REFLEX)

## 2022-03-09 MED ORDER — LIDOCAINE HCL (PF) 1 % IJ SOLN
10.0000 mL | Freq: Once | INTRAMUSCULAR | Status: AC
  Administered 2022-03-09: 10 mL
  Filled 2022-03-09: qty 10

## 2022-03-09 MED ORDER — DOXYCYCLINE HYCLATE 100 MG PO CAPS: 100.0000 mg | ORAL_CAPSULE | Freq: Two times a day (BID) | ORAL | 0 refills | Status: DC

## 2022-03-09 NOTE — ED Provider Notes (Signed)
[facility] EMERGENCY DEPARTMENT Provider Note   CSN: [id number] Arrival date & time: 03/09/22  8101     History  Chief Complaint  Patient presents with   Abscess    Tiffany Estrada is a 66 y.o. female.  With a history of hypertension, type 2 diabetes who presents the ED for evaluation of vaginal abscess.  States she started to feel the abscess on Thursday.  Symptoms began to worsen on Friday.  Patient has constant pain when sitting and feels like she cannot get comfortable.  Also has pain with defecation.  Patient has had normal bowel movements with her last bowel movement being this morning.  Denies fevers.  Also complaining of burning with urination.  This began approximately 2 weeks ago.    Abscess      Home Medications Prior to Admission medications   Medication Sig Start Date End Date Taking? Authorizing Provider  acetaminophen (TYLENOL) 500 MG tablet Take 2 tablets (1,000 mg total) by mouth every 6 (six) hours as needed. 01/26/16   Charlesetta Shanks, MD  Alcohol Swabs PADS Use as directed 08/25/19   Shelda Pal, DO  atorvastatin (LIPITOR) 40 MG tablet Take 1 tablet (40 mg total) by mouth daily. 10/15/21   Shelda Pal, DO  Blood Glucose Monitoring Suppl ([facility]) w/Device KIT Check blood sugars twice daily 07/04/21   Shelda Pal, DO  cholecalciferol (VITAMIN D3) 25 MCG (1000 UNIT) tablet Take 1,000 Units by mouth daily.    [provider]  gabapentin (NEURONTIN) 300 MG capsule  05/02/19   [provider]  glucose blood (ONETOUCH VERIO) test strip USE 1 STRIP TO CHECK GLUCOSE TWICE A WEEK 09/03/20   Wendling, Crosby Oyster, DO  insulin isophane & regular human KwikPen (NOVOLIN 70/30 KWIKPEN) (70-30) 100 UNIT/ML KwikPen Inject 20 Units into the skin 2 (two) times daily. 02/10/22   Shelda Pal, DO  Insulin Pen Needle (PEN NEEDLES) 32G X 4 MM MISC 1 Device by Does not apply route as directed.  10/18/19   Shamleffer, Melanie Crazier, MD  Insulin Syringe-Needle U-100 (INSULIN SYRINGE .3CC/30GX5/16") 30G X 5/16" 0.3 ML MISC Use with insulin 01/31/22   Wendling, Crosby Oyster, DO  levocetirizine (XYZAL) 5 MG tablet Take 1 tablet (5 mg total) by mouth every evening. 10/24/20   Nani Ravens, Crosby Oyster, DO  metFORMIN (GLUCOPHAGE) 500 MG tablet Take 1 tablet (500 mg total) by mouth 2 (two) times daily with a meal. 06/25/21   Shamleffer, Melanie Crazier, MD  Multiple Vitamins-Minerals (MULTI ADULT GUMMIES PO) Take by mouth. Take 1 gummies by mouth daily.    [provider]  Olmesartan-amLODIPine-HCTZ 40-10-25 MG TABS Take 1 tablet by mouth daily. 01/23/22   Shelda Pal, DO  Omega-3 1000 MG CAPS Take by mouth.    [provider]  OneTouch Delica Lancets 75Z MISC Use twice daily to check blood sugar.  DXE11.9 06/07/21   Shelda Pal, DO  ONETOUCH DELICA LANCETS FINE MISC Use to check sugars twice weekly. 03/03/16   Shelda Pal, DO  pioglitazone (ACTOS) 30 MG tablet Take 1 tablet (30 mg total) by mouth daily. 12/13/21   Shelda Pal, DO  triamcinolone cream (KENALOG) 0.1 % Apply 1 Application topically 2 (two) times daily. 01/23/22   Shelda Pal, DO      Allergies    Latex, Penicillins, Red dye, Shellfish allergy, and Sulfa antibiotics    Review of Systems   Review of Systems  Genitourinary:  Positive for vaginal pain.  All other systems reviewed and are negative.   Physical Exam Updated Vital Signs BP (!) 164/100 (BP Location: Right Arm)   Pulse 89   Temp 97.9 F (36.6 C) (Oral)   Resp 18   Ht _0  (1.676 m)   Wt 78.9 kg   SpO2 98%   BMI 28.08 kg/m  Physical Exam Vitals and nursing note reviewed. Exam conducted with a chaperone present Apolonio Schneiders, Therapist, sports).  Constitutional:      General: She is not in acute distress.    Appearance: Normal appearance. She is normal weight. She is not ill-appearing.  HENT:     Head:  Normocephalic and atraumatic.  Cardiovascular:     Rate and Rhythm: Normal rate.  Pulmonary:     Effort: Pulmonary effort is normal. No respiratory distress.  Abdominal:     General: Abdomen is flat.  Genitourinary:    Labia:        Right: Lesion present.        Comments: 2 cm by 3 cm area of erythema and induration with central fluctuance just lateral to the right inferior aspect of labia majora.  Musculoskeletal:        General: Normal range of motion.     Cervical back: Neck supple.  Skin:    General: Skin is warm and dry.  Neurological:     Mental Status: She is alert and oriented to person, place, and time.  Psychiatric:        Mood and Affect: Mood normal.        Behavior: Behavior normal.     ED Results / Procedures / Treatments   Labs (all labs ordered are listed, but only abnormal results are displayed) Labs Reviewed  URINALYSIS, ROUTINE W REFLEX MICROSCOPIC    EKG None  Radiology No results found.  Procedures .Marland KitchenIncision and Drainage  Date/Time: 03/09/2022 11:09 AM  Performed by: Roylene Reason, PA-C Authorized by: Roylene Reason, PA-C   Consent:    Consent obtained:  Verbal   Consent given by:  Patient   Risks, benefits, and alternatives were discussed: yes     Risks discussed:  Bleeding, incomplete drainage and infection   Alternatives discussed:  No treatment Universal protocol:    Procedure explained and questions answered to patient or proxy's satisfaction: yes     Relevant documents present and verified: yes     Test results available : yes     Imaging studies available: yes     Required blood products, implants, devices, and special equipment available: yes     Site/side marked: yes     Immediately prior to procedure, a time out was called: yes     Patient identity confirmed:  Verbally with patient and arm band Location:    Type:  Abscess   Size:  2 cm x 2 cm   Location:  Anogenital   Anogenital location:  Vulva Pre-procedure  details:    Skin preparation:  Povidone-iodine and chlorhexidine with alcohol Anesthesia:    Anesthesia method:  Local infiltration   Local anesthetic:  Lidocaine 1% w/o epi Procedure type:    Complexity:  Simple Procedure details:    Incision types:  Single straight   Wound management:  Probed and deloculated   Drainage:  Bloody   Drainage amount:  Moderate   Wound treatment:  Wound left open   Packing materials:  None Post-procedure details:    Procedure completion:  Tolerated  Medications Ordered in ED Medications  lidocaine (PF) (XYLOCAINE) 1 % injection 10 mL (10 mLs Infiltration Given by Other 03/09/22 2130)    ED Course/ Medical Decision Making/ A&P                           Medical Decision Making Amount and/or Complexity of Data Reviewed Labs: ordered.  Risk Prescription drug management.  This patient presents to the ED for concern of anogenital abscess, this involves an extensive number of treatment options, and is a complaint that carries with it a high risk of complications and morbidity.  The differential diagnosis includes peroneal or vulvovaginal abscess, Bartholin gland cyst, cellulitis   Co morbidities that complicate the patient evaluation   diabetes  My initial workup includes urinalysis  Additional history obtained from: Nursing notes from this visit.  I ordered, reviewed and interpreted labs which include: Urinalysis.  Was positive for small leukocytes.  Patient will be started on doxycycline for abscess  Afebrile, hemodynamically stable.  Patient is a 66 year old female with a history of diabetes who presents ED for evaluation of vulvar abscess.  This was incised and drained in the ED today.  Patient reported immediate relief of her symptoms.  Due to history of diabetes patient will be started on doxycycline.  She strongly encouraged to follow-up with her primary care provider in approximately 1 week.  Given strict return precautions.  Stable at  discharge.  At this time there does not appear to be any evidence of an acute emergency medical condition and the patient appears stable for discharge with appropriate outpatient follow up. Diagnosis was discussed with patient who verbalizes understanding of care plan and is agreeable to discharge. I have discussed return precautions with patient who verbalizes understanding. Patient encouraged to follow-up with their PCP within 1 week. All questions answered.  Note: Portions of this report may have been transcribed using voice recognition software. Every effort was made to ensure accuracy; however, inadvertent computerized transcription errors may still be present.          Final Clinical Impression(s) / ED Diagnoses Final diagnoses:  None    Rx / DC Orders ED Discharge Orders     None         Roylene Reason, Hershal Coria 03/09/22 1137    Isla Pence, MD 03/09/22 1210

## 2022-03-09 NOTE — Discharge Instructions (Signed)
You have been seen today for your complaint of vulvar abscess. Your lab work showed possible signs of a UTI. Your discharge medications include doxycycline. This is an antibiotic. You should take it as prescribed. You should take it for the entire duration of the prescription. Follow up with: your PCP in 1 week Please seek immediate medical care if you develop any of the following symptoms: You have very bad (severe) pain. You see red streaks on your skin spreading away from the abscess. You see redness that spreads quickly. You have a fever or chills. At this time there does not appear to be the presence of an emergent medical condition, however there is always the potential for conditions to change. Please read and follow the below instructions.  Do not take your medicine if  develop an itchy rash, swelling in your mouth or lips, or difficulty breathing; call 911 and seek immediate emergency medical attention if this occurs.  You may review your lab tests and imaging results in their entirety on your MyChart account.  Please discuss all results of fully with your primary care provider and other specialist at your follow-up visit.  Note: Portions of this text may have been transcribed using voice recognition software. Every effort was made to ensure accuracy; however, inadvertent computerized transcription errors may still be present.

## 2022-03-09 NOTE — ED Triage Notes (Signed)
States she has a boil to to vag area, since Thursday

## 2022-04-18 ENCOUNTER — Encounter: Payer: Self-pay | Admitting: Obstetrics and Gynecology

## 2022-04-18 ENCOUNTER — Other Ambulatory Visit (HOSPITAL_COMMUNITY)
Admission: RE | Admit: 2022-04-18 | Discharge: 2022-04-18 | Disposition: A | Discharge status: Home / Self Care | Payer: Medicare Other | Source: Ambulatory Visit | Attending: Obstetrics and Gynecology | Admitting: Obstetrics and Gynecology

## 2022-04-18 ENCOUNTER — Ambulatory Visit (INDEPENDENT_AMBULATORY_CARE_PROVIDER_SITE_OTHER): Payer: Medicare Other | Admitting: Obstetrics and Gynecology

## 2022-04-18 VITALS — BP 128/75 | HR 86 | Ht 66.0 in | Wt 176.0 lb

## 2022-04-18 DIAGNOSIS — L292 Pruritus vulvae: Secondary | ICD-10-CM | POA: Diagnosis not present

## 2022-04-18 DIAGNOSIS — Z01419 Encounter for gynecological examination (general) (routine) without abnormal findings: Secondary | ICD-10-CM

## 2022-04-18 MED ORDER — TERCONAZOLE 0.8 % VA CREA: 1.0000 | TOPICAL_CREAM | Freq: Every day | VAGINAL | 0 refills | Status: DC

## 2022-04-18 NOTE — Patient Instructions (Signed)
I am going to send a cream as it seems there is

## 2022-04-18 NOTE — Progress Notes (Signed)
ANNUAL EXAM Patient name: Tiffany Estrada MRN [id number]  Date of birth: 05-08-1955 Chief Complaint:   Gynecologic Exam  History of Present Illness:   Tiffany Estrada is a 66 y.o. No obstetric history on file. being seen today for a routine annual exam.  Current complaints: vaginal itching Reports vaginal itching for which she has tried OTC creams, water, ice, without relief. White discharge noted. Reports abscess that was drained last month has resolved. Sugars not well controlled currently.    ED visit 11/5 for vulvar abscess - I&D and doxy  No LMP recorded. Patient has had a hysterectomy.   The pregnancy intention screening data noted above was reviewed. Potential methods of contraception were discussed. The patient elected to proceed with No data recorded.   Last pap s/p hysterectomy  Last mammogram: 05/2020. Results were: normal. Family h/o breast cancer: no Last colonoscopy: 2021. Results were: normal.      01/23/2022   11:05 AM 10/15/2021    9:10 AM 10/24/2020   10:40 AM 10/05/2020   10:10 AM 08/16/2020    1:43 PM  Depression screen PHQ 2/9  Decreased Interest 0 0 2 0 0  Down, Depressed, Hopeless 0 0 2 0 1  PHQ - 2 Score 0 0 4 0 1  Altered sleeping  2 2    Tired, decreased energy  0 1    Change in appetite  0 0    Feeling bad or failure about yourself   0 0    Trouble concentrating  0 0    Moving slowly or fidgety/restless   0    Suicidal thoughts  0 0    PHQ-9 Score  2 7    Difficult doing work/chores  Not difficult at all Not difficult at all           No data to display           Review of Systems:   Pertinent items are noted in HPI Denies any headaches, blurred vision, fatigue, shortness of breath, chest pain, abdominal pain, abnormal vaginal discharge/itching/odor/irritation, problems with periods, bowel movements, urination, or intercourse unless otherwise stated above. Pertinent History Reviewed:  Reviewed past medical,surgical, social and family  history.  Reviewed problem list, medications and allergies. Physical Assessment:   Vitals:   04/18/22 1021  BP: 128/75  Pulse: 86  Weight: 176 lb (79.8 kg)  Height: '5\' 6"'$  (1.676 m)  Body mass index is 28.41 kg/m.        Physical Examination:   General appearance - well appearing, and in no distress  Mental status - alert, oriented to person, place, and time  Psych:  She has a normal mood and affect  Skin - warm and dry, normal color, no suspicious lesions noted  Chest - effort normal, all lung fields clear to auscultation bilaterally  Heart - normal rate and regular rhythm  Breasts - breasts appear normal, no suspicious masses, no skin or nipple changes or  axillary nodes  Abdomen - soft, nontender, nondistended, no masses or organomegaly  Pelvic -  VULVA: normal appearing vulva with no masses, tenderness or lesions   VAGINA: normal appearing introitus with small volume discharge and atrophy noted, no levator ani tenderness  Extremities:  No swelling or varicosities noted  Chaperone present for exam  No results found for this or any previous visit (from the past 24 hour(s)).    Assessment & Plan:  1. Well woman exam Routine screening - MM 3D SCREEN BREAST  BILATERAL; Future - Cervicovaginal ancillary only  2. Vulvar itching Vaginitis screen Cream sent in case of positive result over the weekend  Good glycemic control encouraged and folow up with endo - terconazole (TERAZOL 3) 0.8 % vaginal cream; Place 1 applicator vaginally at bedtime. Apply nightly for three nights.  Dispense: 20 g; Refill: 0     Orders Placed This Encounter  Procedures   MM 3D SCREEN BREAST BILATERAL    Meds:  Meds ordered this encounter  Medications   terconazole (TERAZOL 3) 0.8 % vaginal cream    Sig: Place 1 applicator vaginally at bedtime. Apply nightly for three nights.    Dispense:  20 g    Refill:  0    Follow-up: No follow-ups on file.  Darliss Cheney, MD 04/18/2022 12:20  PM

## 2022-04-21 ENCOUNTER — Encounter (HOSPITAL_BASED_OUTPATIENT_CLINIC_OR_DEPARTMENT_OTHER): Payer: Self-pay

## 2022-04-21 ENCOUNTER — Ambulatory Visit (HOSPITAL_BASED_OUTPATIENT_CLINIC_OR_DEPARTMENT_OTHER)
Admission: RE | Admit: 2022-04-21 | Discharge: 2022-04-21 | Disposition: A | Discharge status: Home / Self Care | Payer: Medicare Other | Source: Ambulatory Visit | Attending: Obstetrics and Gynecology | Admitting: Obstetrics and Gynecology

## 2022-04-21 DIAGNOSIS — Z01419 Encounter for gynecological examination (general) (routine) without abnormal findings: Secondary | ICD-10-CM | POA: Insufficient documentation

## 2022-04-21 DIAGNOSIS — Z1231 Encounter for screening mammogram for malignant neoplasm of breast: Secondary | ICD-10-CM | POA: Insufficient documentation

## 2022-04-21 LAB — CERVICOVAGINAL ANCILLARY ONLY
Bacterial Vaginitis (gardnerella): NEGATIVE
Candida Glabrata: NEGATIVE
Candida Vaginitis: POSITIVE — AB
Comment: NEGATIVE
Comment: NEGATIVE
Comment: NEGATIVE

## 2022-04-23 ENCOUNTER — Ambulatory Visit: Payer: Medicare Other | Admitting: Family Medicine

## 2022-04-24 ENCOUNTER — Encounter: Payer: Self-pay | Admitting: Family Medicine

## 2022-04-24 ENCOUNTER — Ambulatory Visit (INDEPENDENT_AMBULATORY_CARE_PROVIDER_SITE_OTHER): Payer: Medicare Other | Admitting: Family Medicine

## 2022-04-24 VITALS — BP 98/67 | HR 98 | Temp 97.9°F | Resp 16 | Wt 174.0 lb

## 2022-04-24 DIAGNOSIS — Z794 Long term (current) use of insulin: Secondary | ICD-10-CM

## 2022-04-24 DIAGNOSIS — E1165 Type 2 diabetes mellitus with hyperglycemia: Secondary | ICD-10-CM

## 2022-04-24 DIAGNOSIS — L292 Pruritus vulvae: Secondary | ICD-10-CM | POA: Diagnosis not present

## 2022-04-24 DIAGNOSIS — I10 Essential (primary) hypertension: Secondary | ICD-10-CM

## 2022-04-24 DIAGNOSIS — E1142 Type 2 diabetes mellitus with diabetic polyneuropathy: Secondary | ICD-10-CM | POA: Diagnosis not present

## 2022-04-24 LAB — COMPREHENSIVE METABOLIC PANEL
ALT: 21 U/L (ref 0–35)
AST: 18 U/L (ref 0–37)
Albumin: 4.1 g/dL (ref 3.5–5.2)
Alkaline Phosphatase: 108 U/L (ref 39–117)
BUN: 18 mg/dL (ref 6–23)
CO2: 25 mEq/L (ref 19–32)
Calcium: 10.4 mg/dL (ref 8.4–10.5)
Chloride: 96 mEq/L (ref 96–112)
Creatinine, Ser: 0.97 mg/dL (ref 0.40–1.20)
GFR: 61.97 mL/min (ref >60.00)
Glucose, Bld: 311 mg/dL — ABNORMAL HIGH (ref 70–99)
Potassium: 3.8 mEq/L (ref 3.5–5.1)
Sodium: 133 mEq/L — ABNORMAL LOW (ref 135–145)
Total Bilirubin: 0.6 mg/dL (ref 0.2–1.2)
Total Protein: 7.3 g/dL (ref 6.0–8.3)

## 2022-04-24 LAB — LDL CHOLESTEROL, DIRECT: Direct LDL: 129 mg/dL

## 2022-04-24 LAB — LIPID PANEL
Cholesterol: 193 mg/dL (ref 0–200)
HDL: 41 mg/dL (ref >39.00)
NonHDL: 152
Total CHOL/HDL Ratio: 5
Triglycerides: 233 mg/dL — ABNORMAL HIGH (ref 0.0–149.0)
VLDL: 46.6 mg/dL — ABNORMAL HIGH (ref 0.0–40.0)

## 2022-04-24 LAB — MICROALBUMIN / CREATININE URINE RATIO
Creatinine,U: 108.3 mg/dL
Microalb Creat Ratio: 2.9 mg/g (ref 0.0–30.0)
Microalb, Ur: 3.1 mg/dL — ABNORMAL HIGH (ref 0.0–1.9)

## 2022-04-24 LAB — HEMOGLOBIN A1C: Hgb A1c MFr Bld: 12 % — ABNORMAL HIGH (ref 4.6–6.5)

## 2022-04-24 MED ORDER — OLMESARTAN-AMLODIPINE-HCTZ 40-5-25 MG PO TABS: 1.0000 | ORAL_TABLET | Freq: Every day | ORAL | 2 refills | Status: DC

## 2022-04-24 MED ORDER — TERCONAZOLE 0.8 % VA CREA: 1.0000 | TOPICAL_CREAM | Freq: Every day | VAGINAL | 0 refills | Status: AC

## 2022-04-24 MED ORDER — PIOGLITAZONE HCL 30 MG PO TABS: 30.0000 mg | ORAL_TABLET | Freq: Every day | ORAL | 2 refills | Status: DC

## 2022-04-24 MED ORDER — METFORMIN HCL 500 MG PO TABS: 500.0000 mg | ORAL_TABLET | Freq: Two times a day (BID) | ORAL | 3 refills | Status: DC

## 2022-04-24 NOTE — Patient Instructions (Signed)
Keep the diet clean and stay active.

## 2022-04-24 NOTE — Progress Notes (Signed)
Subjective:   Chief Complaint  Patient presents with   Hypertension    Here for follow up   Diabetes    Here for follow up    Tiffany Estrada is a 66 y.o. female here for follow-up of diabetes.   Khalessi's self monitored glucose range is 200-300's.  Patient denies hypoglycemic reactions. She checks her glucose levels 2 time(s) per week. Patient does require insulin.   Medications include: Has not been taking metformin and Actos.  Diet is fair.  Exercise: active at home.   Hypertension Patient presents for hypertension follow up. She does not monitor home blood pressures. She is compliant with medications- olmesartan-amlodipine-HCTZ 40-10-25 mg/d. Patient has these side effects of medication: none Diet/exercise as above.  No Cp or SOB.   Past Medical History:  Diagnosis Date   Diabetes mellitus type 2 in obese ([facility]) 02/14/2016   Essential hypertension 02/04/2016   History of chicken pox    Hyperlipidemia    Neuromuscular disorder ([place])    Obesity 02/04/2016   Tobacco abuse 02/14/2016     Related testing: Retinal exam: Due; scheduled Pneumovax: done  Objective:  BP 98/67 (BP Location: Left Arm, Patient Position: Sitting, Cuff Size: Small)   Pulse 98   Temp 97.9 F (36.6 C) (Oral)   Resp 16   Wt 174 lb (78.9 kg)   SpO2 100%   BMI 28.08 kg/m  General:  Well developed, well nourished, in no apparent distress Skin:  Warm, no pallor or diaphoresis Lungs:  CTAB, no access msc use Cardio:  RRR, no bruits, no LE edema Psych: Age appropriate judgment and insight  Assessment:   Type 2 diabetes mellitus with hyperglycemia, with long-term current use of insulin (HCC) - Plan: Comprehensive metabolic panel, Lipid panel, Hemoglobin A1c, Microalbumin / creatinine urine ratio  Essential hypertension - Plan: Olmesartan-amLODIPine-HCTZ 40-5-25 MG TABS  Vulvar itching - Plan: terconazole (TERAZOL 3) 0.8 % vaginal cream  Type 2 diabetes mellitus with diabetic polyneuropathy,  without long-term current use of insulin (HCC) - Plan: pioglitazone (ACTOS) 30 MG tablet, metFORMIN (GLUCOPHAGE) 500 MG tablet   Plan:   Chronic, unstable. Get back on Actos 30 mg and Metformin 500 mg bid. Cont 70/30 20 u bid for now. Needs to get back in w endo. She will call. Ophtho appt next mo. Counseled on diet and exercise. Chronic, stable but I think we overshot. Drop down to 40-5-25 mg of above combo.  F/u in 1 mo. The patient voiced understanding and agreement to the plan.  [place], DO 04/24/22 1:45 PM

## 2022-04-27 ENCOUNTER — Other Ambulatory Visit: Payer: Self-pay | Admitting: Family Medicine

## 2022-04-27 DIAGNOSIS — E1169 Type 2 diabetes mellitus with other specified complication: Secondary | ICD-10-CM

## 2022-05-09 ENCOUNTER — Telehealth: Payer: Self-pay | Admitting: Family Medicine

## 2022-05-09 MED ORDER — NOVOLIN 70/30 FLEXPEN (70-30) 100 UNIT/ML ~~LOC~~ SUPN: 20.0000 [IU] | PEN_INJECTOR | Freq: Two times a day (BID) | SUBCUTANEOUS | 2 refills | Status: DC

## 2022-05-09 NOTE — Telephone Encounter (Signed)
Sent in and patient notified

## 2022-05-09 NOTE — Telephone Encounter (Signed)
Prescription Request  05/09/2022  Is this a "Controlled Substance" medicine? No  LOV: 04/24/2022  What is the name of the medication or equipment?  insulin isophane & regular human KwikPen (NOVOLIN 70/30 KWIKPEN) (70-30) 100 UNIT/ML KwikPen   Have you contacted your pharmacy to request a refill? No   Which pharmacy would you like this sent to?   [facility] [place], [place] - [address] [place] [address] Phone: [phone] Fax: [phone]   Patient notified that their request is being sent to the clinical staff for review and that they should receive a response within 2 business days.   Please advise at Mobile [phone] (mobile)

## 2022-05-13 ENCOUNTER — Telehealth: Payer: Self-pay | Admitting: Family Medicine

## 2022-05-13 ENCOUNTER — Other Ambulatory Visit: Payer: Self-pay | Admitting: Family Medicine

## 2022-05-13 MED ORDER — HUMULIN 70/30 KWIKPEN (70-30) 100 UNIT/ML ~~LOC~~ SUPN: 20.0000 [IU] | PEN_INJECTOR | Freq: Two times a day (BID) | SUBCUTANEOUS | 11 refills | Status: DC

## 2022-05-13 NOTE — Telephone Encounter (Signed)
Called informed the patient informed of change

## 2022-05-13 NOTE — Telephone Encounter (Signed)
Pharmacy called stating that the inulin sent in was not covered but Humulin is and was wondering if a script could be sent in for it instead.

## 2022-05-19 ENCOUNTER — Encounter: Payer: Self-pay | Admitting: Internal Medicine

## 2022-05-19 ENCOUNTER — Ambulatory Visit: Payer: Medicare Other | Admitting: Internal Medicine

## 2022-05-19 VITALS — BP 124/80 | HR 94 | Wt 174.0 lb

## 2022-05-19 DIAGNOSIS — E1165 Type 2 diabetes mellitus with hyperglycemia: Secondary | ICD-10-CM | POA: Diagnosis not present

## 2022-05-19 DIAGNOSIS — Z794 Long term (current) use of insulin: Secondary | ICD-10-CM

## 2022-05-19 DIAGNOSIS — E1142 Type 2 diabetes mellitus with diabetic polyneuropathy: Secondary | ICD-10-CM | POA: Diagnosis not present

## 2022-05-19 MED ORDER — PIOGLITAZONE HCL 30 MG PO TABS: 30.0000 mg | ORAL_TABLET | Freq: Every day | ORAL | 3 refills | Status: DC

## 2022-05-19 MED ORDER — NOVOLIN 70/30 FLEXPEN RELION (70-30) 100 UNIT/ML ~~LOC~~ SUPN: 20.0000 [IU] | PEN_INJECTOR | Freq: Two times a day (BID) | SUBCUTANEOUS | 3 refills | Status: DC

## 2022-05-19 MED ORDER — PEN NEEDLES 32G X 4 MM MISC: 1.0000 | Freq: Two times a day (BID) | 3 refills | Status: DC

## 2022-05-19 NOTE — Patient Instructions (Addendum)
-  Continue Metformin 500 mg, 1 tablet daily  - Continue Pioglitazone 30 mg , 1 tablet daily  - Take insulin Novolin 70/30 20 units before Breakfast and 20 units before Supper       -HOW TO TREAT LOW BLOOD SUGARS (Blood sugar LESS THAN 70 MG/DL) Please follow the RULE OF 15 for the treatment of hypoglycemia treatment (when your (blood sugars are less than 70 mg/dL)   STEP 1: Take 15 grams of carbohydrates when your blood sugar is low, which includes:  3-4 GLUCOSE TABS  OR 3-4 OZ OF JUICE OR REGULAR SODA OR ONE TUBE OF GLUCOSE GEL    STEP 2: RECHECK blood sugar in 15 MINUTES STEP 3: If your blood sugar is still low at the 15 minute recheck --> then, go back to STEP 1 and treat AGAIN with another 15 grams of carbohydrates.

## 2022-05-19 NOTE — Progress Notes (Signed)
Name: Tiffany Estrada  Age/ Sex: 67 y.o., female   MRN/ DOB: [id number], June 04, 1955     PCP: Shelda Pal, DO   Reason for Endocrinology Evaluation: Type 2 Diabetes Mellitus  Initial Endocrine Consultative Visit: 10/18/2019    PATIENT IDENTIFIER: Tiffany Estrada is a 67 y.o. female with a past medical history of T2DM, HTN and Dyslipidemia. The patient has followed with Endocrinology clinic since 10/18/2019 for consultative assistance with management of her diabetes.  DIABETIC HISTORY:  Tiffany Estrada was diagnosed with I9SW in 5462, trulicity has been ineffective.  Her hemoglobin A1c has ranged from 7.5% in 2018, peaking at 11.9% in 2020.  On her initial visit to our clinic she had an A1c of 11.2%   , she was on Tresiba, and Metformin. We continued metformin , switched basal insulin to insulin Mix.   Trulicity started 11/348  [name] through PCP in 10/2020   Patient self decrease insulin mix by skipping the second dose of insulin makes February 2023, we switched her to basal insulin and continued metformin, Trulicity, and [place]  with an A1c 6.2% ,but she lost insurance and was without medication with an increase in A1c 12.0% PCP restarted insulin mix, metformin and actos by 04/2022    SUBJECTIVE:   During the last visit (06/25/2021): A1c 6.2%.    Today (05/19/2022): Tiffany Estrada is here for a follow up on diabetes management.  She checks her blood sugars occasionally . The patient has not had hypoglycemic episodes since the last clinic visit.  Weight stable  Has not used insulin in a week    HOME DIABETES REGIMEN:  Metformin 500 mg BID -1 tablet daily  Pioglitazone 30 mg daily  Humulin Mix 20 units BID       Statin: yes ACE-I/ARB:  losartan   METER DOWNLOAD SUMMARY: we download 2 weeks dat but there was only one reading of 263 mg/dL on 05/19/2022 at 9 am     DIABETIC COMPLICATIONS: Microvascular complications:  Neuropathy Denies: CKD ,  retinopathy Last Eye Exam: Completed 09/03/2019  Macrovascular complications:   Denies: CAD, CVA, PVD   HISTORY:  Past Medical History:  Past Medical History:  Diagnosis Date   Diabetes mellitus type 2 in obese ([place]) 02/14/2016   Essential hypertension 02/04/2016   History of chicken pox    Hyperlipidemia    Neuromuscular disorder ([place])    Obesity 02/04/2016   Tobacco abuse 02/14/2016   Past Surgical History:  Past Surgical History:  Procedure Laterality Date   ABDOMINAL HYSTERECTOMY     Fibroids   APPENDECTOMY     CHOLECYSTECTOMY     COLONOSCOPY  over 10 years ago   in [place],-normal exam   Social History:  reports that she has been smoking cigarettes. She has been smoking an average of .25 packs per day. She has never used smokeless tobacco. She reports that she does not drink alcohol and does not use drugs. Family History:  Family History  Problem Relation Age of Onset   Pancreatitis Mother    Diabetes Mother    Pancreatitis Sister    Pancreatitis Maternal Aunt    Pancreatitis Maternal Uncle    Colon cancer Neg Hx    Colon polyps Neg Hx    Esophageal cancer Neg Hx    Rectal cancer Neg Hx    Stomach cancer Neg Hx      HOME MEDICATIONS: Allergies as of 05/19/2022       Reactions   Latex  Penicillins    Red Dye    Shellfish Allergy    Sulfa Antibiotics         Medication List        Accurate as of May 19, 2022  7:29 AM. If you have any questions, ask your nurse or doctor.          Alcohol Swabs Pads Use as directed   atorvastatin 40 MG tablet Commonly known as: LIPITOR Take 1 tablet (40 mg total) by mouth daily.   cholecalciferol 25 MCG (1000 UNIT) tablet Commonly known as: VITAMIN D3 Take 1,000 Units by mouth daily.   gabapentin 300 MG capsule Commonly known as: NEURONTIN   HumuLIN 70/30 KwikPen (70-30) 100 UNIT/ML KwikPen Generic drug: insulin isophane & regular human KwikPen Inject 20 Units into the skin in the morning  and at bedtime.   INSULIN SYRINGE .3CC/30GX5/16" 30G X 5/16" 0.3 ML Misc Use with insulin   levocetirizine 5 MG tablet Commonly known as: XYZAL Take 1 tablet (5 mg total) by mouth every evening.   metFORMIN 500 MG tablet Commonly known as: GLUCOPHAGE Take 1 tablet (500 mg total) by mouth 2 (two) times daily with a meal.   MULTI ADULT GUMMIES PO Take by mouth. Take 1 gummies by mouth daily.   Olmesartan-amLODIPine-HCTZ 40-5-25 MG Tabs Take 1 tablet by mouth daily.   Omega-3 1000 MG Caps Take by mouth.   OneTouch Delica Lancets Fine Misc Use to check sugars twice weekly.   OneTouch Delica Lancets 56O Misc Use twice daily to check blood sugar.  DXE11.9   OneTouch Verio Flex System w/Device Kit Check blood sugars twice daily   OneTouch Verio test strip Generic drug: glucose blood USE 1 TEST STRIP TO CHECK BLOOD SUGAR TWICE A WEEK   Pen Needles 32G X 4 MM Misc 1 Device by Does not apply route as directed.   pioglitazone 30 MG tablet Commonly known as: Actos Take 1 tablet (30 mg total) by mouth daily.   terconazole 0.8 % vaginal cream Commonly known as: TERAZOL 3 Place 1 applicator vaginally at bedtime. Apply nightly for three nights.   triamcinolone cream 0.1 % Commonly known as: KENALOG Apply 1 Application topically 2 (two) times daily.         OBJECTIVE:   Vital Signs: There were no vitals taken for this visit.  Wt Readings from Last 3 Encounters:  04/24/22 174 lb (78.9 kg)  04/18/22 176 lb (79.8 kg)  03/09/22 174 lb (78.9 kg)     Exam: General: Pt appears well and is in NAD  Lungs: Clear with good BS bilat with no rales, rhonchi, or wheezes  Heart: RRR with normal S1 and S2 and no gallops; no murmurs; no rub  Extremities: No pretibial edema   Neuro: MS is good with appropriate affect, pt is alert and Ox3   DM Foot Exam 06/25/2021   The skin of the feet is intact without sores or ulcerations.callous formation of the 5th toes , Has a right foot  lateral skin lesion  The pedal pulses are 2+ on right and 2+ on left. The sensation is decreased to a screening 5.07, 10 gram monofilament bilaterally    DATA REVIEWED:  Lab Results  Component Value Date   HGBA1C 12.0 (H) 04/24/2022   HGBA1C 6.2 (A) 06/25/2021   HGBA1C 7.7 (A) 02/19/2021    Latest Reference Range & Units 04/24/22 13:55  Sodium 135 - 145 mEq/L 133 (L)  Potassium 3.5 - 5.1 mEq/L 3.8  Chloride 96 - 112 mEq/L 96  CO2 19 - 32 mEq/L 25  Glucose 70 - 99 mg/dL 311 (H)  BUN 6 - 23 mg/dL 18  Creatinine 0.40 - 1.20 mg/dL 0.97  Calcium 8.4 - 10.5 mg/dL 10.4  Alkaline Phosphatase 39 - 117 U/L 108  Albumin 3.5 - 5.2 g/dL 4.1  AST 0 - 37 U/L 18  ALT 0 - 35 U/L 21  Total Protein 6.0 - 8.3 g/dL 7.3  Total Bilirubin 0.2 - 1.2 mg/dL 0.6  GFR >60.00 mL/min 61.97  Total CHOL/HDL Ratio  5  Cholesterol 0 - 200 mg/dL 193  HDL Cholesterol >39.00 mg/dL 41.00  Direct LDL mg/dL 129.0  MICROALB/CREAT RATIO 0.0 - 30.0 mg/g 2.9  NonHDL  152.00  Triglycerides 0.0 - 149.0 mg/dL 233.0 (H)  VLDL 0.0 - 40.0 mg/dL 46.6 (H)    Latest Reference Range & Units 04/24/22 13:55  Creatinine,U mg/dL 108.3  Microalb, Ur 0.0 - 1.9 mg/dL 3.1 (H)  MICROALB/CREAT RATIO 0.0 - 30.0 mg/g 2.9  (H): Data is abnormally high   ASSESSMENT / PLAN / RECOMMENDATIONS:   1) Type 2 Diabetes Mellitus, Poorly controlled, With neuropathic  complications - Most recent A1c of 12.0% %. Goal A1c <7.0 %.     -Her A1c has increased from 6.2% to 12.0% due to lack of medication intake due to lack of health insurance -Her PCP had prescribed insulin last month, but somehow she is still having issues taking it on a consistent basis -Patient is questioning if 40 units a day of insulin is too much?  I did explain to the patient that insulin doses are adjusted based on her glucose readings and at this time she has persistent hyperglycemia >200 mg/dL -Patient encouraged to take her insulin before breakfast and supper -We again  discussed risk of blindness, ESRD, increased risk of amputation with uncontrolled diabetes -She would like to hold off on restarting [place] or Trulicity at this time as she is not sure about the cost -No changes    MEDICATIONS:  Continue metformin 1 tablet daily Continue pioglitazone 30 mg daily Restart Novolin mix 70/30 at 20 units twice daily    EDUCATION / INSTRUCTIONS: BG monitoring instructions: Patient is instructed to check her blood sugars 2 times a day, before breakfast and supper . Call [facility] if: BG persistently < 70 I reviewed the Rule of 15 for the treatment of hypoglycemia in detail with the patient. Literature supplied.   2) Diabetic complications:  Eye: Does not have known diabetic retinopathy.  Neuro/ Feet: Does have known diabetic peripheral neuropathy .  Renal: Patient does not  have known baseline CKD. She   is not on an ACEI/ARB at present.     F/U in 4 months     Signed electronically by: Mack Guise, MD  [facility] Endocrinology  [facility] [address] [place], [place] [address] Phone: [phone] FAX: [phone]   CC: Shelda Pal, [address] [place] [address] Phone: [phone]  Fax: [phone]  Return to Endocrinology clinic as below: Future Appointments  Date Time Provider [place]  05/19/2022 10:50 AM Garcia Dalzell, Melanie Crazier, MD [facility] None

## 2022-10-03 ENCOUNTER — Ambulatory Visit: Payer: Medicare Other | Admitting: Internal Medicine

## 2022-10-03 ENCOUNTER — Encounter: Payer: Self-pay | Admitting: Internal Medicine

## 2022-10-03 VITALS — BP 120/74 | HR 89 | Ht 66.0 in | Wt 173.0 lb

## 2022-10-03 DIAGNOSIS — E1165 Type 2 diabetes mellitus with hyperglycemia: Secondary | ICD-10-CM

## 2022-10-03 DIAGNOSIS — Z794 Long term (current) use of insulin: Secondary | ICD-10-CM | POA: Diagnosis not present

## 2022-10-03 DIAGNOSIS — E1142 Type 2 diabetes mellitus with diabetic polyneuropathy: Secondary | ICD-10-CM | POA: Diagnosis not present

## 2022-10-03 LAB — POCT GLYCOSYLATED HEMOGLOBIN (HGB A1C): Hemoglobin A1C: 10.2 % — AB (ref 4.0–5.6)

## 2022-10-03 MED ORDER — PIOGLITAZONE HCL 30 MG PO TABS: 30.0000 mg | ORAL_TABLET | Freq: Every day | ORAL | 3 refills | Status: DC

## 2022-10-03 MED ORDER — OZEMPIC (0.25 OR 0.5 MG/DOSE) 2 MG/3ML ~~LOC~~ SOPN: 0.5000 mg | PEN_INJECTOR | SUBCUTANEOUS | 3 refills | Status: DC

## 2022-10-03 MED ORDER — METFORMIN HCL ER 500 MG PO TB24: 1000.0000 mg | ORAL_TABLET | Freq: Every day | ORAL | 3 refills | Status: DC

## 2022-10-03 NOTE — Progress Notes (Signed)
Name: Tiffany Estrada  Age/ Sex: 67 y.o., female   MRN/ DOB: [id number], [birth year]     PCP: Sharlene Dory, DO   Reason for Endocrinology Evaluation: Type 2 Diabetes Mellitus  Initial Endocrine Consultative Visit: 10/18/2019    PATIENT IDENTIFIER: Tiffany Estrada is a 67 y.o. female with a past medical history of T2DM, HTN and Dyslipidemia. The patient has followed with Endocrinology clinic since 10/18/2019 for consultative assistance with management of her diabetes.  DIABETIC HISTORY:  Tiffany Estrada was diagnosed with T2DM in 2017, trulicity has been ineffective.  Her hemoglobin A1c has ranged from 7.5% in 2018, peaking at 11.9% in 2020.  On her initial visit to our clinic she had an A1c of 11.2%   , she was on Tresiba, and Metformin. We continued metformin , switched basal insulin to insulin Mix.   Trulicity started 06/2020  [place]ies [place] through PCP in 10/2020   Patient self decrease insulin mix by skipping the second dose of insulin makes February 2023, we switched her to basal insulin and continued metformin, Trulicity, and [place]  with an A1c 6.2% ,but she lost insurance and was without medication with an increase in A1c 12.0% PCP restarted insulin mix, metformin and actos by 04/2022    SUBJECTIVE:   During the last visit (05/19/2022): A1c 12%.    Today (10/03/2022): Tiffany Estrada is here for a follow up on diabetes management.  She checks her blood sugars occasionally . The patient has not had hypoglycemic episodes since the last clinic visit.   She takes acots once weekly    HOME DIABETES REGIMEN:  Metformin 500 mg BID -1 tablet daily  Pioglitazone 30 mg daily  Humulin Mix 20 units BID - 16 units       Statin: yes ACE-I/ARB:  losartan   METER DOWNLOAD SUMMARY: we download 2 weeks dat but there was only one reading of 263 mg/dL on 08/11/8117 at 9 am     DIABETIC COMPLICATIONS: Microvascular complications:  Neuropathy Denies: CKD , retinopathy Last  Eye Exam: Completed 09/03/2019  Macrovascular complications:   Denies: CAD, CVA, PVD   HISTORY:  Past Medical History:  Past Medical History:  Diagnosis Date   Diabetes mellitus type 2 in obese 02/14/2016   Essential hypertension 02/04/2016   History of chicken pox    Hyperlipidemia    Neuromuscular disorder ([facility])    Obesity 02/04/2016   Tobacco abuse 02/14/2016   Past Surgical History:  Past Surgical History:  Procedure Laterality Date   ABDOMINAL HYSTERECTOMY     Fibroids   APPENDECTOMY     CHOLECYSTECTOMY     COLONOSCOPY  over 10 years ago   in [place],-normal exam   Social History:  reports that she has been smoking cigarettes. She has been smoking an average of .25 packs per day. She has never used smokeless tobacco. She reports that she does not drink alcohol and does not use drugs. Family History:  Family History  Problem Relation Age of Onset   Pancreatitis Mother    Diabetes Mother    Pancreatitis Sister    Pancreatitis Maternal Aunt    Pancreatitis Maternal Uncle    Colon cancer Neg Hx    Colon polyps Neg Hx    Esophageal cancer Neg Hx    Rectal cancer Neg Hx    Stomach cancer Neg Hx      HOME MEDICATIONS: Allergies as of 10/03/2022       Reactions   Latex  Penicillins    Red Dye    Shellfish Allergy    Sulfa Antibiotics         Medication List        Accurate as of Oct 03, 2022  4:33 PM. If you have any questions, ask your nurse or doctor.          STOP taking these medications    metFORMIN 500 MG tablet Commonly known as: GLUCOPHAGE Replaced by: metFORMIN 500 MG 24 hr tablet Stopped by: Tiffany Shorts, MD       TAKE these medications    Alcohol Swabs Pads Use as directed   atorvastatin 40 MG tablet Commonly known as: LIPITOR Take 1 tablet (40 mg total) by mouth daily.   cholecalciferol 25 MCG (1000 UNIT) tablet Commonly known as: VITAMIN D3 Take 1,000 Units by mouth daily.   gabapentin 300 MG  capsule Commonly known as: NEURONTIN   INSULIN SYRINGE .3CC/30GX5/16" 30G X 5/16" 0.3 ML Misc Use with insulin   levocetirizine 5 MG tablet Commonly known as: XYZAL Take 1 tablet (5 mg total) by mouth every evening.   metFORMIN 500 MG 24 hr tablet Commonly known as: GLUCOPHAGE-XR Take 2 tablets (1,000 mg total) by mouth daily with breakfast. Replaces: metFORMIN 500 MG tablet Started by: Tiffany Shorts, MD   MULTI ADULT GUMMIES PO Take by mouth. Take 1 gummies by mouth daily.   NovoLIN 70/30 Kwikpen (70-30) 100 UNIT/ML KwikPen Generic drug: insulin isophane & regular human KwikPen Inject 20 Units into the skin in the morning and at bedtime.   Olmesartan-amLODIPine-HCTZ 40-5-25 MG Tabs Take 1 tablet by mouth daily.   Omega-3 1000 MG Caps Take by mouth.   OneTouch Delica Lancets Fine Misc Use to check sugars twice weekly.   OneTouch Delica Lancets 33G Misc Use twice daily to check blood sugar.  DXE11.9   OneTouch Verio Flex System w/Device Kit Check blood sugars twice daily   OneTouch Verio test strip Generic drug: glucose blood USE 1 TEST STRIP TO CHECK BLOOD SUGAR TWICE A WEEK   Pen Needles 32G X 4 MM Misc 1 Device by Does not apply route in the morning and at bedtime.   pioglitazone 30 MG tablet Commonly known as: Actos Take 1 tablet (30 mg total) by mouth daily.   terconazole 0.8 % vaginal cream Commonly known as: TERAZOL 3 Place 1 applicator vaginally at bedtime. Apply nightly for three nights.   triamcinolone cream 0.1 % Commonly known as: KENALOG Apply 1 Application topically 2 (two) times daily.         OBJECTIVE:   Vital Signs: BP 120/74 (BP Location: Left Arm, Patient Position: Sitting, Cuff Size: Small)   Pulse 89   Ht 5\' 6"  (1.676 m)   Wt 173 lb (78.5 kg)   SpO2 97%   BMI 27.92 kg/m   Wt Readings from Last 3 Encounters:  10/03/22 173 lb (78.5 kg)  05/19/22 174 lb (78.9 kg)  04/24/22 174 lb (78.9 kg)     Exam: General: Pt  appears well and is in NAD  Lungs: Clear with good BS bilat   Heart: RRR   Extremities: No pretibial edema   Neuro: MS is good with appropriate affect, pt is alert and Ox3   DM Foot Exam 10/03/2022   The skin of the feet is intact without sores or ulcerations.callous formation of the 5th toes , Has a right foot lateral skin lesion  The pedal pulses are 2+ on right and 2+  on left. The sensation is decreased to a screening 5.07, 10 gram monofilament on the right     DATA REVIEWED:  Lab Results  Component Value Date   HGBA1C 10.2 (A) 10/03/2022   HGBA1C 12.0 (H) 04/24/2022   HGBA1C 6.2 (A) 06/25/2021    Latest Reference Range & Units 04/24/22 13:55  Sodium 135 - 145 mEq/L 133 (L)  Potassium 3.5 - 5.1 mEq/L 3.8  Chloride 96 - 112 mEq/L 96  CO2 19 - 32 mEq/L 25  Glucose 70 - 99 mg/dL 621 (H)  BUN 6 - 23 mg/dL 18  Creatinine 3.08 - 6.57 mg/dL 8.46  Calcium 8.4 - 96.2 mg/dL 95.2  Alkaline Phosphatase 39 - 117 U/L 108  Albumin 3.5 - 5.2 g/dL 4.1  AST 0 - 37 U/L 18  ALT 0 - 35 U/L 21  Total Protein 6.0 - 8.3 g/dL 7.3  Total Bilirubin 0.2 - 1.2 mg/dL 0.6  GFR >84.13 mL/min 61.97  Total CHOL/HDL Ratio  5  Cholesterol 0 - 200 mg/dL 244  HDL Cholesterol >01.02 mg/dL 72.53  Direct LDL mg/dL 664.4  MICROALB/CREAT RATIO 0.0 - 30.0 mg/g 2.9  NonHDL  152.00  Triglycerides 0.0 - 149.0 mg/dL 034.7 (H)  VLDL 0.0 - 42.5 mg/dL 95.6 (H)    Latest Reference Range & Units 04/24/22 13:55  Creatinine,U mg/dL 387.5  Microalb, Ur 0.0 - 1.9 mg/dL 3.1 (H)  MICROALB/CREAT RATIO 0.0 - 30.0 mg/g 2.9  (H): Data is abnormally high   ASSESSMENT / PLAN / RECOMMENDATIONS:   1) Type 2 Diabetes Mellitus, Poorly controlled, With neuropathic  complications - Most recent A1c of 10.2% %. Goal A1c <7.0 %.     -A1c continues to be above goal, but it has trended down from 12.0% to 10.2% -Patient continues with medication nonadherence, she has been taking less insulin than previously prescribed, she also  takes her insulin once a day instead of twice daily, most times.  She has been taking pioglitazone once a week???  She thought it was related to her kidney function -She used to be on Trulicity and [place] but this became cost prohibitive, today she agreed to start Ozempic, but she would like to try patient assistance, for which she was given forms to fill out -She was advised to increase her insulin to 20 units twice daily, but once she starts Ozempic to decrease it to 16 units twice daily -I will also increase her metformin as below   MEDICATIONS:  Increase metformin 2 tablet daily Take pioglitazone 30 mg daily Increase Novolin mix 70/30 at 20 units twice daily Start Ozempic 0.25 mg once weekly, for 6 weeks, then increase to 0.5 mg weekly    EDUCATION / INSTRUCTIONS: BG monitoring instructions: Patient is instructed to check her blood sugars 2 times a day, before breakfast and supper . Call [place] [facility] clinic if: BG persistently < 70 I reviewed the Rule of 15 for the treatment of hypoglycemia in detail with the patient. Literature supplied.   2) Diabetic complications:  Eye: Does not have known diabetic retinopathy.  Neuro/ Feet: Does have known diabetic peripheral neuropathy .  Renal: Patient does not  have known baseline CKD. She   is not on an ACEI/ARB at present.     F/U in 3 months     Signed electronically by: Lyndle Herrlich, MD  [facility]ocrin[facility]  [facility] [address] [place] [place], [place] [address] Phone: [phone] FAX: [phone]   CC: Arva Chafe  Renae Fickle, DO [address] [place] [address] Phone: [phone]  Fax: [phone]  Return to Endocrinology clinic as below: Future Appointments  Date Time Provider Department Center  12/12/2022  9:30 AM Analyn Matusek, Konrad Dolores, MD [facility] None

## 2022-10-03 NOTE — Patient Instructions (Addendum)
-   Increase  Metformin 500 mg, 2 tablets daily  - Take Pioglitazone 30 mg , 1 tablet daily  - Take insulin Novolin 70/30 20 units before Breakfast and 20 units before Supper   If you start Ozempic, take 0.25 mg once weekly for 6 weeks, than increase to 0.5 mg once weekly and reduce insulin to 16 units twice daily       -HOW TO TREAT LOW BLOOD SUGARS (Blood sugar LESS THAN 70 MG/DL) Please follow the RULE OF 15 for the treatment of hypoglycemia treatment (when your (blood sugars are less than 70 mg/dL)   STEP 1: Take 15 grams of carbohydrates when your blood sugar is low, which includes:  3-4 GLUCOSE TABS  OR 3-4 OZ OF JUICE OR REGULAR SODA OR ONE TUBE OF GLUCOSE GEL    STEP 2: RECHECK blood sugar in 15 MINUTES STEP 3: If your blood sugar is still low at the 15 minute recheck --> then, go back to STEP 1 and treat AGAIN with another 15 grams of carbohydrates.

## 2022-11-17 ENCOUNTER — Ambulatory Visit (INDEPENDENT_AMBULATORY_CARE_PROVIDER_SITE_OTHER): Payer: Medicare Other | Admitting: *Deleted

## 2022-11-17 DIAGNOSIS — Z0000 Encounter for general adult medical examination without abnormal findings: Secondary | ICD-10-CM | POA: Diagnosis not present

## 2022-11-17 NOTE — Patient Instructions (Signed)
[name] , Thank you for taking time to come for your Medicare Wellness Visit. I appreciate your ongoing commitment to your health goals. Please review the following plan we discussed and let me know if I can assist you in the future.     This is a list of the screening recommended for you and due dates:  Health Maintenance  Topic Date Due   Eye exam for diabetics  02/14/2022   Zoster (Shingles) Vaccine (2 of 2) 05/14/2022   Flu Shot  12/04/2022   Hemoglobin A1C  04/04/2023   Yearly kidney function blood test for diabetes  04/25/2023   Yearly kidney health urinalysis for diabetes  04/25/2023   Medicare Annual Wellness Visit  11/17/2023   Mammogram  04/21/2024   Colon Cancer Screening  12/09/2026   DTaP/Tdap/Td vaccine (2 - Td or Tdap) 02/24/2029   COVID-19 Vaccine  Completed   Hepatitis C Screening  Completed   HIV Screening  Completed   HPV Vaccine  Aged Out   Complete foot exam   Discontinued   Pap Smear  Discontinued     Next appointment: Follow up in one year for your annual wellness visit.   Preventive Care 40-64 Years, Female Preventive care refers to lifestyle choices and visits with your health care provider that can promote health and wellness. What does preventive care include? A yearly physical exam. This is also called an annual well check. Dental exams once or twice a year. Routine eye exams. Ask your health care provider how often you should have your eyes checked. Personal lifestyle choices, including: Daily care of your teeth and gums. Regular physical activity. Eating a healthy diet. Avoiding tobacco and drug use. Limiting alcohol use. Practicing safe sex. Taking low-dose aspirin daily starting at age 66. Taking vitamin and mineral supplements as recommended by your health care provider. What happens during an annual well check? The services and screenings done by your health care provider during your annual well check will depend on your age, overall  health, lifestyle risk factors, and family history of disease. Counseling  Your health care provider may ask you questions about your: Alcohol use. Tobacco use. Drug use. Emotional well-being. Home and relationship well-being. Sexual activity. Eating habits. Work and work Astronomer. Method of birth control. Menstrual cycle. Pregnancy history. Screening  You may have the following tests or measurements: Height, weight, and BMI. Blood pressure. Lipid and cholesterol levels. These may be checked every 5 years, or more frequently if you are over 70 years old. Skin check. Lung cancer screening. You may have this screening every year starting at age 84 if you have a 30-pack-year history of smoking and currently smoke or have quit within the past 15 years. Fecal occult blood test (FOBT) of the stool. You may have this test every year starting at age 33. Flexible sigmoidoscopy or colonoscopy. You may have a sigmoidoscopy every 5 years or a colonoscopy every 10 years starting at age 43. Hepatitis C blood test. Hepatitis B blood test. Sexually transmitted disease (STD) testing. Diabetes screening. This is done by checking your blood sugar (glucose) after you have not eaten for a while (fasting). You may have this done every 1-3 years. Mammogram. This may be done every 1-2 years. Talk to your health care provider about when you should start having regular mammograms. This may depend on whether you have a family history of breast cancer. BRCA-related cancer screening. This may be done if you have a family history of breast,  ovarian, tubal, or peritoneal cancers. Pelvic exam and Pap test. This may be done every 3 years starting at age 17. Starting at age 67, this may be done every 5 years if you have a Pap test in combination with an HPV test. Bone density scan. This is done to screen for osteoporosis. You may have this scan if you are at high risk for osteoporosis. Discuss your test results,  treatment options, and if necessary, the need for more tests with your health care provider. Vaccines  Your health care provider may recommend certain vaccines, such as: Influenza vaccine. This is recommended every year. Tetanus, diphtheria, and acellular pertussis (Tdap, Td) vaccine. You may need a Td booster every 10 years. Zoster vaccine. You may need this after age 50. Pneumococcal 13-valent conjugate (PCV13) vaccine. You may need this if you have certain conditions and were not previously vaccinated. Pneumococcal polysaccharide (PPSV23) vaccine. You may need one or two doses if you smoke cigarettes or if you have certain conditions. Talk to your health care provider about which screenings and vaccines you need and how often you need them. This information is not intended to replace advice given to you by your health care provider. Make sure you discuss any questions you have with your health care provider. Document Released: 05/18/2015 Document Revised: 01/09/2016 Document Reviewed: 02/20/2015 Elsevier Interactive Patient Education  2017 [employer].    Fall Prevention in the Home Falls can cause injuries. They can happen to people of all ages. There are many things you can do to make your home safe and to help prevent falls. What can I do on the outside of my home? Regularly fix the edges of walkways and driveways and fix any cracks. Remove anything that might make you trip as you walk through a door, such as a raised step or threshold. Trim any bushes or trees on the path to your home. Use bright outdoor lighting. Clear any walking paths of anything that might make someone trip, such as rocks or tools. Regularly check to see if handrails are loose or broken. Make sure that both sides of any steps have handrails. Any raised decks and porches should have guardrails on the edges. Have any leaves, snow, or ice cleared regularly. Use sand or salt on walking paths during winter. Clean  up any spills in your garage right away. This includes oil or grease spills. What can I do in the bathroom? Use night lights. Install grab bars by the toilet and in the tub and shower. Do not use towel bars as grab bars. Use non-skid mats or decals in the tub or shower. If you need to sit down in the shower, use a plastic, non-slip stool. Keep the floor dry. Clean up any water that spills on the floor as soon as it happens. Remove soap buildup in the tub or shower regularly. Attach bath mats securely with double-sided non-slip rug tape. Do not have throw rugs and other things on the floor that can make you trip. What can I do in the bedroom? Use night lights. Make sure that you have a light by your bed that is easy to reach. Do not use any sheets or blankets that are too big for your bed. They should not hang down onto the floor. Have a firm chair that has side arms. You can use this for support while you get dressed. Do not have throw rugs and other things on the floor that can make you trip. What can  I do in the kitchen? Clean up any spills right away. Avoid walking on wet floors. Keep items that you use a lot in easy-to-reach places. If you need to reach something above you, use a strong step stool that has a grab bar. Keep electrical cords out of the way. Do not use floor polish or wax that makes floors slippery. If you must use wax, use non-skid floor wax. Do not have throw rugs and other things on the floor that can make you trip. What can I do with my stairs? Do not leave any items on the stairs. Make sure that there are handrails on both sides of the stairs and use them. Fix handrails that are broken or loose. Make sure that handrails are as long as the stairways. Check any carpeting to make sure that it is firmly attached to the stairs. Fix any carpet that is loose or worn. Avoid having throw rugs at the top or bottom of the stairs. If you do have throw rugs, attach them to the  floor with carpet tape. Make sure that you have a light switch at the top of the stairs and the bottom of the stairs. If you do not have them, ask someone to add them for you. What else can I do to help prevent falls? Wear shoes that: Do not have high heels. Have rubber bottoms. Are comfortable and fit you well. Are closed at the toe. Do not wear sandals. If you use a stepladder: Make sure that it is fully opened. Do not climb a closed stepladder. Make sure that both sides of the stepladder are locked into place. Ask someone to hold it for you, if possible. Clearly mark and make sure that you can see: Any grab bars or handrails. First and last steps. Where the edge of each step is. Use tools that help you move around (mobility aids) if they are needed. These include: Canes. Walkers. Scooters. Crutches. Turn on the lights when you go into a dark area. Replace any light bulbs as soon as they burn out. Set up your furniture so you have a clear path. Avoid moving your furniture around. If any of your floors are uneven, fix them. If there are any pets around you, be aware of where they are. Review your medicines with your doctor. Some medicines can make you feel dizzy. This can increase your chance of falling. Ask your doctor what other things that you can do to help prevent falls. This information is not intended to replace advice given to you by your health care provider. Make sure you discuss any questions you have with your health care provider. Document Released: 02/15/2009 Document Revised: 09/27/2015 Document Reviewed: 05/26/2014 Elsevier Interactive Patient Education  2017 [employer].

## 2022-11-17 NOTE — Progress Notes (Signed)
Subjective:   Tiffany Estrada is a 67 y.o. female who presents for an Initial Medicare Annual Wellness Visit.  Visit Complete: Virtual  I connected with  Tiffany Estrada on 11/17/22 by a audio enabled telemedicine application and verified that I am speaking with the correct person using two identifiers.  Patient Location: Home  Provider Location: Office/Clinic  I discussed the limitations of evaluation and management by telemedicine. The patient expressed understanding and agreed to proceed.   Review of Systems     Cardiac Risk Factors include: diabetes mellitus;dyslipidemia;hypertension     Objective:    There were no vitals filed for this visit. There is no height or weight on file to calculate BMI. Per patient no change in vitals since last visit, unable to obtain new vitals due to telehealth visit     11/17/2022   11:23 AM 03/09/2022    9:32 AM 08/16/2020    1:43 PM 06/27/2017    1:12 PM 01/26/2016    8:34 AM 12/29/2014   11:05 AM  Advanced Directives  Does Patient Have a Medical Advance Directive? No No No No No No  Would patient like information on creating a medical advance directive? No - Patient declined No - Patient declined No - Patient declined  No - patient declined information No - patient declined information    Current Medications (verified) Outpatient Encounter Medications as of 11/17/2022  Medication Sig   Alcohol Swabs PADS Use as directed   atorvastatin (LIPITOR) 40 MG tablet Take 1 tablet (40 mg total) by mouth daily.   Blood Glucose Monitoring Suppl (ONETOUCH VERIO FLEX SYSTEM) w/Device KIT Check blood sugars twice daily   cholecalciferol (VITAMIN D3) 25 MCG (1000 UNIT) tablet Take 1,000 Units by mouth daily.   gabapentin (NEURONTIN) 300 MG capsule    glucose blood (ONETOUCH VERIO) test strip USE 1 TEST STRIP TO CHECK BLOOD SUGAR TWICE A WEEK   insulin isophane & regular human KwikPen (NOVOLIN 70/30 KWIKPEN) (70-30) 100 UNIT/ML KwikPen Inject 20 Units  into the skin in the morning and at bedtime.   Insulin Pen Needle (PEN NEEDLES) 32G X 4 MM MISC 1 Device by Does not apply route in the morning and at bedtime.   Insulin Syringe-Needle U-100 (INSULIN SYRINGE .3CC/30GX5/16") 30G X 5/16" 0.3 ML MISC Use with insulin   levocetirizine (XYZAL) 5 MG tablet Take 1 tablet (5 mg total) by mouth every evening.   metFORMIN (GLUCOPHAGE-XR) 500 MG 24 hr tablet Take 2 tablets (1,000 mg total) by mouth daily with breakfast.   Multiple Vitamins-Minerals (MULTI ADULT GUMMIES PO) Take by mouth. Take 1 gummies by mouth daily.   Olmesartan-amLODIPine-HCTZ 40-5-25 MG TABS Take 1 tablet by mouth daily.   Omega-3 1000 MG CAPS Take by mouth.   OneTouch Delica Lancets 33G MISC Use twice daily to check blood sugar.  DXE11.9   ONETOUCH DELICA LANCETS FINE MISC Use to check sugars twice weekly.   pioglitazone (ACTOS) 30 MG tablet Take 1 tablet (30 mg total) by mouth daily.   Semaglutide,0.25 or 0.5MG /DOS, (OZEMPIC, 0.25 OR 0.5 MG/DOSE,) 2 MG/3ML SOPN Inject 0.5 mg into the skin once a week.   terconazole (TERAZOL 3) 0.8 % vaginal cream Place 1 applicator vaginally at bedtime. Apply nightly for three nights.   triamcinolone cream (KENALOG) 0.1 % Apply 1 Application topically 2 (two) times daily.   No facility-administered encounter medications on file as of 11/17/2022.    Allergies (verified) Latex, Penicillins, Red dye, Shellfish allergy, and Sulfa  antibiotics   History: Past Medical History:  Diagnosis Date   Diabetes mellitus type 2 in obese 02/14/2016   Essential hypertension 02/04/2016   History of chicken pox    Hyperlipidemia    Neuromuscular disorder (HCC)    Obesity 02/04/2016   Tobacco abuse 02/14/2016   Past Surgical History:  Procedure Laterality Date   ABDOMINAL HYSTERECTOMY     Fibroids   APPENDECTOMY     CHOLECYSTECTOMY     COLONOSCOPY  over 10 years ago   in [place]-normal exam   Family History  Problem Relation Age of Onset    Pancreatitis Mother    Diabetes Mother    Pancreatitis Sister    Pancreatitis Maternal Aunt    Pancreatitis Maternal Uncle    Colon cancer Neg Hx    Colon polyps Neg Hx    Esophageal cancer Neg Hx    Rectal cancer Neg Hx    Stomach cancer Neg Hx    Social History   Socioeconomic History   Marital status: Married    Spouse name: Not on file   Number of children: Not on file   Years of education: Not on file   Highest education level: Not on file  Occupational History   Not on file  Tobacco Use   Smoking status: Some Days    Current packs/day: 0.25    Types: Cigarettes   Smokeless tobacco: Never   Tobacco comments:    3 cigarettes oer day  Vaping Use   Vaping status: Never Used  Substance and Sexual Activity   Alcohol use: No    Alcohol/week: 0.0 standard drinks of alcohol   Drug use: No   Sexual activity: Not on file  Other Topics Concern   Not on file  Social History Narrative   Not on file   Social Determinants of Health   Financial Resource Strain: Low Risk  (11/17/2022)   Overall Financial Resource Strain (CARDIA)    Difficulty of Paying Living Expenses: Not very hard  Food Insecurity: Food Insecurity Present (11/17/2022)   Hunger Vital Sign    Worried About Running Out of Food in the Last Year: Sometimes true    Ran Out of Food in the Last Year: Never true  Transportation Needs: No Transportation Needs (11/17/2022)   PRAPARE - Administrator, Civil Service (Medical): No    Lack of Transportation (Non-Medical): No  Physical Activity: Sufficiently Active (11/17/2022)   Exercise Vital Sign    Days of Exercise per Week: 7 days    Minutes of Exercise per Session: 30 min  Stress: No Stress Concern Present (11/17/2022)   [employer] of Occupational Health - Occupational Stress Questionnaire    Feeling of Stress : Only a little  Social Connections: Moderately Integrated (11/17/2022)   Social Connection and Isolation Panel [NHANES]    Frequency of  Communication with Friends and Family: More than three times a week    Frequency of Social Gatherings with Friends and Family: Once a week    Attends Religious Services: More than 4 times per year    Active Member of [employer] or Organizations: No    Attends Engineer, structural: Never    Marital Status: Married    Tobacco Counseling Ready to quit: Not Answered Counseling given: Not Answered Tobacco comments: 3 cigarettes oer day   Clinical Intake:  Pre-visit preparation completed: Yes  Pain : No/denies pain  Nutritional Risks: None Diabetes: Yes CBG done?: No Did pt. bring  in CBG monitor from home?: No  How often do you need to have someone help you when you read instructions, pamphlets, or other written materials from your doctor or pharmacy?: 1 - Never  Interpreter Needed?: No  Information entered by :: Donne Anon, CMA   Activities of Daily Living    11/17/2022   11:14 AM  In your present state of health, do you have any difficulty performing the following activities:  Hearing? 0  Vision? 0  Difficulty concentrating or making decisions? 0  Walking or climbing stairs? 1  Dressing or bathing? 0  Doing errands, shopping? 0  Preparing Food and eating ? N  Using the Toilet? N  In the past six months, have you accidently leaked urine? Y  Comment some stress incontinence  Do you have problems with loss of bowel control? N  Managing your Medications? N  Managing your Finances? N  Housekeeping or managing your Housekeeping? N    Patient Care Team: Sharlene Dory, DO as PCP - General (Family Medicine)  Indicate any recent Medical Services you may have received from other than [facility] providers in the past year (date may be approximate).     Assessment:   This is a routine wellness examination for Rhonna.  Hearing/Vision screen No results found.  Dietary issues and exercise activities discussed:     Goals Addressed   None    Depression  Screen    11/17/2022   11:23 AM 04/24/2022    1:26 PM 01/23/2022   11:05 AM 10/15/2021    9:10 AM 10/24/2020   10:40 AM 10/05/2020   10:10 AM 08/16/2020    1:43 PM  PHQ 2/9 Scores  PHQ - 2 Score 1 0 0 0 4 0 1  PHQ- 9 Score    2 7      Fall Risk    11/17/2022   11:17 AM 04/24/2022    1:26 PM 01/23/2022   11:05 AM 10/15/2021    9:10 AM 10/24/2020   10:40 AM  Fall Risk   Falls in the past year? 0 0 0 0 0  Number falls in past yr: 0 0 0 0 0  Injury with Fall? 0 0 0 0 0  Risk for fall due to : History of fall(s) No Fall Risks No Fall Risks No Fall Risks No Fall Risks  Follow up Falls evaluation completed Falls evaluation completed Falls evaluation completed Falls evaluation completed Falls evaluation completed    MEDICARE RISK AT HOME:  Medicare Risk at Home - 11/17/22 1116     Any stairs in or around the home? Yes    If so, are there any without handrails? No    Home free of loose throw rugs in walkways, pet beds, electrical cords, etc? Yes    Adequate lighting in your home to reduce risk of falls? Yes    Life alert? No    Use of a cane, walker or w/c? Yes    Grab bars in the bathroom? No    Shower chair or bench in shower? No    Elevated toilet seat or a handicapped toilet? No             TIMED UP AND GO:  Was the test performed? No    Cognitive Function:        11/17/2022   11:25 AM  6CIT Screen  What Year? 0 points  What month? 0 points  What time? 0 points  Count back from 20  0 points  Months in reverse 0 points  Repeat phrase 0 points  Total Score 0 points    Immunizations Immunization History  Administered Date(s) Administered   COVID-19, mRNA, vaccine(Comirnaty)12 years and older 03/19/2022   Influenza,inj,Quad PF,6+ Mos 04/09/2016, 02/02/2019, 02/20/2021   Influenza-Unspecified 02/03/2019, 01/30/2020, 03/19/2022   Janssen (J&J) SARS-COV-2 Vaccination 08/11/2019   PFIZER(Purple Top)SARS-COV-2 Vaccination 03/15/2020   Pfizer Covid-19 Vaccine Bivalent  Booster 3yrs & up 03/05/2021   Pneumococcal Polysaccharide-23 03/03/2016   Tdap 02/25/2019   Zoster Recombinant(Shingrix) 03/19/2022    TDAP status: Up to date  Flu Vaccine status: Up to date  Pneumococcal vaccine status: Up to date  Covid-19 vaccine status: Completed vaccines  Qualifies for Shingles Vaccine? Yes   Zostavax completed No   Shingrix Completed?: No.    Education has been provided regarding the importance of this vaccine. Patient has been advised to call insurance company to determine out of pocket expense if they have not yet received this vaccine. Advised may also receive vaccine at local pharmacy or Health Dept. Verbalized acceptance and understanding.  Screening Tests Health Maintenance  Topic Date Due   Medicare Annual Wellness (AWV)  Never done   OPHTHALMOLOGY EXAM  02/14/2022   Zoster Vaccines- Shingrix (2 of 2) 05/14/2022   INFLUENZA VACCINE  12/04/2022   HEMOGLOBIN A1C  04/04/2023   Diabetic kidney evaluation - eGFR measurement  04/25/2023   Diabetic kidney evaluation - Urine ACR  04/25/2023   MAMMOGRAM  04/21/2024   Colonoscopy  12/09/2026   DTaP/Tdap/Td (2 - Td or Tdap) 02/24/2029   COVID-19 Vaccine  Completed   Hepatitis C Screening  Completed   HIV Screening  Completed   HPV VACCINES  Aged Out   FOOT EXAM  Discontinued   PAP SMEAR-Modifier  Discontinued    Health Maintenance  Health Maintenance Due  Topic Date Due   Medicare Annual Wellness (AWV)  Never done   OPHTHALMOLOGY EXAM  02/14/2022   Zoster Vaccines- Shingrix (2 of 2) 05/14/2022    Colorectal cancer screening: Type of screening: Colonoscopy. Completed 12/09/19. Repeat every 7 years  Mammogram status: Completed 04/21/22. Repeat every year  Bone Density status: not due until age 66  Lung Cancer Screening: (Low Dose CT Chest recommended if Age 65-80 years, 20 pack-year currently smoking OR have quit w/in 15years.) does not qualify.   Additional Screening:  Hepatitis C Screening:  does qualify; Completed 02/25/19  Vision Screening: Recommended annual ophthalmology exams for early detection of glaucoma and other disorders of the eye. Is the patient up to date with their annual eye exam?  Yes  Who is the provider or what is the name of the office in which the patient attends annual eye exams? Dr. Freida Busman If pt is not established with a provider, would they like to be referred to a provider to establish care? No .   Dental Screening: Recommended annual dental exams for proper oral hygiene  Diabetic Foot Exam: Diabetic Foot Exam: Completed 10/03/22  Community Resource Referral / Chronic Care Management: CRR required this visit?  No   CCM required this visit?  No     Plan:     I have personally reviewed and noted the following in the patient's chart:   Medical and social history Use of alcohol, tobacco or illicit drugs  Current medications and supplements including opioid prescriptions. Patient is not currently taking opioid prescriptions. Functional ability and status Nutritional status Physical activity Advanced directives List of other physicians Hospitalizations, surgeries, and ER  visits in previous 12 months Vitals Screenings to include cognitive, depression, and falls Referrals and appointments  In addition, I have reviewed and discussed with patient certain preventive protocols, quality metrics, and best practice recommendations. A written personalized care plan for preventive services as well as general preventive health recommendations were provided to patient.     Donne Anon, CMA   11/17/2022   After Visit Summary: (MyChart) Due to this being a telephonic visit, the after visit summary with patients personalized plan was offered to patient via MyChart   Nurse Notes: None

## 2022-12-12 ENCOUNTER — Ambulatory Visit: Payer: Medicare Other | Admitting: Internal Medicine

## 2022-12-12 ENCOUNTER — Encounter: Payer: Self-pay | Admitting: Internal Medicine

## 2022-12-12 VITALS — BP 122/80 | HR 70 | Ht 66.0 in | Wt 176.0 lb

## 2022-12-12 DIAGNOSIS — E1165 Type 2 diabetes mellitus with hyperglycemia: Secondary | ICD-10-CM | POA: Diagnosis not present

## 2022-12-12 DIAGNOSIS — Z794 Long term (current) use of insulin: Secondary | ICD-10-CM | POA: Diagnosis not present

## 2022-12-12 DIAGNOSIS — E1142 Type 2 diabetes mellitus with diabetic polyneuropathy: Secondary | ICD-10-CM

## 2022-12-12 LAB — POCT GLYCOSYLATED HEMOGLOBIN (HGB A1C): Hemoglobin A1C: 8.4 % — AB (ref 4.0–5.6)

## 2022-12-12 MED ORDER — TRESIBA FLEXTOUCH 100 UNIT/ML ~~LOC~~ SOPN: 20.0000 [IU] | PEN_INJECTOR | Freq: Every day | SUBCUTANEOUS | 11 refills | Status: DC

## 2022-12-12 MED ORDER — PIOGLITAZONE HCL 30 MG PO TABS: 30.0000 mg | ORAL_TABLET | Freq: Every day | ORAL | 3 refills | Status: DC

## 2022-12-12 MED ORDER — PEN NEEDLES 32G X 4 MM MISC: 1.0000 | Freq: Every day | 3 refills | Status: DC

## 2022-12-12 NOTE — Progress Notes (Signed)
Name: Tiffany Estrada  Age/ Sex: 67 y.o., female   MRN/ DOB: [id number], [birth year]     PCP: Sharlene Dory, DO   Reason for Endocrinology Evaluation: Type 2 Diabetes Mellitus  Initial Endocrine Consultative Visit: 10/18/2019    PATIENT IDENTIFIER: Tiffany Estrada is a 67 y.o. female with a past medical history of T2DM, HTN and Dyslipidemia. The patient has followed with Endocrinology clinic since 10/18/2019 for consultative assistance with management of her diabetes.  DIABETIC HISTORY:  Tiffany Estrada was diagnosed with T2DM in 2017, trulicity has been ineffective.  Her hemoglobin A1c has ranged from 7.5% in 2018, peaking at 11.9% in 2020.  On her initial visit to our clinic she had an A1c of 11.2%   , she was on Tresiba, and Metformin. We continued metformin , switched basal insulin to insulin Mix.   Trulicity started 06/2020  Charlies Silvers through PCP in 10/2020   Patient self decrease insulin mix by skipping the second dose of insulin makes February 2023, we switched her to basal insulin and continued metformin, Trulicity, and [place]  with an A1c 6.2% ,but she lost insurance and was without medication with an increase in A1c 12.0% PCP restarted insulin mix, metformin and actos by 04/2022    SUBJECTIVE:   During the last visit (10/03/2022): A1c 10.2%.    Today (12/12/2022): Tiffany Estrada is here for a follow up on diabetes management.  She checks her blood sugars occasionally . The patient has not had hypoglycemic episodes since the last clinic visit.   She has not been on Pioglitazone while on vacation for a month  She forgets to take to take insulin twice daily  She is not on Ozempic   Has occasional nausea and vomiting  Variable bowel movement    HOME DIABETES REGIMEN:  Metformin 500 mg BID  Pioglitazone 30 mg daily  Humulin Mix 20 units BID - 16 units BID  Ozempic 0.5 mg weekly       Statin: yes ACE-I/ARB:  losartan   METER DOWNLOAD SUMMARY:    [id number]    DIABETIC COMPLICATIONS: Microvascular complications:  Neuropathy Denies: CKD , retinopathy Last Eye Exam: Completed 09/03/2019  Macrovascular complications:   Denies: CAD, CVA, PVD   HISTORY:  Past Medical History:  Past Medical History:  Diagnosis Date   Diabetes mellitus type 2 in obese 02/14/2016   Essential hypertension 02/04/2016   History of chicken pox    Hyperlipidemia    Neuromuscular disorder (HCC)    Obesity 02/04/2016   Tobacco abuse 02/14/2016   Past Surgical History:  Past Surgical History:  Procedure Laterality Date   ABDOMINAL HYSTERECTOMY     Fibroids   APPENDECTOMY     CHOLECYSTECTOMY     COLONOSCOPY  over 10 years ago   [place]   Social History:  reports that she has been smoking cigarettes. She has never used smokeless tobacco. She reports that she does not drink alcohol and does not use drugs. Family History:  Family History  Problem Relation Age of Onset   Pancreatitis Mother    Diabetes Mother    Pancreatitis Sister    Pancreatitis Maternal Aunt    Pancreatitis Maternal Uncle    Colon cancer Neg Hx    Colon polyps Neg Hx    Esophageal cancer Neg Hx    Rectal cancer Neg Hx    Stomach cancer Neg Hx      HOME MEDICATIONS: Allergies as of 12/12/2022  Reactions   Latex    Penicillins    Red Dye    Shellfish Allergy    Sulfa Antibiotics         Medication List        Accurate as of December 12, 2022  9:40 AM. If you have any questions, ask your nurse or doctor.          Alcohol Swabs Pads Use as directed   atorvastatin 40 MG tablet Commonly known as: LIPITOR Take 1 tablet (40 mg total) by mouth daily.   cholecalciferol 25 MCG (1000 UNIT) tablet Commonly known as: VITAMIN D3 Take 1,000 Units by mouth daily.   gabapentin 300 MG capsule Commonly known as: NEURONTIN   INSULIN SYRINGE .3CC/30GX5/16" 30G X 5/16" 0.3 ML Misc Use with insulin   levocetirizine 5 MG tablet Commonly  known as: XYZAL Take 1 tablet (5 mg total) by mouth every evening.   metFORMIN 500 MG 24 hr tablet Commonly known as: GLUCOPHAGE-XR Take 2 tablets (1,000 mg total) by mouth daily with breakfast. What changed: how much to take   MULTI ADULT GUMMIES PO Take by mouth. Take 1 gummies by mouth daily.   NovoLIN 70/30 Kwikpen (70-30) 100 UNIT/ML KwikPen Generic drug: insulin isophane & regular human KwikPen Inject 20 Units into the skin in the morning and at bedtime. What changed: how much to take   Olmesartan-amLODIPine-HCTZ 40-5-25 MG Tabs Take 1 tablet by mouth daily.   Omega-3 1000 MG Caps Take by mouth.   OneTouch Delica Lancets Fine Misc Use to check sugars twice weekly.   OneTouch Delica Lancets 33G Misc Use twice daily to check blood sugar.  DXE11.9   OneTouch Verio Flex System w/Device Kit Check blood sugars twice daily   OneTouch Verio test strip Generic drug: glucose blood USE 1 TEST STRIP TO CHECK BLOOD SUGAR TWICE A WEEK   Ozempic (0.25 or 0.5 MG/DOSE) 2 MG/3ML Sopn Generic drug: Semaglutide(0.25 or 0.5MG /DOS) Inject 0.5 mg into the skin once a week.   Pen Needles 32G X 4 MM Misc 1 Device by Does not apply route in the morning and at bedtime.   pioglitazone 30 MG tablet Commonly known as: Actos Take 1 tablet (30 mg total) by mouth daily.   terconazole 0.8 % vaginal cream Commonly known as: TERAZOL 3 Place 1 applicator vaginally at bedtime. Apply nightly for three nights.   triamcinolone cream 0.1 % Commonly known as: KENALOG Apply 1 Application topically 2 (two) times daily.         OBJECTIVE:   Vital Signs: BP 122/80 (BP Location: Left Arm, Patient Position: Sitting, Cuff Size: Small)   Pulse 70   Ht 5\' 6"  (1.676 m)   Wt 176 lb (79.8 kg)   SpO2 94%   BMI 28.41 kg/m   Wt Readings from Last 3 Encounters:  12/12/22 176 lb (79.8 kg)  10/03/22 173 lb (78.5 kg)  05/19/22 174 lb (78.9 kg)     Exam: General: Pt appears well and is in NAD   Lungs: Clear with good BS bilat   Heart: RRR   Extremities: No pretibial edema   Neuro: MS is good with appropriate affect, pt is alert and Ox3   DM Foot Exam 10/03/2022   The skin of the feet is intact without sores or ulcerations.callous formation of the 5th toes , Has a right foot lateral skin lesion  The pedal pulses are 2+ on right and 2+ on left. The sensation is decreased to a screening  5.07, 10 gram monofilament on the right     DATA REVIEWED:  Lab Results  Component Value Date   HGBA1C 10.2 (A) 10/03/2022   HGBA1C 12.0 (H) 04/24/2022   HGBA1C 6.2 (A) 06/25/2021    Latest Reference Range & Units 04/24/22 13:55  Sodium 135 - 145 mEq/L 133 (L)  Potassium 3.5 - 5.1 mEq/L 3.8  Chloride 96 - 112 mEq/L 96  CO2 19 - 32 mEq/L 25  Glucose 70 - 99 mg/dL 130 (H)  BUN 6 - 23 mg/dL 18  Creatinine 8.65 - 7.84 mg/dL 6.96  Calcium 8.4 - 29.5 mg/dL 28.4  Alkaline Phosphatase 39 - 117 U/L 108  Albumin 3.5 - 5.2 g/dL 4.1  AST 0 - 37 U/L 18  ALT 0 - 35 U/L 21  Total Protein 6.0 - 8.3 g/dL 7.3  Total Bilirubin 0.2 - 1.2 mg/dL 0.6  GFR >13.24 mL/min 61.97  Total CHOL/HDL Ratio  5  Cholesterol 0 - 200 mg/dL 401  HDL Cholesterol >02.72 mg/dL 53.66  Direct LDL mg/dL 440.3  MICROALB/CREAT RATIO 0.0 - 30.0 mg/g 2.9  NonHDL  152.00  Triglycerides 0.0 - 149.0 mg/dL 474.2 (H)  VLDL 0.0 - 59.5 mg/dL 63.8 (H)    Latest Reference Range & Units 04/24/22 13:55  Creatinine,U mg/dL 756.4  Microalb, Ur 0.0 - 1.9 mg/dL 3.1 (H)  MICROALB/CREAT RATIO 0.0 - 30.0 mg/g 2.9  (H): Data is abnormally high   ASSESSMENT / PLAN / RECOMMENDATIONS:   1) Type 2 Diabetes Mellitus, Poorly controlled, With neuropathic  complications - Most recent A1c of 8.4% %. Goal A1c <7.0 %.    -A1c down from 10.2% to 8.4% -She continues with medication nonadherence, she has not taken pioglitazone a month, she takes Novolin mix on average once daily, she has not taken Ozempic, stating that she forgot to order  it? -She used to be on Farxiga but this came cost prohibitive -I have recommended switching Novolin mix to basal insulin, since she is not taking it twice daily and she continues to use lower dose than previously prescribed -We again emphasized the importance of improving glycemic control to prevent microvascular complications   MEDICATIONS:  Continue metformin 2 tablet daily Take pioglitazone 30 mg daily Stop  Novolin mix 70/30 at 20 units twice daily Start Tresiba 20 units daily    EDUCATION / INSTRUCTIONS: BG monitoring instructions: Patient is instructed to check her blood sugars 2 times a day, before breakfast and supper . Call [facility] if: BG persistently < 70 I reviewed the Rule of 15 for the treatment of hypoglycemia in detail with the patient. Literature supplied.   2) Diabetic complications:  Eye: Does not have known diabetic retinopathy.  Neuro/ Feet: Does have known diabetic peripheral neuropathy .  Renal: Patient does not  have known baseline CKD. She   is not on an ACEI/ARB at present.     F/U in 6 months     Signed electronically by: Lyndle Herrlich, MD  [facility] Endocrinology  [facility] [address] [place] [place], [place] [address] Phone: [phone] FAX: [phone]   CC: Sharlene Dory, DO [address] [place] [address] Phone: [phone]  Fax: [phone]  Return to Endocrinology clinic as below: No future appointments.

## 2022-12-12 NOTE — Patient Instructions (Addendum)
-   Continue   Metformin 500 mg, 2 tablets daily  - Continue Pioglitazone (Actos)  30 mg , 1 tablet daily  - STOP  Novolin 70/30  - Start ( Insulin) Tresiba 20 units once daily     -HOW TO TREAT LOW BLOOD SUGARS (Blood sugar LESS THAN 70 MG/DL) Please follow the RULE OF 15 for the treatment of hypoglycemia treatment (when your (blood sugars are less than 70 mg/dL)   STEP 1: Take 15 grams of carbohydrates when your blood sugar is low, which includes:  3-4 GLUCOSE TABS  OR 3-4 OZ OF JUICE OR REGULAR SODA OR ONE TUBE OF GLUCOSE GEL    STEP 2: RECHECK blood sugar in 15 MINUTES STEP 3: If your blood sugar is still low at the 15 minute recheck --> then, go back to STEP 1 and treat AGAIN with another 15 grams of carbohydrates.

## 2023-03-10 ENCOUNTER — Encounter: Payer: Self-pay | Admitting: Family Medicine

## 2023-03-10 ENCOUNTER — Ambulatory Visit (INDEPENDENT_AMBULATORY_CARE_PROVIDER_SITE_OTHER): Payer: Medicare Other | Admitting: Family Medicine

## 2023-03-10 VITALS — BP 122/68 | HR 100 | Temp 98.0°F | Resp 16 | Ht 66.0 in | Wt 186.2 lb

## 2023-03-10 DIAGNOSIS — J209 Acute bronchitis, unspecified: Secondary | ICD-10-CM

## 2023-03-10 LAB — POCT INFLUENZA A/B

## 2023-03-10 LAB — POCT RAPID STREP A (OFFICE): Rapid Strep A Screen: NEGATIVE

## 2023-03-10 MED ORDER — PREDNISONE 20 MG PO TABS: 40.0000 mg | ORAL_TABLET | Freq: Every day | ORAL | 0 refills | Status: AC

## 2023-03-10 MED ORDER — DOXYCYCLINE HYCLATE 100 MG PO TABS: 100.0000 mg | ORAL_TABLET | Freq: Two times a day (BID) | ORAL | 0 refills | Status: AC

## 2023-03-10 NOTE — Addendum Note (Signed)
Addended by: Kathi Ludwig on: 03/10/2023 02:40 PM   Modules accepted: Orders

## 2023-03-10 NOTE — Progress Notes (Signed)
Chief Complaint  Patient presents with   Cough    Cough     Tiffany Estrada here for URI complaints.  Duration: 2 weeks  Associated symptoms: sinus congestion, sinus pain, rhinorrhea, itchy watery eyes, ear fullness, wheezing, shortness of breath, chest tightness, and coughing Denies: ear pain, ear drainage, sore throat, myalgia, and fevers Treatment to date: Benadryl Sick contacts: No  Past Medical History:  Diagnosis Date   Diabetes mellitus type 2 in obese 02/14/2016   Essential hypertension 02/04/2016   History of chicken pox    Hyperlipidemia    Neuromuscular disorder (HCC)    Obesity 02/04/2016   Tobacco abuse 02/14/2016    Objective BP 122/68 (BP Location: Left Arm, Patient Position: Sitting, Cuff Size: Normal)   Pulse 100   Temp 98 F (36.7 C) (Oral)   Resp 16   Ht 5\' 6"  (1.676 m)   Wt 186 lb 3.2 oz (84.5 kg)   SpO2 95%   BMI 30.05 kg/m  General: Awake, alert, appears stated age HEENT: AT, [place], ears patent b/l and TM's neg, nares patent w/o discharge, pharynx pink and without exudates, MMM, mild ttp over max and frontal sinuses Neck: No masses or asymmetry Heart: RRR Lungs:faint wheezes heard throughout, no accessory muscle use Psych: Age appropriate judgment and insight, normal mood and affect  Acute wheezy bronchitis - Plan: predniSONE (DELTASONE) 20 MG tablet, doxycycline (VIBRA-TABS) 100 MG tablet  Doxy for above sinusitis. 5 d pred burst 40 mg/d. Continue to push fluids, practice good hand hygiene, cover mouth when coughing. F/u prn. If starting to experience fevers, shaking, or worsening shortness of breath, seek immediate care. Pt voiced understanding and agreement to the plan.  [name] [place], DO 03/10/23 1:51 PM

## 2023-03-10 NOTE — Patient Instructions (Signed)
Continue to push fluids, practice good hand hygiene, and cover your mouth if you cough. ? ?If you start having fevers, shaking or shortness of breath, seek immediate care. ? ?OK to take Tylenol 1000 mg (2 extra strength tabs) or 975 mg (3 regular strength tabs) every 6 hours as needed. ? ?Let us know if you need anything. ?

## 2023-03-17 ENCOUNTER — Ambulatory Visit (INDEPENDENT_AMBULATORY_CARE_PROVIDER_SITE_OTHER): Payer: Medicare Other | Admitting: Family Medicine

## 2023-03-17 ENCOUNTER — Encounter: Payer: Self-pay | Admitting: Family Medicine

## 2023-03-17 VITALS — BP 124/78 | HR 91 | Temp 98.0°F | Resp 16 | Ht 66.0 in | Wt 186.0 lb

## 2023-03-17 DIAGNOSIS — E1165 Type 2 diabetes mellitus with hyperglycemia: Secondary | ICD-10-CM | POA: Diagnosis not present

## 2023-03-17 DIAGNOSIS — Z0000 Encounter for general adult medical examination without abnormal findings: Secondary | ICD-10-CM

## 2023-03-17 DIAGNOSIS — I10 Essential (primary) hypertension: Secondary | ICD-10-CM

## 2023-03-17 DIAGNOSIS — Z794 Long term (current) use of insulin: Secondary | ICD-10-CM

## 2023-03-17 NOTE — Patient Instructions (Addendum)
Give [place] 2-3 business days to get the results of your labs back.   Keep the diet clean and stay active.  Please get me a copy of your advanced directive form at your convenience.   Please consider getting your 2nd Shingrix at the pharmacy.   Sleep Hygiene Tips: Do not watch TV or look at screens within 1 hour of going to bed. If you do, make sure there is a blue light filter (nighttime mode) involved. Try to go to bed around the same time every night. Wake up at the same time within 1 hour of regular time. Ex: If you wake up at 7 AM for work, do not sleep past 8 AM on days that you don't work. Do not drink alcohol before bedtime. Do not consume caffeine-containing beverages after noon or within 9 hours of intended bedtime. Get regular exercise/physical activity in your life, but not within 2 hours of planned bedtime. Do not take naps.  Do not eat within 2 hours of planned bedtime. Melatonin, 3-5 mg 30-60 minutes before planned bedtime may be helpful.  The bed should be for sleep or sex only. If after 20-30 minutes you are unable to fall asleep, get up and do something relaxing. Do this until you feel ready to go to sleep again.   Sleep is important to [place] all. Getting good sleep is imperative to adequate functioning during the day. Work with our counselors who are trained to help people obtain quality sleep. Call [phone] to schedule an appointment or if you are curious about insurance coverage/cost.  Let us know if you need anything.

## 2023-03-17 NOTE — Progress Notes (Signed)
Chief Complaint  Patient presents with   Annual Exam    Annual Exam     Well Woman Tiffany Estrada is here for a complete physical.   Her last physical was >1 year ago.  Current diet: in general, a "healthy" diet. Current exercise: some walking. Weight is stable and she confirms fatigue out of ordinary. No LMP recorded. Patient has had a hysterectomy. Seatbelt? Yes Advanced directive? No  Health Maintenance Pap/HPV- N/A Mammogram- Yes Colon cancer screening-Yes Shingrix- due for #2 Tetanus- Yes Hep C screening- Yes HIV screening- Yes  Past Medical History:  Diagnosis Date   Diabetes mellitus type 2 in obese 02/14/2016   Essential hypertension 02/04/2016   History of chicken pox    Hyperlipidemia    Neuromuscular disorder (HCC)    Obesity 02/04/2016   Tobacco abuse 02/14/2016     Past Surgical History:  Procedure Laterality Date   APPENDECTOMY     CHOLECYSTECTOMY     COLONOSCOPY  over 10 years ago   in [place]-normal exam   TOTAL ABDOMINAL HYSTERECTOMY      Medications  Current Outpatient Medications on File Prior to Visit  Medication Sig Dispense Refill   Alcohol Swabs PADS Use as directed 100 each 1   atorvastatin (LIPITOR) 40 MG tablet Take 1 tablet (40 mg total) by mouth daily. 90 tablet 1   Blood Glucose Monitoring Suppl (ONETOUCH VERIO FLEX SYSTEM) w/Device KIT Check blood sugars twice daily 1 kit 0   cholecalciferol (VITAMIN D3) 25 MCG (1000 UNIT) tablet Take 1,000 Units by mouth daily.     doxycycline (VIBRA-TABS) 100 MG tablet Take 1 tablet (100 mg total) by mouth 2 (two) times daily for 7 days. 14 tablet 0   gabapentin (NEURONTIN) 300 MG capsule      glucose blood (ONETOUCH VERIO) test strip USE 1 TEST STRIP TO CHECK BLOOD SUGAR TWICE A WEEK 25 each 0   insulin degludec (TRESIBA FLEXTOUCH) 100 UNIT/ML FlexTouch Pen Inject 20 Units into the skin daily. 15 mL 11   Insulin Pen Needle (PEN NEEDLES) 32G X 4 MM MISC 1 Device by Does not apply route daily  in the afternoon. 100 each 3   levocetirizine (XYZAL) 5 MG tablet Take 1 tablet (5 mg total) by mouth every evening. 30 tablet 2   metFORMIN (GLUCOPHAGE-XR) 500 MG 24 hr tablet Take 2 tablets (1,000 mg total) by mouth daily with breakfast. (Patient taking differently: Take 500 mg by mouth daily with breakfast.) 180 tablet 3   Multiple Vitamins-Minerals (MULTI ADULT GUMMIES PO) Take by mouth. Take 1 gummies by mouth daily.     Olmesartan-amLODIPine-HCTZ 40-5-25 MG TABS Take 1 tablet by mouth daily. 90 tablet 2   Omega-3 1000 MG CAPS Take by mouth.     OneTouch Delica Lancets 33G MISC Use twice daily to check blood sugar.  DXE11.9 100 each 3   ONETOUCH DELICA LANCETS FINE MISC Use to check sugars twice weekly. 200 each 2   pioglitazone (ACTOS) 30 MG tablet Take 1 tablet (30 mg total) by mouth daily. 90 tablet 3   Semaglutide,0.25 or 0.5MG /DOS, (OZEMPIC, 0.25 OR 0.5 MG/DOSE,) 2 MG/3ML SOPN Inject 0.5 mg into the skin once a week. 9 mL 3   terconazole (TERAZOL 3) 0.8 % vaginal cream Place 1 applicator vaginally at bedtime. Apply nightly for three nights. 20 g 0   triamcinolone cream (KENALOG) 0.1 % Apply 1 Application topically 2 (two) times daily. 30 g 0    Allergies Allergies  Allergen Reactions   Latex    Penicillins    Red Dye #40 (Allura Red)    Shellfish Allergy    Sulfa Antibiotics     Review of Systems: Constitutional:  no unexpected weight changes Eye:  no recent significant change in vision Ear/Nose/Mouth/Throat:  Ears:  no recent change in hearing Nose/Mouth/Throat:  no complaints of nasal congestion, no sore throat Cardiovascular: no chest pain Respiratory:  no shortness of breath Gastrointestinal:  no abdominal pain, no change in bowel habits GU:  Female: negative for dysuria or pelvic pain Musculoskeletal/Extremities:  no pain of the joints Integumentary (Skin/Breast):  no abnormal skin lesions reported Neurologic:  no headaches Endocrine:  denies fatigue  Exam BP  124/78 (BP Location: Left Arm, Patient Position: Sitting, Cuff Size: Normal)   Pulse 91   Temp 98 F (36.7 C) (Oral)   Resp 16   Ht 5\' 6"  (1.676 m)   Wt 186 lb (84.4 kg)   SpO2 98%   BMI 30.02 kg/m  General:  well developed, well nourished, in no apparent distress Skin:  no significant moles, warts, or growths Head:  no masses, lesions, or tenderness Eyes:  pupils equal and round, sclera anicteric without injection Ears:  canals without lesions, TMs shiny without retraction, no obvious effusion, no erythema Nose:  nares patent, mucosa normal, and no drainage  Throat/Pharynx:  lips and gingiva without lesion; tongue and uvula midline; non-inflamed pharynx; no exudates or postnasal drainage Neck: neck supple without adenopathy, thyromegaly, or masses Lungs:  clear to auscultation, breath sounds equal bilaterally, no respiratory distress Cardio:  regular rate and rhythm, no LE edema Abdomen:  abdomen soft, nontender; moderate distension, bowel sounds normal; no masses or organomegaly Genital: Defer to GYN Musculoskeletal:  symmetrical muscle groups noted without atrophy or deformity Extremities:  no clubbing, cyanosis, or edema, no deformities, no skin discoloration Neuro:  gait normal; deep tendon reflexes normal and symmetric Psych: well oriented with normal range of affect and appropriate judgment/insight  Assessment and Plan  Well adult exam  Essential hypertension - Plan: CBC, Comprehensive metabolic panel, Lipid panel   Well 67 y.o. female. Counseled on diet and exercise. Advanced directive form provided today.  Shingrix #2 rec'd.  Other orders as above. Follow up in 6 mo. The patient voiced understanding and agreement to the plan.  [name] [place], DO 03/17/23 1:59 PM

## 2023-03-18 LAB — MICROALBUMIN / CREATININE URINE RATIO
Creatinine,U: 148 mg/dL
Microalb Creat Ratio: 0.6 mg/g (ref 0.0–30.0)
Microalb, Ur: 0.9 mg/dL (ref 0.0–1.9)

## 2023-03-18 LAB — COMPREHENSIVE METABOLIC PANEL
ALT: 16 U/L (ref 0–35)
AST: 13 U/L (ref 0–37)
Albumin: 4 g/dL (ref 3.5–5.2)
Alkaline Phosphatase: 88 U/L (ref 39–117)
BUN: 20 mg/dL (ref 6–23)
CO2: 26 meq/L (ref 19–32)
Calcium: 10 mg/dL (ref 8.4–10.5)
Chloride: 101 meq/L (ref 96–112)
Creatinine, Ser: 1.06 mg/dL (ref 0.40–1.20)
GFR: 55.36 mL/min — ABNORMAL LOW (ref >60.00)
Glucose, Bld: 233 mg/dL — ABNORMAL HIGH (ref 70–99)
Potassium: 3.6 meq/L (ref 3.5–5.1)
Sodium: 136 meq/L (ref 135–145)
Total Bilirubin: 0.5 mg/dL (ref 0.2–1.2)
Total Protein: 7 g/dL (ref 6.0–8.3)

## 2023-03-18 LAB — CBC
HCT: 41.1 % (ref 36.0–46.0)
Hemoglobin: 13.7 g/dL (ref 12.0–15.0)
MCHC: 33.2 g/dL (ref 30.0–36.0)
MCV: 91.9 fL (ref 78.0–100.0)
Platelets: 330 10*3/uL (ref 150.0–400.0)
RBC: 4.47 Mil/uL (ref 3.87–5.11)
RDW: 13.2 % (ref 11.5–15.5)
WBC: 10.7 10*3/uL — ABNORMAL HIGH (ref 4.0–10.5)

## 2023-03-18 LAB — LIPID PANEL
Cholesterol: 174 mg/dL (ref 0–200)
HDL: 59.2 mg/dL (ref >39.00)
LDL Cholesterol: 92 mg/dL (ref 0–99)
NonHDL: 115.13
Total CHOL/HDL Ratio: 3
Triglycerides: 118 mg/dL (ref 0.0–149.0)
VLDL: 23.6 mg/dL (ref 0.0–40.0)

## 2023-04-24 ENCOUNTER — Telehealth: Payer: Self-pay

## 2023-04-24 DIAGNOSIS — E1142 Type 2 diabetes mellitus with diabetic polyneuropathy: Secondary | ICD-10-CM

## 2023-04-24 DIAGNOSIS — I10 Essential (primary) hypertension: Secondary | ICD-10-CM

## 2023-04-24 MED ORDER — PIOGLITAZONE HCL 30 MG PO TABS: 30.0000 mg | ORAL_TABLET | Freq: Every day | ORAL | 3 refills | Status: DC

## 2023-04-24 MED ORDER — OLMESARTAN-AMLODIPINE-HCTZ 40-5-25 MG PO TABS: 1.0000 | ORAL_TABLET | Freq: Every day | ORAL | 2 refills | Status: DC

## 2023-04-24 MED ORDER — METFORMIN HCL ER 500 MG PO TB24: 1000.0000 mg | ORAL_TABLET | Freq: Every day | ORAL | 3 refills | Status: DC

## 2023-04-24 NOTE — Telephone Encounter (Signed)
Rx sent  Copied from CRM [id number]. Topic: Clinical - Medication Refill >> Apr 24, 2023 11:10 AM Tiffany H wrote: Most Recent Primary Care Visit:  Provider: Sharlene Dory  Department: [facility]  Visit Type: PHYSICAL  Date: 03/17/2023  Medication: Olmesartan-amLODIPine-HCTZ 40-5-25 MG TABS pioglitazone (ACTOS) 30 MG tablet metFORMIN (GLUCOPHAGE-XR) 500 MG 24 hr tablet   Has the patient contacted their pharmacy? Yes (Agent: If no, request that the patient contact the pharmacy for the refill. If patient does not wish to contact the pharmacy document the reason why and proceed with request.) (Agent: If yes, when and what did the pharmacy advise?)  Is this the correct pharmacy for this prescription? Yes If no, delete pharmacy and type the correct one.  This is the patient's preferred pharmacy:   [facility] [address] [place], [place] - [address] [place] [address] Phone: [phone] Fax: [phone]  Has the prescription been filled recently? No  Is the patient out of the medication? No  Has the patient been seen for an appointment in the last year OR does the patient have an upcoming appointment? Yes  Can we respond through MyChart? Yes  Agent: Please be advised that Rx refills may take up to 3 business days. We ask that you follow-up with your pharmacy.

## 2023-06-15 ENCOUNTER — Telehealth: Payer: Self-pay

## 2023-06-15 ENCOUNTER — Ambulatory Visit: Payer: Medicare Other | Admitting: Internal Medicine

## 2023-06-15 ENCOUNTER — Encounter: Payer: Self-pay | Admitting: Internal Medicine

## 2023-06-15 VITALS — BP 124/72 | HR 94 | Wt 189.4 lb

## 2023-06-15 DIAGNOSIS — Z794 Long term (current) use of insulin: Secondary | ICD-10-CM

## 2023-06-15 DIAGNOSIS — E1165 Type 2 diabetes mellitus with hyperglycemia: Secondary | ICD-10-CM

## 2023-06-15 DIAGNOSIS — E1142 Type 2 diabetes mellitus with diabetic polyneuropathy: Secondary | ICD-10-CM

## 2023-06-15 LAB — POCT GLYCOSYLATED HEMOGLOBIN (HGB A1C): Hemoglobin A1C: 8.2 % — AB (ref 4.0–5.6)

## 2023-06-15 MED ORDER — TIRZEPATIDE 5 MG/0.5ML ~~LOC~~ SOAJ: 5.0000 mg | SUBCUTANEOUS | 3 refills | Status: DC

## 2023-06-15 MED ORDER — PIOGLITAZONE HCL 45 MG PO TABS: 45.0000 mg | ORAL_TABLET | Freq: Every day | ORAL | 3 refills | Status: DC

## 2023-06-15 MED ORDER — METFORMIN HCL ER 500 MG PO TB24: 1000.0000 mg | ORAL_TABLET | Freq: Every day | ORAL | 3 refills | Status: DC

## 2023-06-15 NOTE — Telephone Encounter (Signed)
 Medication Samples have been provided to the patient.  Drug name: Florence Hunt        Strength: 2.5mg         Qty: 1   LOT: [id number]  Exp.Date: 12/28/24  Dosing instructions: Inject once weekly

## 2023-06-15 NOTE — Progress Notes (Signed)
 Name: Tiffany Estrada  Age/ Sex: 68 y.o., female   MRN/ DOB: [id number], 12-26-1955     PCP: Jobe Mulder, DO   Reason for Endocrinology Evaluation: Type 2 Diabetes Mellitus  Initial Endocrine Consultative Visit: 10/18/2019    PATIENT IDENTIFIER: Tiffany Estrada is a 68 y.o. female with a past medical history of T2DM, HTN and Dyslipidemia. The patient has followed with Endocrinology clinic since 10/18/2019 for consultative assistance with management of her diabetes.  DIABETIC HISTORY:  [name] was diagnosed with T2DM in 2017, trulicity  has been ineffective.  Her hemoglobin A1c has ranged from 7.5% in 2018, peaking at 11.9% in 2020.  On her initial visit to our clinic she had an A1c of 11.2%   , she was on Tresiba , and Metformin . We continued metformin  , switched basal insulin  to insulin  Mix.   Trulicity  started 06/2020  Started Farxiga  through PCP in 10/2020   Patient self decrease insulin  mix by skipping the second dose of insulin  makes February 2023, we switched her to basal insulin  and continued metformin , Trulicity , and Farxiga   with an A1c 6.2% ,but she lost insurance and was without medication with an increase in A1c 12.0% PCP restarted insulin  mix, metformin  and actos  by 04/2022  Switched insulin  mix to basal insulin  12/2022  SUBJECTIVE:   During the last visit (12/12/2022): A1c 8.4%.    Today (06/15/2023): Tiffany Estrada is here for a follow up on diabetes management.  She checks her blood sugars occasionally . The patient has not had hypoglycemic episodes since the last clinic visit.  Denies  vomiting but has occasional nausea  Denies diarrhea but has occasional constipation    HOME DIABETES REGIMEN:  Metformin  500 mg BID - takes 1 tab daily  Pioglitazone  30 mg daily  Tresiba  20 units daily    Statin: yes ACE-I/ARB:  losartan     METER DOWNLOAD SUMMARY: 1/12-2/02/2024 Fingerstick Blood Glucose Tests = 4 Average Number Tests/Day = 0 Overall Mean FS  Glucose = 184 Standard Deviation = 80  BG Ranges: Low = 81 High = 251  BG Target % Results: % In target = 50 % Over target = 50 % Under target = 0  Hypoglycemic Events/30 Days: BG < 50 = 0 Episodes of symptomatic severe hypoglycemia = 0     DIABETIC COMPLICATIONS: Microvascular complications:  Neuropathy Denies: CKD , retinopathy Last Eye Exam: Completed 09/03/2019  Macrovascular complications:   Denies: CAD, CVA, PVD   HISTORY:  Past Medical History:  Past Medical History:  Diagnosis Date   Diabetes mellitus type 2 in obese 02/14/2016   Essential hypertension 02/04/2016   History of chicken pox    Hyperlipidemia    Neuromuscular disorder (HCC)    Obesity 02/04/2016   Tobacco abuse 02/14/2016   Past Surgical History:  Past Surgical History:  Procedure Laterality Date   APPENDECTOMY     CHOLECYSTECTOMY     COLONOSCOPY  over 10 years ago   in [place]-normal exam   TOTAL ABDOMINAL HYSTERECTOMY     Social History:  reports that she has been smoking cigarettes. She has never used smokeless tobacco. She reports that she does not drink alcohol  and does not use drugs. Family History:  Family History  Problem Relation Age of Onset   Pancreatitis Mother    Diabetes Mother    Pancreatitis Sister    Pancreatitis Maternal Aunt    Pancreatitis Maternal Uncle    Colon cancer Neg Hx    Colon polyps Neg  Hx    Esophageal cancer Neg Hx    Rectal cancer Neg Hx    Stomach cancer Neg Hx      HOME MEDICATIONS: Allergies as of 06/15/2023       Reactions   Latex    Penicillins    Red Dye #40 (allura Red)    Shellfish Allergy    Sulfa Antibiotics         Medication List        Accurate as of June 15, 2023 12:02 PM. If you have any questions, ask your nurse or doctor.          Alcohol  Swabs  Pads Use as directed   atorvastatin  40 MG tablet Commonly known as: LIPITOR Take 1 tablet (40 mg total) by mouth daily.   cholecalciferol 25 MCG (1000  UNIT) tablet Commonly known as: VITAMIN D3 Take 1,000 Units by mouth daily.   gabapentin 300 MG capsule Commonly known as: NEURONTIN   HumuLIN  70/30 KwikPen (70-30) 100 UNIT/ML KwikPen Generic drug: insulin  isophane & regular human KwikPen SMARTSIG:20 Unit(s) SUB-Q Morning-Night   levocetirizine 5 MG tablet Commonly known as: XYZAL  Take 1 tablet (5 mg total) by mouth every evening.   metFORMIN  500 MG 24 hr tablet Commonly known as: GLUCOPHAGE -XR Take 2 tablets (1,000 mg total) by mouth daily with breakfast. What changed: how much to take   MULTI ADULT GUMMIES PO Take by mouth. Take 1 gummies by mouth daily.   Olmesartan -amLODIPine -HCTZ 40-5-25 MG Tabs Take 1 tablet by mouth daily.   Omega-3 1000 MG Caps Take by mouth.   OneTouch Delica Lancets Fine Misc Use to check sugars twice weekly.   OneTouch Delica Lancets 33G Misc Use twice daily to check blood sugar.  DXE11.9   OneTouch Verio Flex System w/Device Kit Check blood sugars twice daily   OneTouch Verio test strip Generic drug: glucose blood USE 1 TEST STRIP TO CHECK BLOOD SUGAR TWICE A WEEK   Ozempic  (0.25 or 0.5 MG/DOSE) 2 MG/3ML Sopn Generic drug: Semaglutide (0.25 or 0.5MG /DOS) Inject 0.5 mg into the skin once a week.   Pen Needles 32G X 4 MM Misc 1 Device by Does not apply route daily in the afternoon.   pioglitazone  30 MG tablet Commonly known as: Actos  Take 1 tablet (30 mg total) by mouth daily.   terconazole  0.8 % vaginal cream Commonly known as: TERAZOL 3  Place 1 applicator vaginally at bedtime. Apply nightly for three nights.   Tresiba  FlexTouch 100 UNIT/ML FlexTouch Pen Generic drug: insulin  degludec Inject 20 Units into the skin daily.   triamcinolone  cream 0.1 % Commonly known as: KENALOG  Apply 1 Application topically 2 (two) times daily.         OBJECTIVE:   Vital Signs: BP 124/72 (BP Location: Left Arm, Patient Position: Sitting, Cuff Size: Small)   Pulse 94   Wt 189 lb 6.4 oz  (85.9 kg)   SpO2 99%   BMI 30.57 kg/m   Wt Readings from Last 3 Encounters:  06/15/23 189 lb 6.4 oz (85.9 kg)  03/17/23 186 lb (84.4 kg)  03/10/23 186 lb 3.2 oz (84.5 kg)     Exam: General: Pt appears well and is in NAD  Lungs: Clear with good BS bilat   Heart: RRR   Extremities: No pretibial edema   Neuro: MS is good with appropriate affect, pt is alert and Ox3   DM Foot Exam 06/15/2023   The skin of the feet is intact without sores or ulcerations.callous formation of the  5th toes , Has a right foot lateral skin lesion  The pedal pulses are 2+ on right and 2+ on left. The sensation is decreased to a screening 5.07, 10 gram monofilament on the right     DATA REVIEWED:  Lab Results  Component Value Date   HGBA1C 8.2 (A) 06/15/2023   HGBA1C 8.4 (A) 12/12/2022   HGBA1C 10.2 (A) 10/03/2022    Latest Reference Range & Units 03/17/23 14:15  COMPREHENSIVE METABOLIC PANEL  Rpt !  Sodium 135 - 145 mEq/L 136  Potassium 3.5 - 5.1 mEq/L 3.6  Chloride 96 - 112 mEq/L 101  CO2 19 - 32 mEq/L 26  Glucose 70 - 99 mg/dL 161 (H)  BUN 6 - 23 mg/dL 20  Creatinine 0.96 - 0.45 mg/dL 4.09  Calcium  8.4 - 10.5 mg/dL 81.1  Alkaline Phosphatase 39 - 117 U/L 88  Albumin 3.5 - 5.2 g/dL 4.0  AST 0 - 37 U/L 13  ALT 0 - 35 U/L 16  Total Protein 6.0 - 8.3 g/dL 7.0  Total Bilirubin 0.2 - 1.2 mg/dL 0.5  GFR >91.47 mL/min 55.36 (L)  Total CHOL/HDL Ratio  3  Cholesterol 0 - 200 mg/dL 829  HDL Cholesterol >56.21 mg/dL 30.86  LDL (calc) 0 - 99 mg/dL 92  NonHDL  578.46  Triglycerides 0.0 - 149.0 mg/dL 962.9  VLDL 0.0 - 52.8 mg/dL 41.3    Latest Reference Range & Units 03/17/23 14:16  Creatinine,U mg/dL 244.0  Microalb, Ur 0.0 - 1.9 mg/dL 0.9  MICROALB/CREAT RATIO 0.0 - 30.0 mg/g 0.6     ASSESSMENT / PLAN / RECOMMENDATIONS:   1) Type 2 Diabetes Mellitus, Poorly controlled, With neuropathic  complications - Most recent A1c of 8.2% %. Goal A1c <7.0 %.    - Pt continues with  hyperglycemia -She used to be on Farxiga  but this came cost prohibitive -I had switched Novolin  mix to basal insulin  back in 12/2022 because she was taking it once a day, but somehow she continues with insulin  mix ???? - Will increase pioglitazone  - She used to be on Trulicity  but due to insurance issues we had to switch to Ozempic  but didn't take it. Since she is c/o weight gain , we again discussed GLP-1 agonist . She agreed to start Mounjaro, cautioned against G/I side effects   - She was advised to increase metformin   - She was provided with #4 mounjaro pens   MEDICATIONS:  Increase Metformin  2 tablet daily Increase Pioglitazone  30 mg daily Start Tresiba  20 units daily  Start Mounjaro 2.5 mg weekly for 4 weeks, than increase to 5 mg weekly    EDUCATION / INSTRUCTIONS: BG monitoring instructions: Patient is instructed to check her blood sugars 2 times a day, before breakfast and supper . Call [facility] if: BG persistently < 70 I reviewed the Rule of 15 for the treatment of hypoglycemia in detail with the patient. Literature supplied.   2) Diabetic complications:  Eye: Does not have known diabetic retinopathy.  Neuro/ Feet: Does have known diabetic peripheral neuropathy .  Renal: Patient does not  have known baseline CKD. She   is not on an ACEI/ARB at present.     F/U in 6 months     Signed electronically by: Natale Bail, MD  [facility]  [facility] [address] [place] [place], [place] [address] Phone: [phone] FAX: [phone]   CC: Jobe Mulder, DO [address] [place]  [address] Phone: [phone]  Fax: [phone]  Return to Endocrinology clinic as below: No future appointments.

## 2023-06-15 NOTE — Patient Instructions (Addendum)
-   Continue Metformin  500 mg, 2 tablets daily  - Increase Pioglitazone  (Actos )  45 mg , 1 tablet daily  - Continue Tresiba  20 units once daily  - Start Mounjaro 2.5 mg once weekly for a month, than increase to 5 mg weekly     -HOW TO TREAT LOW BLOOD SUGARS (Blood sugar LESS THAN 70 MG/DL) Please follow the RULE OF 15 for the treatment of hypoglycemia treatment (when your (blood sugars are less than 70 mg/dL)   STEP 1: Take 15 grams of carbohydrates when your blood sugar is low, which includes:  3-4 GLUCOSE TABS  OR 3-4 OZ OF JUICE OR REGULAR SODA OR ONE TUBE OF GLUCOSE GEL    STEP 2: RECHECK blood sugar in 15 MINUTES STEP 3: If your blood sugar is still low at the 15 minute recheck --> then, go back to STEP 1 and treat AGAIN with another 15 grams of carbohydrates.

## 2023-06-18 ENCOUNTER — Ambulatory Visit (HOSPITAL_BASED_OUTPATIENT_CLINIC_OR_DEPARTMENT_OTHER)
Admission: RE | Admit: 2023-06-18 | Discharge: 2023-06-18 | Disposition: A | Discharge status: Home / Self Care | Payer: Medicare Other | Source: Ambulatory Visit | Attending: Family Medicine | Admitting: Family Medicine

## 2023-06-18 ENCOUNTER — Encounter (HOSPITAL_BASED_OUTPATIENT_CLINIC_OR_DEPARTMENT_OTHER): Payer: Self-pay

## 2023-06-18 ENCOUNTER — Other Ambulatory Visit (HOSPITAL_BASED_OUTPATIENT_CLINIC_OR_DEPARTMENT_OTHER): Payer: Self-pay | Admitting: Family Medicine

## 2023-06-18 DIAGNOSIS — Z1231 Encounter for screening mammogram for malignant neoplasm of breast: Secondary | ICD-10-CM | POA: Insufficient documentation

## 2023-06-29 DIAGNOSIS — E083213 Diabetes mellitus due to underlying condition with mild nonproliferative diabetic retinopathy with macular edema, bilateral: Secondary | ICD-10-CM | POA: Diagnosis not present

## 2023-07-08 ENCOUNTER — Telehealth: Payer: Self-pay | Admitting: *Deleted

## 2023-07-08 NOTE — Telephone Encounter (Signed)
 Patient was identified as falling into the [place] Measure - Diabetes.   Patient was: Appointment scheduled for lab or office visit for A1c.   Pt is scheduled for 4 month f/u per Endocrinology.

## 2023-07-09 ENCOUNTER — Telehealth: Payer: Self-pay

## 2023-07-09 MED ORDER — TRULICITY 1.5 MG/0.5ML ~~LOC~~ SOAJ: 1.5000 mg | SUBCUTANEOUS | 3 refills | Status: DC

## 2023-07-09 NOTE — Telephone Encounter (Signed)
 Patient unable to afford [facility] and would like to be put on something else.

## 2023-07-09 NOTE — Telephone Encounter (Signed)
 LTDVM with information below

## 2023-09-15 ENCOUNTER — Telehealth: Payer: Self-pay | Admitting: Family Medicine

## 2023-09-15 LAB — HM DIABETES EYE EXAM

## 2023-09-15 NOTE — Telephone Encounter (Signed)
 Sent pt message letting her know we don't cal her from [place] office.

## 2023-09-15 NOTE — Telephone Encounter (Signed)
 Copied from CRM [id number]. Topic: General - Call Back - No Documentation >> Sep 15, 2023  3:53 PM [place] E wrote: Reason for CRM: Patient stated she missed a call today from the office, agent could not locate what this might have been in regards to. Callback number for patient is [phone].

## 2023-09-29 ENCOUNTER — Other Ambulatory Visit: Payer: Self-pay | Admitting: Family Medicine

## 2023-09-29 DIAGNOSIS — E1169 Type 2 diabetes mellitus with other specified complication: Secondary | ICD-10-CM

## 2023-10-14 ENCOUNTER — Encounter: Payer: Self-pay | Admitting: Family Medicine

## 2023-10-14 ENCOUNTER — Ambulatory Visit (INDEPENDENT_AMBULATORY_CARE_PROVIDER_SITE_OTHER): Admitting: Family Medicine

## 2023-10-14 ENCOUNTER — Ambulatory Visit: Payer: Medicare Other | Admitting: Internal Medicine

## 2023-10-14 VITALS — BP 122/70 | HR 87 | Temp 98.0°F | Resp 16 | Ht 66.0 in | Wt 184.6 lb

## 2023-10-14 DIAGNOSIS — L282 Other prurigo: Secondary | ICD-10-CM

## 2023-10-14 DIAGNOSIS — M25561 Pain in right knee: Secondary | ICD-10-CM | POA: Diagnosis not present

## 2023-10-14 DIAGNOSIS — M25571 Pain in right ankle and joints of right foot: Secondary | ICD-10-CM | POA: Diagnosis not present

## 2023-10-14 MED ORDER — TRIAMCINOLONE ACETONIDE 0.1 % EX CREA: 1.0000 | TOPICAL_CREAM | Freq: Two times a day (BID) | CUTANEOUS | 0 refills | Status: AC

## 2023-10-14 MED ORDER — PREDNISONE 20 MG PO TABS: 40.0000 mg | ORAL_TABLET | Freq: Every day | ORAL | 0 refills | Status: AC

## 2023-10-14 NOTE — Patient Instructions (Signed)
Give us 2-3 business days to get the results of your labs back.   Foods to AVOID: Red meat, organ meat (liver), lunch meat, seafood (mussels, scallops, anchovies, etc) Alcohol Sugary foods/beverages (diet soft drinks have no link to flares)  Foods to migrate to: Dairy Vegetables Cherries have limited data to suggest they help lower uric acid levels (and prevent flares) Vit C (500 mg daily) may have a modest effect with preventing flares Poultry If you are going to eat red meat, beef and pork may give you less problems than lamb.  Let us know if you need anything. 

## 2023-10-14 NOTE — Progress Notes (Signed)
 Musculoskeletal Exam  Patient: Tiffany Estrada DOB: 1956/02/14  DOS: 10/14/2023  SUBJECTIVE:  Chief Complaint:   Chief Complaint  Patient presents with   Foot Swelling    Foot Swelling    Tiffany Estrada is a 68 y.o.  female for evaluation and treatment of R foot/knee pain.   Onset:  1 week ago. No inj or change in activity.  Location: R ankle, anterior R knee Character:  aching and sharp  Progression of issue:  has worsened Associated symptoms: Swelling, limping No bruising, redness, catching/locking Treatment: to date has been ice, OTC NSAIDS, and heat.   Neurovascular symptoms: no Possible hx of gout.   Past Medical History:  Diagnosis Date   Diabetes mellitus type 2 in obese 02/14/2016   Essential hypertension 02/04/2016   History of chicken pox    Hyperlipidemia    Neuromuscular disorder (HCC)    Obesity 02/04/2016   Tobacco abuse 02/14/2016    Objective: VITAL SIGNS: BP 122/70 (BP Location: Left Arm, Patient Position: Sitting)   Pulse 87   Temp 98 F (36.7 C) (Oral)   Resp 16   Ht 5' 6 (1.676 m)   Wt 184 lb 9.6 oz (83.7 kg)   SpO2 97%   BMI 29.80 kg/m  Constitutional: Well formed, well developed. No acute distress. Thorax & Lungs: No accessory muscle use Musculoskeletal: R knee/foot.   Normal active range of motion: yes.   Normal passive range of motion: yes Tenderness to palpation: yes circumferentially around the ankle and anterior knee Edema noted of the right ankle. No effusion or edema noted of the right knee. Deformity: no Ecchymosis: no Tests positive: none Tests negative: anterior drawer of ankle, squeeze; Lachman's, McMurray's, varus/valgus stress, patellar apprehension/grind Neurologic: Normal sensory function.  Antalgic gait. Psychiatric: Normal mood. Age appropriate judgment and insight. Alert & oriented x 3.    Assessment:  Acute pain of right knee - Plan: predniSONE  (DELTASONE ) 20 MG tablet, Uric acid  Pruritic rash - Plan:  triamcinolone  cream (KENALOG ) 0.1 %  Acute right ankle pain - Plan: predniSONE  (DELTASONE ) 20 MG tablet, Uric acid  Plan: 5-day prednisone  burst 40 mg daily.  Suspect possible gout given her history.  Heat, ice, Tylenol .  F/u as originally scheduled. The patient voiced understanding and agreement to the plan.   Shellie Dials [place], DO 10/14/23  2:47 PM

## 2023-10-15 ENCOUNTER — Ambulatory Visit: Payer: Self-pay | Admitting: Family Medicine

## 2023-10-15 LAB — URIC ACID: Uric Acid, Serum: 6.4 mg/dL (ref 2.5–7.0)

## 2023-11-11 ENCOUNTER — Encounter: Payer: Self-pay | Admitting: Internal Medicine

## 2023-11-11 ENCOUNTER — Ambulatory Visit: Admitting: Internal Medicine

## 2023-11-11 VITALS — BP 110/70 | HR 86 | Ht 66.0 in | Wt 185.0 lb

## 2023-11-11 DIAGNOSIS — Z794 Long term (current) use of insulin: Secondary | ICD-10-CM | POA: Diagnosis not present

## 2023-11-11 DIAGNOSIS — E1165 Type 2 diabetes mellitus with hyperglycemia: Secondary | ICD-10-CM | POA: Diagnosis not present

## 2023-11-11 DIAGNOSIS — E1142 Type 2 diabetes mellitus with diabetic polyneuropathy: Secondary | ICD-10-CM | POA: Diagnosis not present

## 2023-11-11 LAB — POCT GLYCOSYLATED HEMOGLOBIN (HGB A1C): Hemoglobin A1C: 7.5 % — AB (ref 4.0–5.6)

## 2023-11-11 LAB — POCT GLUCOSE (DEVICE FOR HOME USE): POC Glucose: 301 mg/dL — AB (ref 70–99)

## 2023-11-11 MED ORDER — TRULICITY 1.5 MG/0.5ML ~~LOC~~ SOAJ: 1.5000 mg | SUBCUTANEOUS | 3 refills | Status: DC

## 2023-11-11 MED ORDER — FREESTYLE LIBRE 3 PLUS SENSOR MISC: 1.0000 | 3 refills | Status: AC

## 2023-11-11 MED ORDER — TRESIBA FLEXTOUCH 100 UNIT/ML ~~LOC~~ SOPN: 24.0000 [IU] | PEN_INJECTOR | Freq: Every day | SUBCUTANEOUS | 6 refills | Status: DC

## 2023-11-11 MED ORDER — METFORMIN HCL ER 500 MG PO TB24: 1000.0000 mg | ORAL_TABLET | Freq: Every day | ORAL | 3 refills | Status: DC

## 2023-11-11 MED ORDER — PIOGLITAZONE HCL 30 MG PO TABS: 30.0000 mg | ORAL_TABLET | Freq: Every day | ORAL | 3 refills | Status: DC

## 2023-11-11 NOTE — Patient Instructions (Addendum)
-   Continue Metformin  500 mg, 2 tablets daily  - Decrease   Pioglitazone  (Actos )  30 mg , 1 tablet daily  - Increase Tresiba  24 units once daily  - Continue Trulicity  1.5 mg weekly     -HOW TO TREAT LOW BLOOD SUGARS (Blood sugar LESS THAN 70 MG/DL) Please follow the RULE OF 15 for the treatment of hypoglycemia treatment (when your (blood sugars are less than 70 mg/dL)   STEP 1: Take 15 grams of carbohydrates when your blood sugar is low, which includes:  3-4 GLUCOSE TABS  OR 3-4 OZ OF JUICE OR REGULAR SODA OR ONE TUBE OF GLUCOSE GEL    STEP 2: RECHECK blood sugar in 15 MINUTES STEP 3: If your blood sugar is still low at the 15 minute recheck --> then, go back to STEP 1 and treat AGAIN with another 15 grams of carbohydrates.

## 2023-11-11 NOTE — Progress Notes (Signed)
 Name: Tiffany Estrada  Age/ Sex: 68 y.o., female   MRN/ DOB: [id number], June 10, 1955     PCP: Frann Mabel Mt, DO   Reason for Endocrinology Evaluation: Type 2 Diabetes Mellitus  Initial Endocrine Consultative Visit: 10/18/2019    PATIENT IDENTIFIER: Tiffany Estrada is a 68 y.o. female with a past medical history of T2DM, HTN and Dyslipidemia. The patient has followed with Endocrinology clinic since 10/18/2019 for consultative assistance with management of her diabetes.  DIABETIC HISTORY:  Ms. Urbieta was diagnosed with T2DM in 2017, trulicity  has been ineffective.  Her hemoglobin A1c has ranged from 7.5% in 2018, peaking at 11.9% in 2020.  On her initial visit to our clinic she had an A1c of 11.2%   , she was on Tresiba , and Metformin . We continued metformin  , switched basal insulin  to insulin  Mix.   Trulicity  started 06/2020  Started Farxiga  through PCP in 10/2020   Patient self decrease insulin  mix by skipping the second dose of insulin  makes February 2023, we switched her to basal insulin  and continued metformin , Trulicity , and Farxiga   with an A1c 6.2% ,but she lost insurance and was without medication with an increase in A1c 12.0% PCP restarted insulin  mix, metformin  and actos  by 04/2022  Switched insulin  mix to basal insulin  12/2022  SUBJECTIVE:   During the last visit (06/15/2023): A1c 8.2%.    Today (11/11/2023): Ms. Latouche is here for a follow up on diabetes management.  She checks her blood sugars occasionally .     Has occasional nausea but no vomiting  No heartburn  Denies diarrhea and continues with  occasional constipation    HOME DIABETES REGIMEN:  Metformin  500 mg, 2 tablets daily Pioglitazone  45 mg daily Tresiba  20 units daily Trulicity  1.5 mg weekly  Statin: yes ACE-I/ARB:  losartan     METER DOWNLOAD SUMMARY: 1/12-2/02/2024 Fingerstick Blood Glucose Tests = 4 Average Number Tests/Day = 0 Overall Mean FS Glucose = 184 Standard Deviation =  80  BG Ranges: Low = 81 Hi    DIABETIC COMPLICATIONS: Microvascular complications:  Neuropathy Denies: CKD , retinopathy Last Eye Exam: Completed 06/2023  Macrovascular complications:   Denies: CAD, CVA, PVD   HISTORY:  Past Medical History:  Past Medical History:  Diagnosis Date   Diabetes mellitus type 2 in obese 02/14/2016   Essential hypertension 02/04/2016   History of chicken pox    Hyperlipidemia    Neuromuscular disorder (HCC)    Obesity 02/04/2016   Tobacco abuse 02/14/2016   Past Surgical History:  Past Surgical History:  Procedure Laterality Date   APPENDECTOMY     CHOLECYSTECTOMY     COLONOSCOPY  over 10 years ago   in [place]-normal exam   TOTAL ABDOMINAL HYSTERECTOMY     Social History:  reports that she has been smoking cigarettes. She has never used smokeless tobacco. She reports that she does not drink alcohol  and does not use drugs. Family History:  Family History  Problem Relation Age of Onset   Pancreatitis Mother    Diabetes Mother    Pancreatitis Sister    Pancreatitis Maternal Aunt    Pancreatitis Maternal Uncle    Colon cancer Neg Hx    Colon polyps Neg Hx    Esophageal cancer Neg Hx    Rectal cancer Neg Hx    Stomach cancer Neg Hx      HOME MEDICATIONS: Allergies as of 11/11/2023       Reactions   Latex    Penicillins  Red Dye #40 (allura Red)    Shellfish Allergy    Sulfa Antibiotics         Medication List        Accurate as of November 11, 2023  2:19 PM. If you have any questions, ask your nurse or doctor.          Alcohol  Swabs  Pads Use as directed   atorvastatin  40 MG tablet Commonly known as: LIPITOR Take 1 tablet (40 mg total) by mouth daily.   cholecalciferol 25 MCG (1000 UNIT) tablet Commonly known as: VITAMIN D3 Take 1,000 Units by mouth daily.   gabapentin 300 MG capsule Commonly known as: NEURONTIN   levocetirizine 5 MG tablet Commonly known as: XYZAL  Take 1 tablet (5 mg total) by mouth  every evening.   metFORMIN  500 MG 24 hr tablet Commonly known as: GLUCOPHAGE -XR Take 2 tablets (1,000 mg total) by mouth daily with breakfast.   MULTI ADULT GUMMIES PO Take by mouth. Take 1 gummies by mouth daily.   Olmesartan -amLODIPine -HCTZ 40-5-25 MG Tabs Take 1 tablet by mouth daily.   Omega-3 1000 MG Caps Take by mouth.   OneTouch Delica Lancets Fine Misc Use to check sugars twice weekly.   OneTouch Delica Lancets 33G Misc Use twice daily to check blood sugar.  DXE11.9   OneTouch Verio Flex System w/Device Kit Check blood sugars twice daily   OneTouch Verio test strip Generic drug: glucose blood USE 1 TEST STRIP TO CHECK BLOOD SUGAR TWICE WEEKLY   Pen Needles 32G X 4 MM Misc 1 Device by Does not apply route daily in the afternoon.   pioglitazone  45 MG tablet Commonly known as: ACTOS  Take 1 tablet (45 mg total) by mouth daily.   terconazole  0.8 % vaginal cream Commonly known as: TERAZOL 3  Place 1 applicator vaginally at bedtime. Apply nightly for three nights.   Tresiba  FlexTouch 100 UNIT/ML FlexTouch Pen Generic drug: insulin  degludec Inject 20 Units into the skin daily.   triamcinolone  cream 0.1 % Commonly known as: KENALOG  Apply 1 Application topically 2 (two) times daily.   Trulicity  1.5 MG/0.5ML Soaj Generic drug: Dulaglutide  Inject 1.5 mg into the skin once a week.         OBJECTIVE:   Vital Signs: BP 110/70 (BP Location: Left Arm, Patient Position: Sitting, Cuff Size: Normal)   Pulse 86   Ht 5' 6 (1.676 m)   Wt 185 lb (83.9 kg)   SpO2 98%   BMI 29.86 kg/m   Wt Readings from Last 3 Encounters:  11/11/23 185 lb (83.9 kg)  10/14/23 184 lb 9.6 oz (83.7 kg)  06/15/23 189 lb 6.4 oz (85.9 kg)     Exam: General: Pt appears well and is in NAD  Lungs: Clear with good BS bilat   Heart: RRR   Extremities: Trace pretibial edema   Neuro: MS is good with appropriate affect, pt is alert and Ox3   DM Foot Exam 06/15/2023   The skin of the  feet is intact without sores or ulcerations.callous formation of the 5th toes , Has a right foot lateral skin lesion  The pedal pulses are 2+ on right and 2+ on left. The sensation is decreased to a screening 5.07, 10 gram monofilament on the right     DATA REVIEWED:  Lab Results  Component Value Date   HGBA1C 7.5 (A) 11/11/2023   HGBA1C 8.2 (A) 06/15/2023   HGBA1C 8.4 (A) 12/12/2022    Latest Reference Range & Units 03/17/23 14:15  COMPREHENSIVE METABOLIC PANEL  Rpt !  Sodium 135 - 145 mEq/L 136  Potassium 3.5 - 5.1 mEq/L 3.6  Chloride 96 - 112 mEq/L 101  CO2 19 - 32 mEq/L 26  Glucose 70 - 99 mg/dL 766 (H)  BUN 6 - 23 mg/dL 20  Creatinine 9.59 - 8.79 mg/dL 8.93  Calcium  8.4 - 10.5 mg/dL 89.9  Alkaline Phosphatase 39 - 117 U/L 88  Albumin 3.5 - 5.2 g/dL 4.0  AST 0 - 37 U/L 13  ALT 0 - 35 U/L 16  Total Protein 6.0 - 8.3 g/dL 7.0  Total Bilirubin 0.2 - 1.2 mg/dL 0.5  GFR >39.99 mL/min 55.36 (L)  Total CHOL/HDL Ratio  3  Cholesterol 0 - 200 mg/dL 825  HDL Cholesterol >60.99 mg/dL 40.79  LDL (calc) 0 - 99 mg/dL 92  NonHDL  884.86  Triglycerides 0.0 - 149.0 mg/dL 881.9  VLDL 0.0 - 59.9 mg/dL 76.3    Latest Reference Range & Units 03/17/23 14:16  Creatinine,U mg/dL 851.9  Microalb, Ur 0.0 - 1.9 mg/dL 0.9  MICROALB/CREAT RATIO 0.0 - 30.0 mg/g 0.6   In office BG 301 Mg/DL  ASSESSMENT / PLAN / RECOMMENDATIONS:   1) Type 2 Diabetes Mellitus, suboptimally controlled, With neuropathic  complications - Most recent A1c of 7.5% %. Goal A1c <7.0 %.    - A1c is trending down -SGLT2 inhibitors have been cost prohibitive - Insurance did not cover Mounjaro , she is on Trulicity , unable to increase due to nausea in the mornings - Will increase insulin  as below - Will decrease the dose of pioglitazone  due to lower extremity edema - A prescription for freestyle libre 3+ has been sent to the pharmacy  MEDICATIONS:  Continue metformin  2 tablet daily Decrease pioglitazone  30 mg  daily Increase Tresiba  24 units daily  Continue Trulicity  1.5 mg weekly   EDUCATION / INSTRUCTIONS: BG monitoring instructions: Patient is instructed to check her blood sugars 2 times a day, before breakfast and supper . Call [place] [facility] clinic if: BG persistently < 70 I reviewed the Rule of 15 for the treatment of hypoglycemia in detail with the patient. Literature supplied.   2) Diabetic complications:  Eye: Does not have known diabetic retinopathy.  Neuro/ Feet: Does have known diabetic peripheral neuropathy .  Renal: Patient does not  have known baseline CKD. She   is not on an ACEI/ARB at present.     F/U in 4 months     Signed electronically by: Stefano Redgie Butts, MD  [facility] Endocrin[facility]  [facility] [address] [place] [place], [place] [address] Phone: [phone] FAX: [phone]   CC: Frann Mabel Mt, DO [address] [place] [address] Phone: [phone]  Fax: [phone]  Return to Endocrinology clinic as below: Future Appointments  Date Time Provider Department Center  01/12/2024 10:10 AM [facility] ANNUAL WELLNESS VISIT 2 [facility]

## 2024-01-12 ENCOUNTER — Ambulatory Visit (INDEPENDENT_AMBULATORY_CARE_PROVIDER_SITE_OTHER)

## 2024-01-12 VITALS — Ht 66.0 in | Wt 185.0 lb

## 2024-01-12 DIAGNOSIS — Z0000 Encounter for general adult medical examination without abnormal findings: Secondary | ICD-10-CM

## 2024-01-12 NOTE — Patient Instructions (Addendum)
 Ms. Gunnerson,  Thank you for taking the time for your Medicare Wellness Visit. I appreciate your continued commitment to your health goals. Please review the care plan we discussed, and feel free to reach out if I can assist you further.  Medicare recommends these wellness visits once per year to help you and your care team stay ahead of potential health issues. These visits are designed to focus on prevention, allowing your provider to concentrate on managing your acute and chronic conditions during your regular appointments.  Please note that Annual Wellness Visits do not include a physical exam. Some assessments may be limited, especially if the visit was conducted virtually. If needed, we may recommend a separate in-person follow-up with your provider.  Ongoing Care Seeing your primary care provider every 3 to 6 months helps us  monitor your health and provide consistent, personalized care.   Referrals If a referral was made during today's visit and you haven't received any updates within two weeks, please contact the referred provider directly to check on the status.  Recommended Screenings:  Health Maintenance  Topic Date Due   Yearly kidney health urinalysis for diabetes  Never done   Pneumococcal Vaccine for age over 67 (2 of 2 - PCV) 03/03/2017   Zoster (Shingles) Vaccine (2 of 2) 05/14/2022   DEXA scan (bone density measurement)  Never done   Flu Shot  12/04/2023   COVID-19 Vaccine (6 - 2024-25 season) 01/04/2024   Yearly kidney function blood test for diabetes  03/16/2024   Hemoglobin A1C  05/13/2024   Eye exam for diabetics  09/14/2024   Medicare Annual Wellness Visit  01/11/2025   Mammogram  06/17/2025   Colon Cancer Screening  12/09/2026   DTaP/Tdap/Td vaccine (2 - Td or Tdap) 02/24/2029   Hepatitis C Screening  Completed   HIV Screening  Completed   Hepatitis B Vaccine  Aged Out   HPV Vaccine  Aged Out   Meningitis B Vaccine  Aged Out   Complete foot exam    Discontinued       01/12/2024   10:09 AM  Advanced Directives  Does Patient Have a Medical Advance Directive? No  Would patient like information on creating a medical advance directive? No - Patient declined   Advance Care Planning is important because it: Ensures you receive medical care that aligns with your values, goals, and preferences. Provides guidance to your family and loved ones, reducing the emotional burden of decision-making during critical moments.  Vision: Annual vision screenings are recommended for early detection of glaucoma, cataracts, and diabetic retinopathy. These exams can also reveal signs of chronic conditions such as diabetes and high blood pressure.  Dental: Annual dental screenings help detect early signs of oral cancer, gum disease, and other conditions linked to overall health, including heart disease and diabetes.  Please see the attached documents for additional preventive care recommendations.

## 2024-01-12 NOTE — Progress Notes (Signed)
 Subjective:   Tiffany Estrada is a 68 y.o. who presents for a Medicare Wellness preventive visit.  As a reminder, Annual Wellness Visits don't include a physical exam, and some assessments may be limited, especially if this visit is performed virtually. We may recommend an in-person follow-up visit with your provider if needed.  Visit Complete: Virtual I connected with  Tiffany Estrada on 01/12/24 by a audio enabled telemedicine application and verified that I am speaking with the correct person using two identifiers.  Patient Location: Home  Provider Location: Home Office  I discussed the limitations of evaluation and management by telemedicine. The patient expressed understanding and agreed to proceed.  Vital Signs: Because this visit was a virtual/telehealth visit, some criteria may be missing or patient reported. Any vitals not documented were not able to be obtained and vitals that have been documented are patient reported.    Persons Participating in Visit: Patient.  AWV Questionnaire: No: Patient Medicare AWV questionnaire was not completed prior to this visit.  Cardiac Risk Factors include: advanced age (>17men, >48 women);diabetes mellitus;hypertension     Objective:    Today's Vitals   01/12/24 1001  Weight: 185 lb (83.9 kg)  Height: 5' 6 (1.676 m)   Body mass index is 29.86 kg/m.     01/12/2024   10:09 AM 11/17/2022   11:23 AM 03/09/2022    9:32 AM 08/16/2020    1:43 PM 06/27/2017    1:12 PM 01/26/2016    8:34 AM 12/29/2014   11:05 AM  Advanced Directives  Does Patient Have a Medical Advance Directive? No No No No No  No  No   Would patient like information on creating a medical advance directive? No - Patient declined No - Patient declined No - Patient declined No - Patient declined  No - patient declined information  No - patient declined information      Data saved with a previous flowsheet row definition    Current Medications (verified) Outpatient  Encounter Medications as of 01/12/2024  Medication Sig   Alcohol  Swabs  PADS Use as directed   atorvastatin  (LIPITOR) 40 MG tablet Take 1 tablet (40 mg total) by mouth daily.   Blood Glucose Monitoring Suppl (ONETOUCH VERIO FLEX SYSTEM) w/Device KIT Check blood sugars twice daily   cholecalciferol (VITAMIN D3) 25 MCG (1000 UNIT) tablet Take 1,000 Units by mouth daily.   Continuous Glucose Sensor (FREESTYLE LIBRE 3 PLUS SENSOR) MISC 1 Device by Other route every 14 (fourteen) days. Change sensor every 15 days.   Dulaglutide  (TRULICITY ) 1.5 MG/0.5ML SOAJ Inject 1.5 mg into the skin once a week.   gabapentin (NEURONTIN) 300 MG capsule    glucose blood (ONETOUCH VERIO) test strip USE 1 TEST STRIP TO CHECK BLOOD SUGAR TWICE WEEKLY   insulin  degludec (TRESIBA  FLEXTOUCH) 100 UNIT/ML FlexTouch Pen Inject 24 Units into the skin daily.   Insulin  Pen Needle (PEN NEEDLES) 32G X 4 MM MISC 1 Device by Does not apply route daily in the afternoon.   levocetirizine (XYZAL ) 5 MG tablet Take 1 tablet (5 mg total) by mouth every evening.   metFORMIN  (GLUCOPHAGE -XR) 500 MG 24 hr tablet Take 2 tablets (1,000 mg total) by mouth daily with breakfast.   Multiple Vitamins-Minerals (MULTI ADULT GUMMIES PO) Take by mouth. Take 1 gummies by mouth daily.   Olmesartan -amLODIPine -HCTZ 40-5-25 MG TABS Take 1 tablet by mouth daily.   Omega-3 1000 MG CAPS Take by mouth.   OneTouch Delica Lancets 33G MISC Use  twice daily to check blood sugar.  DXE11.9   ONETOUCH DELICA LANCETS FINE MISC Use to check sugars twice weekly.   pioglitazone  (ACTOS ) 30 MG tablet Take 1 tablet (30 mg total) by mouth daily.   terconazole  (TERAZOL 3 ) 0.8 % vaginal cream Place 1 applicator vaginally at bedtime. Apply nightly for three nights.   triamcinolone  cream (KENALOG ) 0.1 % Apply 1 Application topically 2 (two) times daily.   No facility-administered encounter medications on file as of 01/12/2024.    Allergies (verified) Latex, Penicillins, Red dye  #40 (allura red), Shellfish allergy, and Sulfa antibiotics   History: Past Medical History:  Diagnosis Date   Diabetes mellitus type 2 in obese 02/14/2016   Essential hypertension 02/04/2016   History of chicken pox    Hyperlipidemia    Neuromuscular disorder (HCC)    Obesity 02/04/2016   Tobacco abuse 02/14/2016   Past Surgical History:  Procedure Laterality Date   APPENDECTOMY     CHOLECYSTECTOMY     COLONOSCOPY  over 10 years ago   in [place]-normal exam   TOTAL ABDOMINAL HYSTERECTOMY     Family History  Problem Relation Age of Onset   Pancreatitis Mother    Diabetes Mother    Pancreatitis Sister    Pancreatitis Maternal Aunt    Pancreatitis Maternal Uncle    Colon cancer Neg Hx    Colon polyps Neg Hx    Esophageal cancer Neg Hx    Rectal cancer Neg Hx    Stomach cancer Neg Hx    Social History   Socioeconomic History   Marital status: Married    Spouse name: Not on file   Number of children: Not on file   Years of education: Not on file   Highest education level: Not on file  Occupational History   Not on file  Tobacco Use   Smoking status: Some Days    Current packs/day: 0.25    Types: Cigarettes   Smokeless tobacco: Never   Tobacco comments:    3 cigarettes oer day  Vaping Use   Vaping status: Never Used  Substance and Sexual Activity   Alcohol  use: No    Alcohol /week: 0.0 standard drinks of alcohol    Drug use: No   Sexual activity: Not on file  Other Topics Concern   Not on file  Social History Narrative   Not on file   Social Drivers of Health   Financial Resource Strain: Low Risk  (01/12/2024)   Overall Financial Resource Strain (CARDIA)    Difficulty of Paying Living Expenses: Not hard at all  Food Insecurity: No Food Insecurity (01/12/2024)   Hunger Vital Sign    Worried About Running Out of Food in the Last Year: Never true    Ran Out of Food in the Last Year: Never true  Transportation Needs: No Transportation Needs (01/12/2024)    PRAPARE - Administrator, Civil Service (Medical): No    Lack of Transportation (Non-Medical): No  Physical Activity: Insufficiently Active (01/12/2024)   Exercise Vital Sign    Days of Exercise per Week: 7 days    Minutes of Exercise per Session: 10 min  Stress: No Stress Concern Present (01/12/2024)   [employer] of Occupational Health - Occupational Stress Questionnaire    Feeling of Stress: Not at all  Social Connections: Socially Integrated (01/12/2024)   Social Connection and Isolation Panel    Frequency of Communication with Friends and Family: More than three times a week  Frequency of Social Gatherings with Friends and Family: More than three times a week    Attends Religious Services: More than 4 times per year    Active Member of Clubs or Organizations: Yes    Attends Engineer, structural: More than 4 times per year    Marital Status: Married    Tobacco Counseling Ready to quit: Yes Counseling given: Yes Tobacco comments: 3 cigarettes oer day    Clinical Intake:  Pre-visit preparation completed: Yes  Pain : No/denies pain     BMI - recorded: 29.86 Nutritional Status: BMI 25 -29 Overweight Nutritional Risks: None Diabetes: Yes CBG done?: No Did pt. bring in CBG monitor from home?: No  Lab Results  Component Value Date   HGBA1C 7.5 (A) 11/11/2023   HGBA1C 8.2 (A) 06/15/2023   HGBA1C 8.4 (A) 12/12/2022     How often do you need to have someone help you when you read instructions, pamphlets, or other written materials from your doctor or pharmacy?: 1 - Never  Interpreter Needed?: No  Information entered by :: Rojelio Blush LPN   Activities of Daily Living     01/12/2024   10:08 AM  In your present state of health, do you have any difficulty performing the following activities:  Hearing? 0  Vision? 0  Difficulty concentrating or making decisions? 0  Walking or climbing stairs? 1  Comment Uses a Cane  Dressing or bathing? 0   Doing errands, shopping? 0  Preparing Food and eating ? N  Using the Toilet? N  In the past six months, have you accidently leaked urine? N  Do you have problems with loss of bowel control? N  Managing your Medications? N  Managing your Finances? N  Housekeeping or managing your Housekeeping? N    Patient Care Team: Frann Mabel Mt, DO as PCP - General (Family Medicine)  I have updated your Care Teams any recent Medical Services you may have received from other providers in the past year.     Assessment:   This is a routine wellness examination for Tiffany Estrada.  Hearing/Vision screen Hearing Screening - Comments:: Denies hearing difficulties   Vision Screening - Comments:: Wears rx glasses - up to date with routine eye exams with  Dr Dasie   Goals Addressed               This Visit's Progress     Increase physical activity (pt-stated)        Get more active.       Depression Screen     01/12/2024   10:06 AM 03/17/2023    1:51 PM 11/17/2022   11:23 AM 04/24/2022    1:26 PM 01/23/2022   11:05 AM 10/15/2021    9:10 AM 10/24/2020   10:40 AM  PHQ 2/9 Scores  PHQ - 2 Score 0 1 1 0 0 0 4  PHQ- 9 Score  2    2 7     Fall Risk     01/12/2024   10:08 AM 03/17/2023    1:51 PM 11/17/2022   11:17 AM 04/24/2022    1:26 PM 01/23/2022   11:05 AM  Fall Risk   Falls in the past year? 1 0 0 0 0  Number falls in past yr: 0 0 0 0 0  Injury with Fall? 0 0 0 0 0  Risk for fall due to : No Fall Risks  History of fall(s) No Fall Risks No Fall Risks  Follow up  Falls evaluation completed Falls evaluation completed Falls evaluation completed Falls evaluation completed  Falls evaluation completed      Data saved with a previous flowsheet row definition    MEDICARE RISK AT HOME:  Medicare Risk at Home Any stairs in or around the home?: Yes If so, are there any without handrails?: No Home free of loose throw rugs in walkways, pet beds, electrical cords, etc?: Yes Adequate  lighting in your home to reduce risk of falls?: Yes Life alert?: No Use of a cane, walker or w/c?: Yes Grab bars in the bathroom?: No Shower chair or bench in shower?: No Elevated toilet seat or a handicapped toilet?: No  TIMED UP AND GO:  Was the test performed?  No  Cognitive Function: 6CIT completed        01/12/2024   10:09 AM 11/17/2022   11:25 AM  6CIT Screen  What Year? 0 points 0 points  What month? 0 points 0 points  What time? 0 points 0 points  Count back from 20 0 points 0 points  Months in reverse 0 points 0 points  Repeat phrase 0 points 0 points  Total Score 0 points 0 points    Immunizations Immunization History  Administered Date(s) Administered   Influenza,inj,Quad PF,6+ Mos 04/09/2016, 02/02/2019, 02/20/2021   Influenza-Unspecified 02/03/2019, 01/30/2020, 03/19/2022, 02/21/2023   Janssen (J&J) SARS-COV-2 Vaccination 08/11/2019   PFIZER(Purple Top)SARS-COV-2 Vaccination 03/15/2020   Pfizer Covid-19 Vaccine Bivalent Booster 69yrs & up 03/05/2021   Pfizer(Comirnaty)Fall Seasonal Vaccine 12 years and older 03/19/2022, 02/21/2023   Pneumococcal Polysaccharide-23 03/03/2016   Tdap 02/25/2019   Zoster Recombinant(Shingrix) 03/19/2022    Screening Tests Health Maintenance  Topic Date Due   Diabetic kidney evaluation - Urine ACR  Never done   Pneumococcal Vaccine: 50+ Years (2 of 2 - PCV) 03/03/2017   Zoster Vaccines- Shingrix (2 of 2) 05/14/2022   DEXA SCAN  Never done   Influenza Vaccine  12/04/2023   COVID-19 Vaccine (6 - 2024-25 season) 01/04/2024   Diabetic kidney evaluation - eGFR measurement  03/16/2024   HEMOGLOBIN A1C  05/13/2024   OPHTHALMOLOGY EXAM  09/14/2024   Medicare Annual Wellness (AWV)  01/11/2025   MAMMOGRAM  06/17/2025   Colonoscopy  12/09/2026   DTaP/Tdap/Td (2 - Td or Tdap) 02/24/2029   Hepatitis C Screening  Completed   HIV Screening  Completed   Hepatitis B Vaccines 19-59 Average Risk  Aged Out   HPV VACCINES  Aged Out    Meningococcal B Vaccine  Aged Out   FOOT EXAM  Discontinued    Health Maintenance Items Addressed:   Additional Screening:  Vision Screening: Recommended annual ophthalmology exams for early detection of glaucoma and other disorders of the eye. Is the patient up to date with their annual eye exam?  Yes Who is the provider or what is the name of the office in which the patient attends annual eye exams? Dr Dasie  Dental Screening: Recommended annual dental exams for proper oral hygiene  Community Resource Referral / Chronic Care Management: CRR required this visit?  No   CCM required this visit?  No   Plan:    I have personally reviewed and noted the following in the patient's chart:   Medical and social history Use of alcohol , tobacco or illicit drugs  Current medications and supplements including opioid prescriptions. Patient is not currently taking opioid prescriptions. Functional ability and status Nutritional status Physical activity Advanced directives List of other physicians Hospitalizations, surgeries, and ER visits  in previous 12 months Vitals Screenings to include cognitive, depression, and falls Referrals and appointments  In addition, I have reviewed and discussed with patient certain preventive protocols, quality metrics, and best practice recommendations. A written personalized care plan for preventive services as well as general preventive health recommendations were provided to patient.   Rojelio LELON Blush, LPN   [id number]   After Visit Summary: (MyChart) Due to this being a telephonic visit, the after visit summary with patients personalized plan was offered to patient via MyChart   Notes: Nothing significant to report at this time.

## 2024-01-26 ENCOUNTER — Emergency Department (HOSPITAL_BASED_OUTPATIENT_CLINIC_OR_DEPARTMENT_OTHER)
Admission: EM | Admit: 2024-01-26 | Discharge: 2024-01-26 | Disposition: A | Discharge status: Home / Self Care | Attending: Emergency Medicine | Admitting: Emergency Medicine

## 2024-01-26 ENCOUNTER — Other Ambulatory Visit: Payer: Self-pay

## 2024-01-26 ENCOUNTER — Ambulatory Visit: Payer: Self-pay

## 2024-01-26 ENCOUNTER — Encounter (HOSPITAL_BASED_OUTPATIENT_CLINIC_OR_DEPARTMENT_OTHER): Payer: Self-pay | Admitting: Urology

## 2024-01-26 DIAGNOSIS — Z794 Long term (current) use of insulin: Secondary | ICD-10-CM | POA: Diagnosis not present

## 2024-01-26 DIAGNOSIS — M25562 Pain in left knee: Secondary | ICD-10-CM | POA: Insufficient documentation

## 2024-01-26 DIAGNOSIS — Z87891 Personal history of nicotine dependence: Secondary | ICD-10-CM | POA: Insufficient documentation

## 2024-01-26 DIAGNOSIS — I10 Essential (primary) hypertension: Secondary | ICD-10-CM | POA: Insufficient documentation

## 2024-01-26 DIAGNOSIS — Z7984 Long term (current) use of oral hypoglycemic drugs: Secondary | ICD-10-CM | POA: Diagnosis not present

## 2024-01-26 DIAGNOSIS — Z79899 Other long term (current) drug therapy: Secondary | ICD-10-CM | POA: Insufficient documentation

## 2024-01-26 DIAGNOSIS — E119 Type 2 diabetes mellitus without complications: Secondary | ICD-10-CM | POA: Insufficient documentation

## 2024-01-26 DIAGNOSIS — Z91040 Latex allergy status: Secondary | ICD-10-CM | POA: Diagnosis not present

## 2024-01-26 MED ORDER — KETOROLAC TROMETHAMINE 15 MG/ML IJ SOLN
15.0000 mg | Freq: Once | INTRAMUSCULAR | Status: AC
  Administered 2024-01-26: 15 mg via INTRAMUSCULAR
  Filled 2024-01-26: qty 1

## 2024-01-26 NOTE — ED Provider Notes (Signed)
 [place] [facility] Provider Note   CSN: [id number] Arrival date & time: 01/26/24  1328     Patient presents with: Knee Pain   Tiffany Estrada is a 68 y.o. female with history of diabetes, hypertension presents with complaints of left knee pain.  Symptoms have been ongoing for the past 2 to 3 weeks.  No injury or trauma.  Reports that her knee swelled up.  Symptoms have since improved.  Pain is worse with weightbearing.  Reports that she has had similar symptoms in her contralateral knee in the past.  No fevers or chills.  No history of DVT, no recent surgeries, extended travel or hospitalization.   HPI    Past Medical History:  Diagnosis Date   Diabetes mellitus type 2 in obese 02/14/2016   Essential hypertension 02/04/2016   History of chicken pox    Hyperlipidemia    Neuromuscular disorder (HCC)    Obesity 02/04/2016   Tobacco abuse 02/14/2016   Past Surgical History:  Procedure Laterality Date   APPENDECTOMY     CHOLECYSTECTOMY     COLONOSCOPY  over 10 years ago   in [place]-normal exam   TOTAL ABDOMINAL HYSTERECTOMY       Prior to Admission medications   Medication Sig Start Date End Date Taking? Authorizing Provider  Alcohol  Swabs  PADS Use as directed 08/25/19   Frann Mabel Mt, DO  atorvastatin  (LIPITOR) 40 MG tablet Take 1 tablet (40 mg total) by mouth daily. 10/15/21   Frann Mabel Mt, DO  Blood Glucose Monitoring Suppl [facility] VERIO FLEX SYSTEM) w/Device KIT Check blood sugars twice daily 07/04/21   Frann Mabel Mt, DO  cholecalciferol (VITAMIN D3) 25 MCG (1000 UNIT) tablet Take 1,000 Units by mouth daily.    [provider]  Continuous Glucose Sensor (FREESTYLE LIBRE 3 PLUS SENSOR) MISC 1 Device by Other route every 14 (fourteen) days. Change sensor every 15 days. 11/11/23   Shamleffer, Ibtehal Jaralla, MD  Dulaglutide  (TRULICITY ) 1.5 MG/0.5ML SOAJ Inject 1.5 mg into the skin once a week.  11/11/23   Shamleffer, Ibtehal Jaralla, MD  gabapentin (NEURONTIN) 300 MG capsule  05/02/19   [provider]  glucose blood (ONETOUCH VERIO) test strip USE 1 TEST STRIP TO CHECK BLOOD SUGAR TWICE WEEKLY 09/29/23   Wendling, Mabel Mt, DO  insulin  degludec (TRESIBA  FLEXTOUCH) 100 UNIT/ML FlexTouch Pen Inject 24 Units into the skin daily. 11/11/23   Shamleffer, Ibtehal Jaralla, MD  Insulin  Pen Needle (PEN NEEDLES) 32G X 4 MM MISC 1 Device by Does not apply route daily in the afternoon. 12/12/22   Shamleffer, Ibtehal Jaralla, MD  levocetirizine (XYZAL ) 5 MG tablet Take 1 tablet (5 mg total) by mouth every evening. 10/24/20   Frann, Mabel Mt, DO  metFORMIN  (GLUCOPHAGE -XR) 500 MG 24 hr tablet Take 2 tablets (1,000 mg total) by mouth daily with breakfast. 11/11/23   Shamleffer, Ibtehal Jaralla, MD  Multiple Vitamins-Minerals (MULTI ADULT GUMMIES PO) Take by mouth. Take 1 gummies by mouth daily.    [provider]  Olmesartan -amLODIPine -HCTZ 40-5-25 MG TABS Take 1 tablet by mouth daily. 04/24/23   Frann Mabel Mt, DO  Omega-3 1000 MG CAPS Take by mouth.    [provider]  OneTouch Delica Lancets 33G MISC Use twice daily to check blood sugar.  DXE11.9 06/07/21   Frann Mabel Mt, DO  ONETOUCH DELICA LANCETS FINE MISC Use to check sugars twice weekly. 03/03/16   Frann Mabel Mt, DO  pioglitazone  (ACTOS )  30 MG tablet Take 1 tablet (30 mg total) by mouth daily. 11/11/23   Shamleffer, Ibtehal Jaralla, MD  terconazole  (TERAZOL 3 ) 0.8 % vaginal cream Place 1 applicator vaginally at bedtime. Apply nightly for three nights. 04/24/22   Frann Mabel Mt, DO  triamcinolone  cream (KENALOG ) 0.1 % Apply 1 Application topically 2 (two) times daily. 10/14/23   Frann Mabel Mt, DO    Allergies: Latex, Penicillins, Red dye #40 (allura red), Shellfish allergy, and Sulfa antibiotics    Review of Systems  Musculoskeletal:  Positive for arthralgias.    Updated  Vital Signs BP 138/80 (BP Location: Right Arm)   Pulse 92   Temp 98.1 F (36.7 C) (Oral)   Resp 18   Ht 5' 6 (1.676 m)   Wt 83.9 kg   SpO2 99%   BMI 29.85 kg/m   Physical Exam Vitals and nursing note reviewed.  Constitutional:      General: She is not in acute distress.    Appearance: She is well-developed.  HENT:     Head: Normocephalic and atraumatic.  Eyes:     Conjunctiva/sclera: Conjunctivae normal.  Cardiovascular:     Rate and Rhythm: Normal rate and regular rhythm.     Heart sounds: No murmur heard. Pulmonary:     Effort: Pulmonary effort is normal. No respiratory distress.     Breath sounds: Normal breath sounds.  Abdominal:     Palpations: Abdomen is soft.     Tenderness: There is no abdominal tenderness.  Musculoskeletal:     Cervical back: Neck supple.     Comments: Trace edema involving the left knee, no significant warmth or focal tenderness, knee is stable to varus and valgus.  Negative Homans, DP/PT pulses 2+, compartments are soft, tolerates full range of motion of the knee without discomfort, 5 out of 5 strength, sensation intact  Skin:    General: Skin is warm and dry.     Capillary Refill: Capillary refill takes less than 2 seconds.  Neurological:     Mental Status: She is alert.  Psychiatric:        Mood and Affect: Mood normal.     (all labs ordered are listed, but only abnormal results are displayed) Labs Reviewed - No data to display  EKG: None  Radiology: No results found.   Procedures   Medications Ordered in the ED  ketorolac  (TORADOL ) 15 MG/ML injection 15 mg (has no administration in time range)    Clinical Course as of 01/26/24 1421  Tue Jan 26, 2024  1419 Patient evaluated for atraumatic left knee pain for the past 2 to 3 weeks.  Has had similar symptoms in the contralateral knee.  Prior films demonstrate arthritic changes.  She is hemodynamically stable.  On exam she has crepitus with range of motion.  Tolerates full range  of motion without discomfort.  There is trace edema without erythema or significant warmth.  No focal tenderness.  Overall most concerning for musculoskeletal etiology such as arthritis.  Do not feel that any labs or imaging are urgently indicated today.  Patient is interested in a single shot of Toradol , knee brace and Ortho follow-up.  Strict return precautions provided including worsening pain, swelling, fevers or chills. [JT]    Clinical Course User Index [JT] Donnajean Lynwood DEL, PA-C                                 Medical  Decision Making  This patient presents to the ED with chief complaint(s) of knee pain.  The complaint involves an extensive differential diagnosis and also carries with it a high risk of complications and morbidity.   Pertinent past medical history as listed in HPI  The differential diagnosis includes  Exam is not consistent with septic joint, gout.  She has no risk factors and exam is not consistent with DVT.  No injury or trauma to be concerned about fracture or dislocation.  Additional history obtained: Records reviewed Care Everywhere/External Records  Disposition:   Patient will be discharged home. The patient has been appropriately medically screened and/or stabilized in the ED. I have low suspicion for any other emergent medical condition which would require further screening, evaluation or treatment in the ED or require inpatient management. At time of discharge the patient is hemodynamically stable and in no acute distress. I have discussed work-up results and diagnosis with patient and answered all questions. Patient is agreeable with discharge plan. We discussed strict return precautions for returning to the emergency department and they verbalized understanding.     Social Determinants of Health:   none  This note was dictated with voice recognition software.  Despite best efforts at proofreading, errors may have occurred which can change the documentation  meaning.       Final diagnoses:  Left knee pain, unspecified chronicity    ED Discharge Orders     None          Arnisha Laffoon H, PA-C 01/26/24 1421    Freddi Hamilton, MD 01/27/24 [phone]

## 2024-01-26 NOTE — Telephone Encounter (Signed)
 FYI Only or Action Required?: Action required by provider: request for appointment.  Patient was last seen in primary care on 10/14/2023 by Frann Mabel Mt, DO.  Called Nurse Triage reporting Knee Pain.  Symptoms began a week ago.  Interventions attempted: OTC medications: ibuprofen .  Symptoms are: unchanged.  Triage Disposition: See HCP Within 4 Hours (Or PCP Triage)  Patient/caregiver understands and will follow disposition?: Yes  Copied from CRM #[id number]. Topic: Clinical - Red Word Triage >> Jan 26, 2024 11:23 AM Franky GRADE wrote: Red Word that prompted transfer to Nurse Triage: Patient is experiencing Bilateral knee swelling and burning sensation for about two weeks now. Reason for Disposition  [1] Thigh or calf pain AND [2] only 1 side AND [3] present > 1 hour    Nurse judgement, advised ED for left calf pain, warm to touch, swollen, diff walking.  Answer Assessment - Initial Assessment Questions Advised ED. Pt reports will go to [facility] ED.  1. LOCATION and RADIATION: Where is the pain located?      Bilateral knee pain, swelling, burning sensation/heat, toes feel tingling and numbness.  Left leg muscle swollen than right, constant pain, hard to walk, warm to touch, hard to scretch or turn on the side; been going on 2 weeks now, 8/10.  2. QUALITY: What does the pain feel like?  (e.g., sharp, dull, aching, burning)     burning 3. SEVERITY: How bad is the pain? What does it keep you from doing?   (Scale 1-10; or mild, moderate, severe)     8/10 4. ONSET: When did the pain start? Does it come and go, or is it there all the time?     2 weeks ago 5. RECURRENT: Have you had this pain before? If Yes, ask: When, and what happened then?     no  7. AGGRAVATING FACTORS: What makes the knee pain worse? (e.g., walking, climbing stairs, running)     Hurts when walking 8. ASSOCIATED SYMPTOMS: Is there any swelling or redness of the knee?     Swelling of  knee 9. OTHER SYMPTOMS: Do you have any other symptoms? (e.g., calf pain, chest pain, difficulty breathing, fever)     Left calf pain, denies diff breathing, chest pain, fever  Last medication Prednisone  for knee pain, effective but pain returned  Protocols used: Knee Pain-A-AH

## 2024-01-26 NOTE — Discharge Instructions (Addendum)
 You were evaluated in the emergency room for left knee pain and swelling.  Your exam is most consistent with arthritis.  You were provided a referral for orthopedics.  Please call make an appointment at your earliest convenience.  If you experience worsening pain, swelling, fevers or chills chest pain or shortness of breath please return to the emergency room.

## 2024-01-26 NOTE — ED Triage Notes (Signed)
 Pt ambulatory to triage with left knee pain that started swelling about 2.5 weeks ago  Pain with weight bearing  Denies known injury

## 2024-01-26 NOTE — Telephone Encounter (Signed)
FYI. Pt going to ED.  

## 2024-01-27 DIAGNOSIS — R2242 Localized swelling, mass and lump, left lower limb: Secondary | ICD-10-CM | POA: Diagnosis not present

## 2024-02-03 ENCOUNTER — Telehealth: Payer: Self-pay

## 2024-02-03 ENCOUNTER — Other Ambulatory Visit: Payer: Self-pay

## 2024-02-03 DIAGNOSIS — M25561 Pain in right knee: Secondary | ICD-10-CM

## 2024-02-03 NOTE — Telephone Encounter (Signed)
 Called pt was advised referral has been placed, Diabetes check scheduled.

## 2024-02-03 NOTE — Telephone Encounter (Signed)
 Pt requesting referral to ortho ([facility] [place]) for knee pain. Okay to place referral?

## 2024-02-03 NOTE — Telephone Encounter (Signed)
 Copied from CRM #[id number]. Topic: Referral - Request for Referral >> Feb 03, 2024  2:28 PM Ivette P wrote: Did the patient discuss referral with their provider in the last year? Yes (If No - schedule appointment) (If Yes - send message)  Appointment offered? No  Type of order/referral and detailed reason for visit: knee and back of knee. Ortho referral   Preference of office, provider, location:    [facility]  [address] [place], [place] [address] Phone: [phone]   If referral order, have you been seen by this specialty before? Yes (If Yes, this issue or another issue? When? Where?  Can we respond through MyChart? Yes

## 2024-02-03 NOTE — Telephone Encounter (Signed)
 That's fine. She is due for a 6 mo med ck with us . Plz sched. Thx.

## 2024-02-14 ENCOUNTER — Other Ambulatory Visit: Payer: Self-pay | Admitting: Internal Medicine

## 2024-02-16 NOTE — Progress Notes (Addendum)
 Tiffany Estrada                                          MRN: [id number]   02/16/2024   The [facility] Quality Team Specialist reviewed this patient medical record for the purposes of chart review for care gap closure. The following were reviewed: chart review for care gap closure-kidney health evaluation for diabetes:eGFR  and uACR. Appt 11/12 with endo for labs  05/09/2024- GSD out of range 2025    [facility] Quality Team

## 2024-02-18 ENCOUNTER — Other Ambulatory Visit: Payer: Self-pay | Admitting: Family Medicine

## 2024-02-18 DIAGNOSIS — I10 Essential (primary) hypertension: Secondary | ICD-10-CM

## 2024-03-16 ENCOUNTER — Encounter: Payer: Self-pay | Admitting: Internal Medicine

## 2024-03-16 ENCOUNTER — Ambulatory Visit: Admitting: Internal Medicine

## 2024-03-16 ENCOUNTER — Other Ambulatory Visit

## 2024-03-16 VITALS — BP 118/70 | Ht 66.0 in | Wt 175.0 lb

## 2024-03-16 DIAGNOSIS — E785 Hyperlipidemia, unspecified: Secondary | ICD-10-CM

## 2024-03-16 DIAGNOSIS — E1142 Type 2 diabetes mellitus with diabetic polyneuropathy: Secondary | ICD-10-CM | POA: Diagnosis not present

## 2024-03-16 DIAGNOSIS — Z794 Long term (current) use of insulin: Secondary | ICD-10-CM | POA: Diagnosis not present

## 2024-03-16 DIAGNOSIS — E1165 Type 2 diabetes mellitus with hyperglycemia: Secondary | ICD-10-CM

## 2024-03-16 LAB — POCT GLYCOSYLATED HEMOGLOBIN (HGB A1C): Hemoglobin A1C: 9.6 % — AB (ref 4.0–5.6)

## 2024-03-16 MED ORDER — OZEMPIC (0.25 OR 0.5 MG/DOSE) 2 MG/3ML ~~LOC~~ SOPN: 0.2500 mg | PEN_INJECTOR | SUBCUTANEOUS | 3 refills | Status: DC

## 2024-03-16 MED ORDER — METFORMIN HCL ER 500 MG PO TB24: 1000.0000 mg | ORAL_TABLET | Freq: Every day | ORAL | 3 refills | Status: AC

## 2024-03-16 MED ORDER — PEN NEEDLES 32G X 4 MM MISC
1.0000 | Freq: Every day | 3 refills | Status: AC
Start: 2024-03-16 — End: ?

## 2024-03-16 MED ORDER — PIOGLITAZONE HCL 30 MG PO TABS: 30.0000 mg | ORAL_TABLET | Freq: Every day | ORAL | 3 refills | Status: AC

## 2024-03-16 MED ORDER — TRESIBA FLEXTOUCH 100 UNIT/ML ~~LOC~~ SOPN: 28.0000 [IU] | PEN_INJECTOR | Freq: Every day | SUBCUTANEOUS | 2 refills | Status: DC

## 2024-03-16 NOTE — Patient Instructions (Addendum)
-   Continue Metformin  500 mg, 2 tablets daily  - Continue  Pioglitazone  (Actos )  30 mg , 1 tablet daily  - Increase Tresiba  28 units once daily  - Start Ozempic  0.25 mg weekly     -HOW TO TREAT LOW BLOOD SUGARS (Blood sugar LESS THAN 70 MG/DL) Please follow the RULE OF 15 for the treatment of hypoglycemia treatment (when your (blood sugars are less than 70 mg/dL)   STEP 1: Take 15 grams of carbohydrates when your blood sugar is low, which includes:  3-4 GLUCOSE TABS  OR 3-4 OZ OF JUICE OR REGULAR SODA OR ONE TUBE OF GLUCOSE GEL    STEP 2: RECHECK blood sugar in 15 MINUTES STEP 3: If your blood sugar is still low at the 15 minute recheck --> then, go back to STEP 1 and treat AGAIN with another 15 grams of carbohydrates.

## 2024-03-16 NOTE — Progress Notes (Unsigned)
 Name: Tiffany Estrada  Age/ Sex: 68 y.o., female   MRN/ DOB: [id number], 1955/05/29     PCP: Frann Mabel Mt, DO   Reason for Endocrinology Evaluation: Type 2 Diabetes Mellitus  Initial Endocrine Consultative Visit: 10/18/2019    PATIENT IDENTIFIER: Tiffany Estrada is a 68 y.o. female with a past medical history of T2DM, HTN and Dyslipidemia. The patient has followed with Endocrinology clinic since 10/18/2019 for consultative assistance with management of her diabetes.  DIABETIC HISTORY:  Tiffany Estrada was diagnosed with T2DM in 2017, trulicity  has been ineffective.  Her hemoglobin A1c has ranged from 7.5% in 2018, peaking at 11.9% in 2020.  On her initial visit to our clinic she had an A1c of 11.2%   , she was on Tresiba , and Metformin . We continued metformin  , switched basal insulin  to insulin  Mix.   Trulicity  started 06/2020  Started Farxiga  through PCP in 10/2020   Patient self decrease insulin  mix by skipping the second dose of insulin  makes February 2023, we switched her to basal insulin  and continued metformin , Trulicity , and Farxiga   with an A1c 6.2% ,but she lost insurance and was without medication with an increase in A1c 12.0% PCP restarted insulin  mix, metformin  and actos  by 04/2022  Switched insulin  mix to basal insulin  12/2022   Will decrease pioglitazone  due to lower extremity edema in July, 2025   I started her on Ozempic  in November, 2025 with an A1c of 9.6%, she was having nausea and vomiting and has not been taking her Trulicity .  SUBJECTIVE:   During the last visit (11/11/2023): A1c 7.5%.    Today (03/16/2024): Tiffany Estrada is here for a follow up on diabetes management.  She checks her blood sugars occasionally .   She continues to drink sugar- sweetened beverages  She has nausea and gagging reflux  No heartburn  Has mild constipation - on miralax   She has not been taking Trulicity  for over a month     HOME DIABETES REGIMEN:  Metformin  500 mg,  2 tablets daily Pioglitazone  30 mg daily Tresiba  24 units daily Trulicity  1.5 mg weekly- not taking      Statin: yes ACE-I/ARB:  losartan     METER DOWNLOAD SUMMARY:  This am 280 mg/dL    DIABETIC COMPLICATIONS: Microvascular complications:  Neuropathy Denies: CKD , retinopathy Last Eye Exam: Completed 06/2023  Macrovascular complications:   Denies: CAD, CVA, PVD   HISTORY:  Past Medical History:  Past Medical History:  Diagnosis Date   Diabetes mellitus type 2 in obese 02/14/2016   Essential hypertension 02/04/2016   History of chicken pox    Hyperlipidemia    Neuromuscular disorder (HCC)    Obesity 02/04/2016   Tobacco abuse 02/14/2016   Past Surgical History:  Past Surgical History:  Procedure Laterality Date   APPENDECTOMY     CHOLECYSTECTOMY     COLONOSCOPY  over 10 years ago   in [place]-normal exam   TOTAL ABDOMINAL HYSTERECTOMY     Social History:  reports that she has been smoking cigarettes. She has never used smokeless tobacco. She reports that she does not drink alcohol  and does not use drugs. Family History:  Family History  Problem Relation Age of Onset   Pancreatitis Mother    Diabetes Mother    Pancreatitis Sister    Pancreatitis Maternal Aunt    Pancreatitis Maternal Uncle    Colon cancer Neg Hx    Colon polyps Neg Hx    Esophageal cancer Neg  Hx    Rectal cancer Neg Hx    Stomach cancer Neg Hx      HOME MEDICATIONS: Allergies as of 03/16/2024       Reactions   Latex    Penicillins    Red Dye #40 (allura Red)    Shellfish Allergy    Sulfa Antibiotics         Medication List        Accurate as of March 16, 2024  7:45 AM. If you have any questions, ask your nurse or doctor.          Alcohol  Swabs  Pads Use as directed   atorvastatin  40 MG tablet Commonly known as: LIPITOR Take 1 tablet (40 mg total) by mouth daily.   cholecalciferol 25 MCG (1000 UNIT) tablet Commonly known as: VITAMIN D3 Take 1,000  Units by mouth daily.   FreeStyle Libre 3 Plus Sensor Misc 1 Device by Other route every 14 (fourteen) days. Change sensor every 15 days.   gabapentin 300 MG capsule Commonly known as: NEURONTIN   levocetirizine 5 MG tablet Commonly known as: XYZAL  Take 1 tablet (5 mg total) by mouth every evening.   metFORMIN  500 MG 24 hr tablet Commonly known as: GLUCOPHAGE -XR Take 2 tablets (1,000 mg total) by mouth daily with breakfast.   MULTI ADULT GUMMIES PO Take by mouth. Take 1 gummies by mouth daily.   Olmesartan -amLODIPine -HCTZ 40-5-25 MG Tabs Take 1 tablet by mouth daily.   Omega-3 1000 MG Caps Take by mouth.   OneTouch Delica Lancets Fine Misc Use to check sugars twice weekly.   OneTouch Delica Lancets 33G Misc Use twice daily to check blood sugar.  DXE11.9   OneTouch Verio Flex System w/Device Kit Check blood sugars twice daily   OneTouch Verio test strip Generic drug: glucose blood USE 1 TEST STRIP TO CHECK BLOOD SUGAR TWICE WEEKLY   Pen Needles 32G X 4 MM Misc 1 Device by Does not apply route daily in the afternoon.   pioglitazone  30 MG tablet Commonly known as: Actos  Take 1 tablet (30 mg total) by mouth daily.   terconazole  0.8 % vaginal cream Commonly known as: TERAZOL 3  Place 1 applicator vaginally at bedtime. Apply nightly for three nights.   Tresiba  FlexTouch 100 UNIT/ML FlexTouch Pen Generic drug: insulin  degludec Inject 24 Units into the skin daily.   triamcinolone  cream 0.1 % Commonly known as: KENALOG  Apply 1 Application topically 2 (two) times daily.   Trulicity  1.5 MG/0.5ML Soaj Generic drug: Dulaglutide  Inject 1.5 mg into the skin once a week.         OBJECTIVE:   Vital Signs: There were no vitals taken for this visit.  Wt Readings from Last 3 Encounters:  01/26/24 184 lb 15.5 oz (83.9 kg)  01/12/24 185 lb (83.9 kg)  11/11/23 185 lb (83.9 kg)     Exam: General: Pt appears well and is in NAD  Lungs: Clear with good BS bilat    Heart: RRR   Extremities: Trace pretibial edema   Neuro: MS is good with appropriate affect, pt is alert and Ox3   DM Foot Exam 06/15/2023   The skin of the feet is intact without sores or ulcerations.callous formation of the 5th toes , Has a right foot lateral skin lesion  The pedal pulses are 2+ on right and 2+ on left. The sensation is decreased to a screening 5.07, 10 gram monofilament on the right     DATA REVIEWED:  Lab Results  Component  Value Date   HGBA1C 7.5 (A) 11/11/2023   HGBA1C 8.2 (A) 06/15/2023   HGBA1C 8.4 (A) 12/12/2022     ASSESSMENT / PLAN / RECOMMENDATIONS:   1) Type 2 Diabetes Mellitus, poorly controlled, With neuropathic  complications - Most recent A1c of 9.6% %. Goal A1c <7.0 %.    - Patient with worsening glycemic control due to dietary indiscretions as well as medication nonadherence.  Patient does drink sodas sporadically, she had 1 this morning with a BG reading of 280 MGs/DL, she has not been taking Trulicity , for unknown reason -SGLT2 inhibitors have been cost prohibitive - Insurance did not cover Mounjaro , she is on Trulicity , unable to increase due to nausea in the mornings.  Since she has not been on the Trulicity , we have opted to try her on a small dose of Ozempic  to see if that would help with GI symptoms -I will increase her insulin  -I have attempted to prescribe freestyle libre in the past, but she has not been able to get it  MEDICATIONS:  Continue metformin  2 tablet daily Continue pioglitazone  30 mg daily Increase Tresiba  28 units daily  Start Ozempic  0.25 mg weekly   EDUCATION / INSTRUCTIONS: BG monitoring instructions: Patient is instructed to check her blood sugars 2 times a day, before breakfast and supper . Call [facility] if: BG persistently < 70 I reviewed the Rule of 15 for the treatment of hypoglycemia in detail with the patient. Literature supplied.   2) Diabetic complications:  Eye: Does not have  known diabetic retinopathy.  Neuro/ Feet: Does have known diabetic peripheral neuropathy .  Renal: Patient does not  have known baseline CKD. She   is not on an ACEI/ARB at present.   3)Dyslipidemia:  - She is not sure if she is taking atorvastatin  - Lipid panel today****   F/U in 4 months     Signed electronically by: Stefano Redgie Butts, MD  [facility]  [facility] [address] [place] [place], [place] [address] Phone: [phone] FAX: [phone]   CC: Frann Mabel Mt, DO [address] [place] [address] Phone: [phone]  Fax: [phone]  Return to Endocrinology clinic as below: Future Appointments  Date Time Provider Department Center  03/16/2024  2:00 PM Rochanda Harpham, Donell Redgie, MD [facility] None  05/16/2024 10:30 AM Frann Mabel Mt, DO [facility]  01/19/2025 10:10 AM [facility] VISIT 2 [facility] [place]

## 2024-03-17 LAB — MICROALBUMIN / CREATININE URINE RATIO
Creatinine, Urine: 170 mg/dL (ref 20–275)
Microalb Creat Ratio: 4 mg/g{creat} (ref <30)
Microalb, Ur: 0.6 mg/dL

## 2024-03-17 LAB — BASIC METABOLIC PANEL WITH GFR
BUN: 10 mg/dL (ref 7–25)
CO2: 26 mmol/L (ref 20–32)
Calcium: 10.2 mg/dL (ref 8.6–10.4)
Chloride: 101 mmol/L (ref 98–110)
Creat: 0.7 mg/dL (ref 0.50–1.05)
Glucose, Bld: 161 mg/dL — ABNORMAL HIGH (ref 65–99)
Potassium: 3.5 mmol/L (ref 3.5–5.3)
Sodium: 138 mmol/L (ref 135–146)
eGFR: 96 mL/min/1.73m2 (ref >=60)

## 2024-03-17 LAB — LIPID PANEL
Cholesterol: 172 mg/dL (ref <200)
HDL: 45 mg/dL — ABNORMAL LOW (ref >=50)
LDL Cholesterol (Calc): 103 mg/dL — ABNORMAL HIGH
Non-HDL Cholesterol (Calc): 127 mg/dL (ref <130)
Total CHOL/HDL Ratio: 3.8 (calc) (ref <5.0)
Triglycerides: 142 mg/dL (ref <150)

## 2024-03-18 ENCOUNTER — Ambulatory Visit: Payer: Self-pay | Admitting: Internal Medicine

## 2024-04-20 ENCOUNTER — Telehealth: Payer: Self-pay

## 2024-04-20 NOTE — Telephone Encounter (Signed)
 [employer] pharmacist called to make MD aware in 2026 patient will need an alternative to Tresiba  as it is will no longer be a part of the formulary. Pharmacist called to give alteratives.

## 2024-04-30 ENCOUNTER — Other Ambulatory Visit: Payer: Self-pay | Admitting: Internal Medicine

## 2024-04-30 MED ORDER — TOUJEO SOLOSTAR 300 UNIT/ML ~~LOC~~ SOPN: 28.0000 [IU] | PEN_INJECTOR | Freq: Every day | SUBCUTANEOUS | 4 refills | Status: AC

## 2024-05-16 ENCOUNTER — Ambulatory Visit: Admitting: Family Medicine

## 2024-05-16 ENCOUNTER — Encounter: Payer: Self-pay | Admitting: Family Medicine

## 2024-05-16 ENCOUNTER — Ambulatory Visit
Admission: RE | Admit: 2024-05-16 | Discharge: 2024-05-16 | Disposition: A | Source: Ambulatory Visit | Attending: Family Medicine

## 2024-05-16 VITALS — BP 128/74 | HR 91 | Temp 98.0°F | Resp 16 | Ht 66.0 in | Wt 175.2 lb

## 2024-05-16 DIAGNOSIS — M25562 Pain in left knee: Secondary | ICD-10-CM | POA: Diagnosis not present

## 2024-05-16 DIAGNOSIS — E1165 Type 2 diabetes mellitus with hyperglycemia: Secondary | ICD-10-CM | POA: Diagnosis not present

## 2024-05-16 DIAGNOSIS — Z794 Long term (current) use of insulin: Secondary | ICD-10-CM

## 2024-05-16 DIAGNOSIS — E785 Hyperlipidemia, unspecified: Secondary | ICD-10-CM

## 2024-05-16 DIAGNOSIS — G8929 Other chronic pain: Secondary | ICD-10-CM | POA: Diagnosis not present

## 2024-05-16 DIAGNOSIS — I10 Essential (primary) hypertension: Secondary | ICD-10-CM | POA: Diagnosis not present

## 2024-05-16 MED ORDER — ATORVASTATIN CALCIUM 40 MG PO TABS: 40.0000 mg | ORAL_TABLET | Freq: Every day | ORAL | 3 refills | Status: AC

## 2024-05-16 MED ORDER — TIRZEPATIDE 7.5 MG/0.5ML ~~LOC~~ SOAJ: 7.5000 mg | SUBCUTANEOUS | 0 refills | Status: AC

## 2024-05-16 MED ORDER — TIRZEPATIDE 2.5 MG/0.5ML ~~LOC~~ SOAJ: 2.5000 mg | SUBCUTANEOUS | 0 refills | Status: AC

## 2024-05-16 MED ORDER — TIRZEPATIDE 5 MG/0.5ML ~~LOC~~ SOAJ: 5.0000 mg | SUBCUTANEOUS | 0 refills | Status: AC

## 2024-05-16 NOTE — Progress Notes (Signed)
 Chief Complaint  Patient presents with   Diabetes    Diabetes Check    Subjective Tiffany Estrada is a 69 y.o. female who presents for hypertension follow up. She does monitor home blood pressures. Blood pressures ranging from 110's/70's on average. She is compliant with medications- olmesartan -amlodipine -hydrochlorothiazide 40-5-25 mg/d. Patient has these side effects of medication: none She is adhering to a healthy diet overall. Current exercise: walking No CP or SOB.   Dyslipidemia Patient presents for dyslipidemia follow up. Currently being treated with Lipitor 40 mg/d and compliance with treatment thus far has been good. She denies myalgias. Diet/exercise as above. The patient is not known to have coexisting coronary artery disease.  L knee pain and swelling over the past year. No inj or change in activity. Questionable gout on XR of R knee in 2016. No redness, bruising, catching/locking. Not terrible right now.    Past Medical History:  Diagnosis Date   Diabetes mellitus type 2 in obese 02/14/2016   Essential hypertension 02/04/2016   History of chicken pox    Hyperlipidemia    Neuromuscular disorder (HCC)    Obesity 02/04/2016   Tobacco abuse 02/14/2016    Exam BP 128/74 (BP Location: Left Arm, Patient Position: Sitting)   Pulse 91   Temp 98 F (36.7 C) (Oral)   Resp 16   Ht 5' 6 (1.676 m)   Wt 175 lb 3.2 oz (79.5 kg)   SpO2 100%   BMI 28.28 kg/m  General:  well developed, well nourished, in no apparent distress Heart: RRR, no bruits, no LE edema Lungs: clear to auscultation, no accessory muscle use Psych: well oriented with normal range of affect and appropriate judgment/insight  Essential hypertension  Dyslipidemia - Plan: atorvastatin  (LIPITOR) 40 MG tablet  Chronic pain of left knee - Plan: DG Knee Complete 4 Views Left  Type 2 diabetes mellitus with hyperglycemia, with long-term current use of insulin  (HCC) - Plan: tirzepatide  (MOUNJARO ) 2.5  MG/0.5ML Pen, tirzepatide  (MOUNJARO ) 5 MG/0.5ML Pen, tirzepatide  (MOUNJARO ) 7.5 MG/0.5ML Pen  Chronic, stable. Cont olmesartan -amlodipine -hydrochlorothiazide 40-5-25 mg/d. Counseled on diet and exercise. Chronic, stable. Cont Lipitor 40 mg/d.  Ck XR. Failed Trulicity . Did not want to take Ozempic  as she states there is a lawsuit against them. She never started. Will send in Mounjaro  and update endo if she is able to get it.  F/u in 6 mo. The patient voiced understanding and agreement to the plan.  Mabel Mt [place], DO 05/16/2024  10:48 AM

## 2024-05-16 NOTE — Patient Instructions (Addendum)
 Keep the diet clean and stay active.  Let me know if there are cost issues with the Mounjaro .   Please get your X-ray done in the basement of our [place] office located on: [address] [place], [place] [address]  You do not need an appointment for that location.   Let us  know if you need anything.  Knee Exercises It is normal to feel mild stretching, pulling, tightness, or discomfort as you do these exercises, but you should stop right away if you feel sudden pain or your pain gets worse.  STRETCHING AND RANGE OF MOTION EXERCISES  These exercises warm up your muscles and joints and improve the movement and flexibility of your knee. These exercises also help to relieve pain, numbness, and tingling. Exercise A: Knee Extension, Prone  Lie on your abdomen on a bed. Place your left / right knee just beyond the edge of the surface so your knee is not on the bed. You can put a towel under your left / right thigh just above your knee for comfort. Relax your leg muscles and allow gravity to straighten your knee. You should feel a stretch behind your left / right knee. Hold this position for 30 seconds. Scoot up so your knee is supported between repetitions. Repeat 2 times. Complete this stretch 3 times per week. Exercise B: Knee Flexion, Active     Lie on your back with both knees straight. If this causes back discomfort, bend your left / right knee so your foot is flat on the floor. Slowly slide your left / right heel back toward your buttocks until you feel a gentle stretch in the front of your knee or thigh. Hold this position for 30 seconds. Slowly slide your left / right heel back to the starting position. Repeat 2 times. Complete this exercise 3 times per week. Exercise C: Quadriceps, Prone     Lie on your abdomen on a firm surface, such as a bed or padded floor. Bend your left / right knee and hold your ankle. If you cannot reach your ankle or pant leg, loop a belt around your foot and  grab the belt instead. Gently pull your heel toward your buttocks. Your knee should not slide out to the side. You should feel a stretch in the front of your thigh and knee. Hold this position for 30 seconds. Repeat 2 times. Complete this stretch 3 times per week. Exercise D: Hamstring, Supine  Lie on your back. Loop a belt or towel over the ball of your left / right foot. The ball of your foot is on the walking surface, right under your toes. Straighten your left / right knee and slowly pull on the belt to raise your leg until you feel a gentle stretch behind your knee. Do not let your left / right knee bend while you do this. Keep your other leg flat on the floor. Hold this position for 30 seconds. Repeat 2 times. Complete this stretch 3 times per week. STRENGTHENING EXERCISES  These exercises build strength and endurance in your knee. Endurance is the ability to use your muscles for a long time, even after they get tired. Exercise E: Quadriceps, Isometric     Lie on your back with your left / right leg extended and your other knee bent. Put a rolled towel or small pillow under your knee if told by your health care provider. Slowly tense the muscles in the front of your left / right thigh. You  should see your kneecap slide up toward your hip or see increased dimpling just above the knee. This motion will push the back of the knee toward the floor. For 3 seconds, keep the muscle as tight as you can without increasing your pain. Relax the muscles slowly and completely. Repeat for 10 total reps Repeat 2 ti mes. Complete this exercise 3 times per week. Exercise F: Straight Leg Raises - Quadriceps  Lie on your back with your left / right leg extended and your other knee bent. Tense the muscles in the front of your left / right thigh. You should see your kneecap slide up or see increased dimpling just above the knee. Your thigh may even shake a bit. Keep these muscles tight as you raise your leg  4-6 inches (10-15 cm) off the floor. Do not let your knee bend. Hold this position for 3 seconds. Keep these muscles tense as you lower your leg. Relax your muscles slowly and completely after each repetition. 10 total reps. Repeat 2 times. Complete this exercise 3 times per week.  Exercise G: Hamstring Curls     If told by your health care provider, do this exercise while wearing ankle weights. Begin with 5 lb weights (optional). Then increase the weight by 1 lb (0.5 kg) increments. Do not wear ankle weights that are more than 20 lbs to start with. Lie on your abdomen with your legs straight. Bend your left / right knee as far as you can without feeling pain. Keep your hips flat against the floor. Hold this position for 3 seconds. Slowly lower your leg to the starting position. Repeat for 10 reps.  Repeat 2 times. Complete this exercise 3 times per week. Exercise H: Squats (Quadriceps)  Stand in front of a table, with your feet and knees pointing straight ahead. You may rest your hands on the table for balance but not for support. Slowly bend your knees and lower your hips like you are going to sit in a chair. Keep your weight over your heels, not over your toes. Keep your lower legs upright so they are parallel with the table legs. Do not let your hips go lower than your knees. Do not bend lower than told by your health care provider. If your knee pain increases, do not bend as low. Hold the squat position for 1 second. Slowly push with your legs to return to standing. Do not use your hands to pull yourself to standing. Repeat 2 times. Complete this exercise 3 times per week. Exercise I: Wall Slides (Quadriceps)     Lean your back against a smooth wall or door while you walk your feet out 18-24 inches (46-61 cm) from it. Place your feet hip-width apart. Slowly slide down the wall or door until your knees Repeat 2 times. Complete this exercise every other day. Exercise K: Straight  Leg Raises - Hip Abductors  Lie on your side with your left / right leg in the top position. Lie so your head, shoulder, knee, and hip line up. You may bend your bottom knee to help you keep your balance. Roll your hips slightly forward so your hips are stacked directly over each other and your left / right knee is facing forward. Leading with your heel, lift your top leg 4-6 inches (10-15 cm). You should feel the muscles in your outer hip lifting. Do not let your foot drift forward. Do not let your knee roll toward the ceiling. Hold this position for  3 seconds. Slowly return your leg to the starting position. Let your muscles relax completely after each repetition. 10 total reps. Repeat 2 times. Complete this exercise 3 times per week. Exercise J: Straight Leg Raises - Hip Extensors  Lie on your abdomen on a firm surface. You can put a pillow under your hips if that is more comfortable. Tense the muscles in your buttocks and lift your left / right leg about 4-6 inches (10-15 cm). Keep your knee straight as you lift your leg. Hold this position for 3 seconds. Slowly lower your leg to the starting position. Let your leg relax completely after each repetition. Repeat 2 times. Complete this exercise 3 times per week. Document Released: 03/05/2005 Document Revised: 01/14/2016 Document Reviewed: 02/25/2015 Elsevier Interactive Patient Education  2017 [employer].

## 2024-05-23 ENCOUNTER — Ambulatory Visit: Payer: Self-pay | Admitting: Family Medicine

## 2024-07-13 ENCOUNTER — Ambulatory Visit: Admitting: Internal Medicine

## 2024-11-11 ENCOUNTER — Encounter: Admitting: Family Medicine

## 2025-01-19 ENCOUNTER — Ambulatory Visit
